# Patient Record
Sex: Female | Born: 1945 | Race: Black or African American | Hispanic: No | Smoking: Former smoker
Health system: Southern US, Community
[De-identification: ages and names within clinical notes are randomized; demographics above are authoritative.]

## PROBLEM LIST (undated history)

## (undated) DIAGNOSIS — M109 Gout, unspecified: Secondary | ICD-10-CM

## (undated) DIAGNOSIS — D564 Hereditary persistence of fetal hemoglobin [HPFH]: Secondary | ICD-10-CM

## (undated) DIAGNOSIS — D571 Sickle-cell disease without crisis: Secondary | ICD-10-CM

## (undated) DIAGNOSIS — E790 Hyperuricemia without signs of inflammatory arthritis and tophaceous disease: Secondary | ICD-10-CM

## (undated) HISTORY — PX: TOTAL ABDOMINAL HYSTERECTOMY W/ BILATERAL SALPINGOOPHORECTOMY: SHX83

## (undated) HISTORY — PX: CATARACT EXTRACTION, BILATERAL: SHX1313

## (undated) HISTORY — PX: HERNIA REPAIR: SHX51

## (undated) HISTORY — PX: CHOLECYSTECTOMY: SHX55

## (undated) HISTORY — PX: ABDOMINAL HYSTERECTOMY: SHX81

---

## 2015-11-17 ENCOUNTER — Telehealth: Payer: Self-pay | Admitting: Hematology and Oncology

## 2015-11-17 NOTE — Telephone Encounter (Signed)
Lt regarding being referred to Sick[facility] cell clinic,  new pt appt date, and the contact number for the clinic

## 2015-11-29 ENCOUNTER — Telehealth: Payer: Self-pay | Admitting: *Deleted

## 2015-11-29 NOTE — Telephone Encounter (Signed)
"  I'm calling for [facility] to schedule appointment with Dr. Alvy Bimler."  Call transferred to ext [phone].

## 2015-12-02 ENCOUNTER — Encounter (HOSPITAL_COMMUNITY): Payer: Self-pay | Admitting: Emergency Medicine

## 2015-12-02 ENCOUNTER — Emergency Department (HOSPITAL_COMMUNITY): Payer: MEDICARE

## 2015-12-02 ENCOUNTER — Inpatient Hospital Stay (HOSPITAL_COMMUNITY)
Admission: EM | Admit: 2015-12-02 | Discharge: 2015-12-06 | DRG: 812 | Disposition: A | Discharge status: Home / Self Care | Payer: MEDICARE | Attending: Internal Medicine | Admitting: Internal Medicine

## 2015-12-02 DIAGNOSIS — D649 Anemia, unspecified: Secondary | ICD-10-CM | POA: Diagnosis not present

## 2015-12-02 DIAGNOSIS — E875 Hyperkalemia: Secondary | ICD-10-CM | POA: Diagnosis present

## 2015-12-02 DIAGNOSIS — J9621 Acute and chronic respiratory failure with hypoxia: Secondary | ICD-10-CM | POA: Diagnosis not present

## 2015-12-02 DIAGNOSIS — R05 Cough: Secondary | ICD-10-CM

## 2015-12-02 DIAGNOSIS — R0609 Other forms of dyspnea: Secondary | ICD-10-CM | POA: Diagnosis not present

## 2015-12-02 DIAGNOSIS — N179 Acute kidney failure, unspecified: Secondary | ICD-10-CM | POA: Diagnosis present

## 2015-12-02 DIAGNOSIS — R0902 Hypoxemia: Secondary | ICD-10-CM | POA: Diagnosis present

## 2015-12-02 DIAGNOSIS — R748 Abnormal levels of other serum enzymes: Secondary | ICD-10-CM

## 2015-12-02 DIAGNOSIS — Z79899 Other long term (current) drug therapy: Secondary | ICD-10-CM

## 2015-12-02 DIAGNOSIS — D57819 Other sickle-cell disorders with crisis, unspecified: Secondary | ICD-10-CM | POA: Diagnosis not present

## 2015-12-02 DIAGNOSIS — D571 Sickle-cell disease without crisis: Secondary | ICD-10-CM | POA: Diagnosis present

## 2015-12-02 DIAGNOSIS — J9601 Acute respiratory failure with hypoxia: Secondary | ICD-10-CM | POA: Diagnosis not present

## 2015-12-02 DIAGNOSIS — D5700 Hb-SS disease with crisis, unspecified: Principal | ICD-10-CM | POA: Diagnosis present

## 2015-12-02 DIAGNOSIS — R0600 Dyspnea, unspecified: Secondary | ICD-10-CM | POA: Diagnosis not present

## 2015-12-02 DIAGNOSIS — D638 Anemia in other chronic diseases classified elsewhere: Secondary | ICD-10-CM | POA: Diagnosis not present

## 2015-12-02 HISTORY — DX: Gout, unspecified: M10.9

## 2015-12-02 HISTORY — DX: Sickle-cell disease without crisis: D57.1

## 2015-12-02 HISTORY — DX: Hyperuricemia without signs of inflammatory arthritis and tophaceous disease: E79.0

## 2015-12-02 HISTORY — DX: Hereditary persistence of fetal hemoglobin (HPFH): D56.4

## 2015-12-02 LAB — COMPREHENSIVE METABOLIC PANEL
ALBUMIN: 4 g/dL (ref 3.5–5.0)
ALK PHOS: 117 U/L (ref 38–126)
ALT: 23 U/L (ref 14–54)
AST: 41 U/L (ref 15–41)
Anion gap: 6 (ref 5–15)
BUN: 36 mg/dL — ABNORMAL HIGH (ref 6–20)
CALCIUM: 9.4 mg/dL (ref 8.9–10.3)
CHLORIDE: 116 mmol/L — AB (ref 101–111)
CO2: 17 mmol/L — AB (ref 22–32)
CREATININE: 1.31 mg/dL — AB (ref 0.44–1.00)
GFR calc Af Amer: 47 mL/min — ABNORMAL LOW (ref >60)
GFR calc non Af Amer: 40 mL/min — ABNORMAL LOW (ref >60)
GLUCOSE: 103 mg/dL — AB (ref 65–99)
Potassium: 4.3 mmol/L (ref 3.5–5.1)
SODIUM: 139 mmol/L (ref 135–145)
Total Bilirubin: 5.2 mg/dL — ABNORMAL HIGH (ref 0.3–1.2)
Total Protein: 6.2 g/dL — ABNORMAL LOW (ref 6.5–8.1)

## 2015-12-02 LAB — CBC WITH DIFFERENTIAL/PLATELET
BASOS PCT: 1 %
Basophils Absolute: 0.1 10*3/uL (ref 0.0–0.1)
EOS PCT: 3 %
Eosinophils Absolute: 0.3 10*3/uL (ref 0.0–0.7)
HEMATOCRIT: 17.2 % — AB (ref 36.0–46.0)
HEMOGLOBIN: 6.3 g/dL — AB (ref 12.0–15.0)
LYMPHS PCT: 28 %
Lymphs Abs: 2.8 10*3/uL (ref 0.7–4.0)
MCH: 33.5 pg (ref 26.0–34.0)
MCHC: 36.2 g/dL — ABNORMAL HIGH (ref 30.0–36.0)
MCV: 93 fL (ref 78.0–100.0)
MONO ABS: 1.3 10*3/uL — AB (ref 0.1–1.0)
Monocytes Relative: 13 %
NRBC: 11 /100{WBCs} — AB
Neutro Abs: 5.6 10*3/uL (ref 1.7–7.7)
Neutrophils Relative %: 55 %
Platelets: 217 10*3/uL (ref 150–400)
RBC: 1.85 MIL/uL — ABNORMAL LOW (ref 3.87–5.11)
RDW: 27.8 % — AB (ref 11.5–15.5)
WBC: 10.1 10*3/uL (ref 4.0–10.5)

## 2015-12-02 LAB — URINALYSIS, ROUTINE W REFLEX MICROSCOPIC
BILIRUBIN URINE: NEGATIVE
Glucose, UA: NEGATIVE mg/dL
Ketones, ur: NEGATIVE mg/dL
LEUKOCYTES UA: NEGATIVE
NITRITE: NEGATIVE
PH: 5.5 (ref 5.0–8.0)
Protein, ur: 100 mg/dL — AB
SPECIFIC GRAVITY, URINE: 1.012 (ref 1.005–1.030)

## 2015-12-02 LAB — I-STAT CG4 LACTIC ACID, ED: Lactic Acid, Venous: 0.52 mmol/L (ref 0.5–1.9)

## 2015-12-02 LAB — RETICULOCYTES: RBC.: 1.86 MIL/uL — ABNORMAL LOW (ref 3.87–5.11)

## 2015-12-02 LAB — URINE MICROSCOPIC-ADD ON: WBC, UA: NONE SEEN WBC/hpf (ref 0–5)

## 2015-12-02 LAB — PREPARE RBC (CROSSMATCH)

## 2015-12-02 MED ORDER — MORPHINE SULFATE (PF) 4 MG/ML IV SOLN
4.0000 mg | Freq: Once | INTRAVENOUS | Status: AC
  Administered 2015-12-02: 4 mg via INTRAVENOUS
  Filled 2015-12-02: qty 1

## 2015-12-02 MED ORDER — ALLOPURINOL 100 MG PO TABS
200.0000 mg | ORAL_TABLET | Freq: Every day | ORAL | Status: DC
Start: 2015-12-03 — End: 2015-12-06
  Administered 2015-12-03 – 2015-12-06 (×4): 200 mg via ORAL
  Filled 2015-12-02 (×5): qty 2

## 2015-12-02 MED ORDER — SODIUM CHLORIDE 0.9 % IV SOLN
Freq: Once | INTRAVENOUS | Status: AC
  Administered 2015-12-02: via INTRAVENOUS

## 2015-12-02 MED ORDER — SODIUM CHLORIDE 0.9 % IV SOLN
INTRAVENOUS | Status: DC
  Administered 2015-12-02: 18:00:00 via INTRAVENOUS

## 2015-12-02 MED ORDER — ASPIRIN EC 81 MG PO TBEC
81.0000 mg | DELAYED_RELEASE_TABLET | Freq: Once | ORAL | Status: AC
Start: 2015-12-02 — End: 2015-12-02
  Administered 2015-12-02: 81 mg via ORAL
  Filled 2015-12-02: qty 1

## 2015-12-02 MED ORDER — FOLIC ACID 1 MG PO TABS
1.0000 mg | ORAL_TABLET | Freq: Every day | ORAL | Status: DC
  Administered 2015-12-03 – 2015-12-06 (×4): 1 mg via ORAL
  Filled 2015-12-02 (×4): qty 1

## 2015-12-02 MED ORDER — HYDROMORPHONE HCL 1 MG/ML IJ SOLN
1.0000 mg | Freq: Once | INTRAMUSCULAR | Status: AC
Start: 2015-12-02 — End: 2015-12-02
  Administered 2015-12-02: 1 mg via INTRAVENOUS
  Filled 2015-12-02: qty 1

## 2015-12-02 MED ORDER — POLYETHYLENE GLYCOL 3350 17 G PO PACK: 17.0000 g | PACK | Freq: Every day | ORAL | Status: DC | PRN

## 2015-12-02 MED ORDER — GUAIFENESIN ER 600 MG PO TB12
600.0000 mg | ORAL_TABLET | Freq: Two times a day (BID) | ORAL | Status: DC
  Administered 2015-12-02 – 2015-12-06 (×8): 600 mg via ORAL
  Filled 2015-12-02 (×8): qty 1

## 2015-12-02 MED ORDER — INDOMETHACIN 25 MG PO CAPS
25.0000 mg | ORAL_CAPSULE | Freq: Three times a day (TID) | ORAL | Status: DC | PRN
  Filled 2015-12-02: qty 1

## 2015-12-02 MED ORDER — DEFERASIROX 360 MG PO TABS: 720.0000 mg | ORAL_TABLET | Freq: Every day | ORAL | Status: DC

## 2015-12-02 MED ORDER — DEFERASIROX 360 MG PO TABS
720.0000 mg | ORAL_TABLET | Freq: Every day | ORAL | Status: DC
  Administered 2015-12-04 – 2015-12-06 (×3): 720 mg via ORAL

## 2015-12-02 MED ORDER — SENNOSIDES-DOCUSATE SODIUM 8.6-50 MG PO TABS: 1.0000 | ORAL_TABLET | Freq: Two times a day (BID) | ORAL | Status: DC

## 2015-12-02 MED ORDER — DEFERASIROX 180 MG PO TABS
180.0000 mg | ORAL_TABLET | Freq: Every day | ORAL | Status: DC
  Administered 2015-12-04 – 2015-12-06 (×3): 180 mg via ORAL

## 2015-12-02 MED ORDER — OXYCODONE HCL ER 20 MG PO T12A
40.0000 mg | EXTENDED_RELEASE_TABLET | Freq: Two times a day (BID) | ORAL | Status: DC
  Administered 2015-12-02 – 2015-12-06 (×8): 40 mg via ORAL
  Filled 2015-12-02 (×8): qty 2

## 2015-12-02 MED ORDER — ADULT MULTIVITAMIN W/MINERALS CH
1.0000 | ORAL_TABLET | Freq: Every day | ORAL | Status: DC
  Administered 2015-12-03 – 2015-12-06 (×4): 1 via ORAL
  Filled 2015-12-02 (×4): qty 1

## 2015-12-02 MED ORDER — HYDROMORPHONE HCL 1 MG/ML IJ SOLN
1.0000 mg | INTRAMUSCULAR | Status: DC | PRN
Start: 2015-12-02 — End: 2015-12-03
  Administered 2015-12-02 – 2015-12-03 (×4): 1 mg via INTRAVENOUS
  Filled 2015-12-02 (×4): qty 1

## 2015-12-02 MED ORDER — ALBUTEROL SULFATE (2.5 MG/3ML) 0.083% IN NEBU
5.0000 mg | INHALATION_SOLUTION | Freq: Once | RESPIRATORY_TRACT | Status: DC
  Filled 2015-12-02 (×2): qty 6

## 2015-12-02 MED ORDER — SENNOSIDES-DOCUSATE SODIUM 8.6-50 MG PO TABS
1.0000 | ORAL_TABLET | Freq: Two times a day (BID) | ORAL | Status: DC
  Administered 2015-12-02 – 2015-12-06 (×8): 1 via ORAL
  Filled 2015-12-02 (×8): qty 1

## 2015-12-02 NOTE — ED Notes (Signed)
SPOKE WITH THE LABORATORY, THE PT'S BLOOD HAS ANTIBODIES, AND THE BLOOD WILL TAKE SOME TIME TO BE READY. ERA MADE AWARE.

## 2015-12-02 NOTE — ED Notes (Signed)
BLOOD CONSENT SIGNED AND WITNESSED.

## 2015-12-02 NOTE — ED Provider Notes (Signed)
CSN: [id number]     Arrival date & time 12/02/15  1300 History   First MD Initiated Contact with Patient 12/02/15 1324     Chief Complaint  Patient presents with  . Sickle Cell Pain Crisis  . Shortness of Breath     (Consider location/radiation/quality/duration/timing/severity/associated sxs/prior Treatment) HPI Comments: 70 year old female with history of fetal sickle cell disease presents complaining of shortness of breath 3 days. Patient has a history of anemia and states that her normal hemoglobin is about 8. She gets transfused about once every 6 weeks. Patient last transfused on May 9 due to hemoglobin of 6.4. She received 2 units at that time. States that she has lots  of antibodies. Denies any cough or congestion. No fever or chills. No abdominal discomfort. Does have some lower back discomfort as well as chest tightness. No medication taken for this prior to arrival  Patient is a 70 y.o. female presenting with sickle cell pain and shortness of breath. The history is provided by the patient and a relative.  Sickle Cell Pain Crisis Associated symptoms: shortness of breath   Shortness of Breath   Past Medical History  Diagnosis Date  . Sickle cell anemia (HCC)   . Gout    History reviewed. No pertinent past surgical history. No family history on file. Social History  Substance Use Topics  . Smoking status: Never Smoker   . Smokeless tobacco: None  . Alcohol Use: No   OB History    No data available     Review of Systems  Respiratory: Positive for shortness of breath.   All other systems reviewed and are negative.     Allergies  Review of patient's allergies indicates no known allergies.  Home Medications   Prior to Admission medications   Not on File   BP 138/57 mmHg  Pulse 100  Temp(Src) 99.2 F (37.3 C) (Oral)  Resp 26  SpO2 92% Physical Exam  Constitutional: She is oriented to person, place, and time. She appears well-developed and well-nourished.   Non-toxic appearance. No distress.  HENT:  Head: Normocephalic and atraumatic.  Eyes: Conjunctivae, EOM and lids are normal. Pupils are equal, round, and reactive to light.  Neck: Normal range of motion. Neck supple. No tracheal deviation present. No thyroid mass present.  Cardiovascular: Normal rate, regular rhythm and normal heart sounds.  Exam reveals no gallop.   No murmur heard. Pulmonary/Chest: Effort normal and breath sounds normal. No stridor. No respiratory distress. She has no decreased breath sounds. She has no wheezes. She has no rhonchi. She has no rales.  Abdominal: Soft. Normal appearance and bowel sounds are normal. She exhibits no distension. There is no tenderness. There is no rebound and no CVA tenderness.  Musculoskeletal: Normal range of motion. She exhibits no edema or tenderness.  Neurological: She is alert and oriented to person, place, and time. She has normal strength. No cranial nerve deficit or sensory deficit. GCS eye subscore is 4. GCS verbal subscore is 5. GCS motor subscore is 6.  Skin: Skin is warm and dry. No abrasion and no rash noted.  Psychiatric: She has a normal mood and affect. Her speech is normal and behavior is normal.  Nursing note and vitals reviewed.   ED Course  Procedures (including critical care time) Labs Review Labs Reviewed  CULTURE, BLOOD (ROUTINE X 2)  CULTURE, BLOOD (ROUTINE X 2)  URINE CULTURE  COMPREHENSIVE METABOLIC PANEL  CBC WITH DIFFERENTIAL/PLATELET  URINALYSIS, ROUTINE W REFLEX MICROSCOPIC (NOT AT  [facility])  I-STAT CG4 LACTIC ACID, ED  TYPE AND SCREEN    Imaging Review No results found. I have personally reviewed and evaluated these images and lab results as part of my medical decision-making.   EKG Interpretation   Date/Time:  Saturday December 02 2015 13:18:41 EDT Ventricular Rate:  94 PR Interval:    QRS Duration: 81 QT Interval:  372 QTC Calculation: 466 R Axis:   44 Text Interpretation:  Sinus rhythm Borderline T  abnormalities, anterior  leads Confirmed by Zenia Resides  MD, Tysean Vandervliet ([id number]) on 12/02/2015 2:02:41 PM      MDM   Final diagnoses:  None    Patient given IV morphine 2. Blood transfusions ordered. Will be admitted to the medicine service    Lacretia Leigh, MD 12/02/15 [phone]

## 2015-12-02 NOTE — ED Notes (Signed)
Patient transported to X-ray 

## 2015-12-02 NOTE — ED Notes (Signed)
Pt cannot use restroom at this time, aware urine specimen is needed.  

## 2015-12-02 NOTE — H&P (Signed)
History and Physical  Chelsea Hensley IHK:[id number] DOB: [birth year] DOA: 12/02/2015  Referring physician: EDP PCP: No primary care provider on file.   Chief Complaint: sickle cell crisis  HPI: Chelsea Hensley is a 70 y.o. female  Who was diagnosed with sickle cell disease when she was 34months old, she report she was last transfused on 5/9 when her hgb was 6.4. Today she presented to [facility] ED due to c/o sob for the last 3 days and low back pain, , oxycontin $RemoveBef'80mg'OzHHPqmEVr$  bid listed on her med lists, she reported she does not take it daily, she only take half a pill when she is in a lot of pain, she has not take any pain meds in the last three days, she report she has sickle cell crisis about 2-3 times a year and rarely needing pca pump. She does report started to have intermittent cough for the last week, denies sick contact, no fever. She is a life long nonsmoker, she is uptodate with her vaccinations.   ED course: cxr no acute findings, her o2 sats is in the low 90's, she does not appear in any distress, labs significant for hgb 6.3. Cr 1.31, tbili 5.2, retic 23, ua no infection, no prior baseline lab. She received iv morphin, two units of prbc ordered, hospitalist called to admit the patient.   Review of Systems:  Detail per HPI, Review of systems are otherwise negative  Past Medical History  Diagnosis Date  . Sickle cell anemia (HCC)   . Gout    History reviewed. No pertinent past surgical history. Social History:  reports that she has never smoked. She does not have any smokeless tobacco history on file. She reports that she does not drink alcohol or use illicit drugs. Patient lives at home & is able to participate in activities of daily living independently   Allergies  Allergen Reactions  . Quinolones Anaphylaxis    All vitals and pressures dropped, blacked out and had seizures    No family history on file.    Prior to Admission medications   Medication Sig Start Date End Date Taking?  Authorizing Provider  allopurinol (ZYLOPRIM) 100 MG tablet Take 200 mg by mouth daily.   Yes Historical Provider, MD  Deferasirox (JADENU) 180 MG TABS Take 180 mg by mouth daily. Take along with two 360 mg tablets to equal a total dose of 900 mg.   Yes Historical Provider, MD  Deferasirox (JADENU) 360 MG TABS Take 720 mg by mouth daily. Take with 180 mg tablet to equal a total dose of 900 mg.   Yes Historical Provider, MD  folic acid (FOLVITE) 1 MG tablet Take 1 mg by mouth daily.   Yes Historical Provider, MD  furosemide (LASIX) 20 MG tablet Take 20 mg by mouth daily as needed for fluid.   Yes Historical Provider, MD  indomethacin (INDOCIN) 25 MG capsule Take 25 mg by mouth 3 (three) times daily as needed (for gout flares).   Yes Historical Provider, MD  Multiple Vitamin (MULTIVITAMIN WITH MINERALS) TABS tablet Take 1 tablet by mouth daily.   Yes Historical Provider, MD  oxyCODONE (OXYCONTIN) 80 mg 12 hr tablet Take 80 mg by mouth every 12 (twelve) hours as needed (for severe pain).   Yes Historical Provider, MD  oxyCODONE-acetaminophen (PERCOCET/ROXICET) 5-325 MG tablet Take 1-2 tablets by mouth every 4 (four) hours as needed for moderate pain or severe pain.   Yes Historical Provider, MD    Physical Exam: BP 110/50 mmHg  Pulse 85  Temp(Src) 99.2 F (37.3 C) (Oral)  Resp 20  Ht $R'5\' 3"'VG$  (1.6 m)  Wt 66.225 kg (146 lb)  BMI 25.87 kg/m2  SpO2 93%  General:  Aaox3, NAD, very pleasant, mild juandice  Eyes: PERRL ENT: unremarkable Neck: supple, no JVD Cardiovascular: RRR Respiratory: CTABL Abdomen: soft/ND/ND, positive bowel sounds Skin: no rash Musculoskeletal:  No edema Psychiatric: calm/cooperative Neurologic: no focal findings            Labs on Admission:  Basic Metabolic Panel:  Recent Labs Lab 12/02/15 1350  NA 139  K 4.3  CL 116*  CO2 17*  GLUCOSE 103*  BUN 36*  CREATININE 1.31*  CALCIUM 9.4   Liver Function Tests:  Recent Labs Lab 12/02/15 1350  AST 41  ALT  23  ALKPHOS 117  BILITOT 5.2*  PROT 6.2*  ALBUMIN 4.0   No results for input(s): LIPASE, AMYLASE in the last 168 hours. No results for input(s): AMMONIA in the last 168 hours. CBC:  Recent Labs Lab 12/02/15 1350  WBC 10.1  NEUTROABS 5.6  HGB 6.3*  HCT 17.2*  MCV 93.0  PLT 217   Cardiac Enzymes: No results for input(s): CKTOTAL, CKMB, CKMBINDEX, TROPONINI in the last 168 hours.  BNP (last 3 results) No results for input(s): BNP in the last 8760 hours.  ProBNP (last 3 results) No results for input(s): PROBNP in the last 8760 hours.  CBG: No results for input(s): GLUCAP in the last 168 hours.  Radiological Exams on Admission: Dg Chest 2 View  12/02/2015  CLINICAL DATA:  Shortness of breath and chest pain. EXAM: CHEST  2 VIEW COMPARISON:  None FINDINGS: There is moderate cardiac enlargement. Asymmetric elevation of left hemidiaphragm is identified. Diffuse coarsened interstitial markings are identified bilaterally. Scarring noted within both lung bases, left greater than right. Diffuse abnormal increase sclerosis identified compatible with the clinical history of sickle cell disease. IMPRESSION: 1. Cardiac enlargement and chronic lung changes. No superimposed airspace consolidation noted. 2. Osseous change of sickle cell anemia. Electronically Signed   By: Kerby Moors M.D.   On: 12/02/2015 14:46    EKG: Independently reviewed. Sinus rhythm, no acute st/t changes, qtc wnl.  Assessment/Plan Present on Admission:  . Anemia  Sickle cell anemia/pain crisis:  pain mainly in her back,   Ivf/prbc transfusion/o2/incentive spiromter/stool regimen Pain management; patient requested to be put on $Remo'40mg'NhuNi$  oxycontin, she agreed to try iv dilaudid  And titrate for adequate pain control.  Cough: currently no chest pain, not in respiratory distress, cxr unremarkable, she dose has cough. Lung clear on exam,  Add mucinex, start abx if fever.  Cr elevation, ua no infection, no baseline labs,  continue hydration, repeat lab in am  DVT prophylaxis: scd's  Consultants: none  Code Status: full   Family Communication:  Patient and her daughter  Disposition Plan: admit to med tele  Time spent: 43mins  Gohan Collister MD, PhD [facility] Pager [phone] If 7PM-7AM, please contact night-coverage at [email or web address], password [facility]

## 2015-12-02 NOTE — ED Notes (Signed)
Allen aware of pt status and complaint.

## 2015-12-02 NOTE — ED Notes (Signed)
Allen at bedside assessing. States pt does not require breathing treatment at present time.

## 2015-12-02 NOTE — ED Notes (Signed)
Pt complaint of SOB, chest pain, and back pain related to [facility] onset 11/27/15.

## 2015-12-03 DIAGNOSIS — D5700 Hb-SS disease with crisis, unspecified: Principal | ICD-10-CM

## 2015-12-03 LAB — CBC
HEMATOCRIT: 21.2 % — AB (ref 36.0–46.0)
Hemoglobin: 7.5 g/dL — ABNORMAL LOW (ref 12.0–15.0)
MCH: 33.3 pg (ref 26.0–34.0)
MCHC: 35.4 g/dL (ref 30.0–36.0)
MCV: 94.2 fL (ref 78.0–100.0)
Platelets: 206 10*3/uL (ref 150–400)
RBC: 2.25 MIL/uL — ABNORMAL LOW (ref 3.87–5.11)
RDW: 23.8 % — AB (ref 11.5–15.5)
WBC: 9.4 10*3/uL (ref 4.0–10.5)

## 2015-12-03 LAB — COMPREHENSIVE METABOLIC PANEL
ALBUMIN: 4.3 g/dL (ref 3.5–5.0)
ALK PHOS: 106 U/L (ref 38–126)
ALT: 24 U/L (ref 14–54)
AST: 57 U/L — ABNORMAL HIGH (ref 15–41)
Anion gap: 5 (ref 5–15)
BUN: 38 mg/dL — AB (ref 6–20)
CALCIUM: 9.1 mg/dL (ref 8.9–10.3)
CHLORIDE: 114 mmol/L — AB (ref 101–111)
CO2: 18 mmol/L — AB (ref 22–32)
CREATININE: 1.26 mg/dL — AB (ref 0.44–1.00)
GFR calc Af Amer: 49 mL/min — ABNORMAL LOW (ref >60)
GFR calc non Af Amer: 42 mL/min — ABNORMAL LOW (ref >60)
Glucose, Bld: 103 mg/dL — ABNORMAL HIGH (ref 65–99)
Potassium: 5.6 mmol/L — ABNORMAL HIGH (ref 3.5–5.1)
Sodium: 137 mmol/L (ref 135–145)
Total Bilirubin: 5.1 mg/dL — ABNORMAL HIGH (ref 0.3–1.2)
Total Protein: 6.7 g/dL (ref 6.5–8.1)

## 2015-12-03 LAB — RETICULOCYTES: RBC.: 2.25 MIL/uL — ABNORMAL LOW (ref 3.87–5.11)

## 2015-12-03 LAB — LACTATE DEHYDROGENASE: LDH: 688 U/L — AB (ref 98–192)

## 2015-12-03 LAB — MAGNESIUM: Magnesium: 2.5 mg/dL — ABNORMAL HIGH (ref 1.7–2.4)

## 2015-12-03 MED ORDER — DIPHENHYDRAMINE HCL 12.5 MG/5ML PO ELIX: 12.5000 mg | ORAL_SOLUTION | Freq: Four times a day (QID) | ORAL | Status: DC | PRN

## 2015-12-03 MED ORDER — DEXTROSE-NACL 5-0.45 % IV SOLN
INTRAVENOUS | Status: DC
  Administered 2015-12-03 – 2015-12-04 (×2): via INTRAVENOUS

## 2015-12-03 MED ORDER — NALOXONE HCL 0.4 MG/ML IJ SOLN: 0.4000 mg | INTRAMUSCULAR | Status: DC | PRN

## 2015-12-03 MED ORDER — HYDROMORPHONE HCL 2 MG/ML IJ SOLN
2.0000 mg | INTRAMUSCULAR | Status: DC | PRN
  Administered 2015-12-03 – 2015-12-05 (×6): 2 mg via INTRAVENOUS
  Filled 2015-12-03 (×6): qty 1

## 2015-12-03 MED ORDER — DIPHENHYDRAMINE HCL 50 MG/ML IJ SOLN
12.5000 mg | Freq: Four times a day (QID) | INTRAMUSCULAR | Status: DC | PRN
Start: 2015-12-03 — End: 2015-12-04

## 2015-12-03 MED ORDER — ONDANSETRON HCL 4 MG/2ML IJ SOLN: 4.0000 mg | Freq: Four times a day (QID) | INTRAMUSCULAR | Status: DC | PRN

## 2015-12-03 MED ORDER — HYDROMORPHONE 1 MG/ML IV SOLN
INTRAVENOUS | Status: DC
  Administered 2015-12-03: 0.6 mg via INTRAVENOUS
  Administered 2015-12-03: 0.8 mg via INTRAVENOUS
  Administered 2015-12-03: 1 mg via INTRAVENOUS
  Administered 2015-12-03: 0.3 mg via INTRAVENOUS
  Administered 2015-12-04 (×2): 1.2 mg via INTRAVENOUS
  Administered 2015-12-04: 0.9 mg via INTRAVENOUS
  Administered 2015-12-04: 0 mg via INTRAVENOUS
  Filled 2015-12-03: qty 25

## 2015-12-03 MED ORDER — SODIUM CHLORIDE 0.9% FLUSH: 9.0000 mL | INTRAVENOUS | Status: DC | PRN

## 2015-12-03 MED ORDER — HYDROMORPHONE HCL 2 MG PO TABS
1.0000 mg | ORAL_TABLET | ORAL | Status: AC | PRN
  Administered 2015-12-03 (×2): 1 mg via ORAL
  Filled 2015-12-03 (×2): qty 1

## 2015-12-03 NOTE — Progress Notes (Signed)
Subjective: Patient is a 70 year old female with history of sickle cell disease who just moved down here from [place]. She is yet to establish primary care in the area. She is opiate nave and takes short acting opiates only. She has long-acting that she takes sporadically when the pain is severe. She was admitted with sickle cell painful crisis but also anemic crisis. Hemoglobin was 6.3 on admission. She has been ordered 2 units of packed red blood cells and has already received one unit. Pain is currently 6 out of 10. She has not been on Dilaudid PCA but was getting IV Dilaudid as needed. Denied any nausea vomiting or diarrhea.  Objective: Vital signs in last 24 hours: Temp:  [97.6 F (36.4 C)-99.2 F (37.3 C)] 97.7 F (36.5 C) (07/16 0115) Pulse Rate:  [76-111] 78 (07/16 0355) Resp:  [16-26] 18 (07/16 0355) BP: (106-138)/(50-74) 123/68 mmHg (07/16 0355) SpO2:  [78 %-97 %] 97 % (07/16 0355) Weight:  [66.225 kg (146 lb)] 66.225 kg (146 lb) (07/15 1412) Weight change:     Intake/Output from previous day: 07/15 0701 - 07/16 0700 In: 600 [I.V.:250; Blood:350] Out: 525 [Urine:525] Intake/Output this shift:    General appearance: alert, cooperative and no distress Neck: no adenopathy, no carotid bruit, no JVD, supple, symmetrical, trachea midline and thyroid not enlarged, symmetric, no tenderness/mass/nodules Back: symmetric, no curvature. ROM normal. No CVA tenderness. Resp: clear to auscultation bilaterally Cardio: regular rate and rhythm, S1, S2 normal, no murmur, click, rub or gallop GI: soft, non-tender; bowel sounds normal; no masses,  no organomegaly Extremities: extremities normal, atraumatic, no cyanosis or edema Pulses: 2+ and symmetric Skin: Skin color, texture, turgor normal. No rashes or lesions Neurologic: Grossly normal  Lab Results:  Recent Labs  12/02/15 1350 12/03/15 0636  WBC 10.1 9.4  HGB 6.3* 7.5*  HCT 17.2* 21.2*  PLT 217 206   BMET  Recent  Labs  12/02/15 1350 12/03/15 0636  NA 139 137  K 4.3 5.6*  CL 116* 114*  CO2 17* 18*  GLUCOSE 103* 103*  BUN 36* 38*  CREATININE 1.31* 1.26*  CALCIUM 9.4 9.1    Studies/Results: Dg Chest 2 View  12/02/2015  CLINICAL DATA:  Shortness of breath and chest pain. EXAM: CHEST  2 VIEW COMPARISON:  None FINDINGS: There is moderate cardiac enlargement. Asymmetric elevation of left hemidiaphragm is identified. Diffuse coarsened interstitial markings are identified bilaterally. Scarring noted within both lung bases, left greater than right. Diffuse abnormal increase sclerosis identified compatible with the clinical history of sickle cell disease. IMPRESSION: 1. Cardiac enlargement and chronic lung changes. No superimposed airspace consolidation noted. 2. Osseous change of sickle cell anemia. Electronically Signed   By: Kerby Moors M.D.   On: 12/02/2015 14:46    Medications: I have reviewed the patient's current medications.  Assessment/Plan: A 70 year old female with sickle cell painful crisis. 1. Sickle cell painful crisis: Patient is relatively new to [place]. I will initiate Dilaudid PCA. She already has physician assisted dosing. We will have to be careful with her age although she's been on pain medicine for long-term she is relatively nave. We'll consider Toradol but her creatinine is 1.26. We'll assess her pain as she uses the PCA overnight. Any further adjustment will be made at that point. 2. Sickle cell anemia: Patient's baseline hemoglobin apparently above 7. She is currently at 7.5 after transfusion of 1 unit of packed red blood cells. I will stuff transfusion of the second unit and monitor patient closely. No  evidence of significant hemolysis at this point. 3. Acute kidney injury: Creatinine is improving to 1.26 today. Continue gentle hydration and follow-up. 4. Hyperkalemia: This may indicate some level of hemolysis. I will try patient on Kayexalate and follow potassium level.  LOS: 1  day   GARBA,LAWAL 12/03/2015, 9:30 AM

## 2015-12-04 ENCOUNTER — Encounter (HOSPITAL_COMMUNITY): Payer: Self-pay | Admitting: Internal Medicine

## 2015-12-04 DIAGNOSIS — J9601 Acute respiratory failure with hypoxia: Secondary | ICD-10-CM

## 2015-12-04 DIAGNOSIS — R0600 Dyspnea, unspecified: Secondary | ICD-10-CM

## 2015-12-04 DIAGNOSIS — H0520 Unspecified exophthalmos: Secondary | ICD-10-CM

## 2015-12-04 DIAGNOSIS — D57819 Other sickle-cell disorders with crisis, unspecified: Secondary | ICD-10-CM

## 2015-12-04 DIAGNOSIS — D638 Anemia in other chronic diseases classified elsewhere: Secondary | ICD-10-CM

## 2015-12-04 DIAGNOSIS — D564 Hereditary persistence of fetal hemoglobin [HPFH]: Secondary | ICD-10-CM

## 2015-12-04 LAB — CBC WITH DIFFERENTIAL/PLATELET
BASOS ABS: 0 10*3/uL (ref 0.0–0.1)
Basophils Relative: 0 %
EOS ABS: 0.6 10*3/uL (ref 0.0–0.7)
Eosinophils Relative: 5 %
HCT: 18 % — ABNORMAL LOW (ref 36.0–46.0)
HEMOGLOBIN: 6.5 g/dL — AB (ref 12.0–15.0)
LYMPHS PCT: 26 %
Lymphs Abs: 3.1 10*3/uL (ref 0.7–4.0)
MCH: 33.5 pg (ref 26.0–34.0)
MCHC: 36.1 g/dL — ABNORMAL HIGH (ref 30.0–36.0)
MCV: 92.8 fL (ref 78.0–100.0)
MONO ABS: 1.2 10*3/uL — AB (ref 0.1–1.0)
MONOS PCT: 10 %
NEUTROS PCT: 59 %
Neutro Abs: 7.1 10*3/uL (ref 1.7–7.7)
PLATELETS: 168 10*3/uL (ref 150–400)
RBC: 1.94 MIL/uL — AB (ref 3.87–5.11)
RDW: 23.8 % — ABNORMAL HIGH (ref 11.5–15.5)
WBC: 12 10*3/uL — ABNORMAL HIGH (ref 4.0–10.5)
nRBC: 20 /100 WBC — ABNORMAL HIGH

## 2015-12-04 LAB — COMPREHENSIVE METABOLIC PANEL
ALBUMIN: 4.1 g/dL (ref 3.5–5.0)
ALK PHOS: 104 U/L (ref 38–126)
ALT: 19 U/L (ref 14–54)
AST: 44 U/L — AB (ref 15–41)
Anion gap: 4 — ABNORMAL LOW (ref 5–15)
BUN: 46 mg/dL — AB (ref 6–20)
CALCIUM: 9.4 mg/dL (ref 8.9–10.3)
CHLORIDE: 112 mmol/L — AB (ref 101–111)
CO2: 19 mmol/L — AB (ref 22–32)
CREATININE: 1.51 mg/dL — AB (ref 0.44–1.00)
GFR calc non Af Amer: 34 mL/min — ABNORMAL LOW (ref >60)
GFR, EST AFRICAN AMERICAN: 39 mL/min — AB (ref >60)
GLUCOSE: 118 mg/dL — AB (ref 65–99)
Potassium: 5.1 mmol/L (ref 3.5–5.1)
SODIUM: 135 mmol/L (ref 135–145)
Total Bilirubin: 4.5 mg/dL — ABNORMAL HIGH (ref 0.3–1.2)
Total Protein: 6.3 g/dL — ABNORMAL LOW (ref 6.5–8.1)

## 2015-12-04 LAB — RETICULOCYTES
RBC.: 1.91 MIL/uL — AB (ref 3.87–5.11)
Retic Ct Pct: >23 % — ABNORMAL HIGH (ref 0.4–3.1)

## 2015-12-04 LAB — URINE CULTURE: Culture: NO GROWTH

## 2015-12-04 MED ORDER — SODIUM CHLORIDE 0.9 % IV SOLN
25.0000 mg | INTRAVENOUS | Status: DC | PRN
  Filled 2015-12-04: qty 0.5

## 2015-12-04 MED ORDER — DIPHENHYDRAMINE HCL 25 MG PO CAPS: 25.0000 mg | ORAL_CAPSULE | ORAL | Status: DC | PRN

## 2015-12-04 MED ORDER — ONDANSETRON HCL 4 MG/2ML IJ SOLN: 4.0000 mg | Freq: Four times a day (QID) | INTRAMUSCULAR | Status: DC | PRN

## 2015-12-04 MED ORDER — HYDROMORPHONE 1 MG/ML IV SOLN
INTRAVENOUS | Status: DC
  Administered 2015-12-04: 1.7 mg via INTRAVENOUS
  Administered 2015-12-05: 0.8 mg via INTRAVENOUS
  Administered 2015-12-05: 1.6 mg via INTRAVENOUS
  Administered 2015-12-05: 0.8 mg via INTRAVENOUS

## 2015-12-04 MED ORDER — SODIUM CHLORIDE 0.9 % IV SOLN
INTRAVENOUS | Status: DC
  Administered 2015-12-04 – 2015-12-05 (×2): via INTRAVENOUS

## 2015-12-04 MED ORDER — NALOXONE HCL 0.4 MG/ML IJ SOLN: 0.4000 mg | INTRAMUSCULAR | Status: DC | PRN

## 2015-12-04 MED ORDER — SODIUM CHLORIDE 0.9% FLUSH: 9.0000 mL | INTRAVENOUS | Status: DC | PRN

## 2015-12-04 MED ORDER — SODIUM CHLORIDE 0.9 % IV BOLUS (SEPSIS)
1000.0000 mL | Freq: Once | INTRAVENOUS | Status: AC
  Administered 2015-12-04: 1000 mL via INTRAVENOUS

## 2015-12-04 NOTE — Progress Notes (Signed)
This RN made aware of 2nd unit of blood being ready in the blood bank. However, per Dr. Leontine Locket verbal orders, do not give unit until after am labs are drawn and he gives the okay. Will continue to monitor pt closely. Chelsea Hensley

## 2015-12-04 NOTE — Progress Notes (Signed)
CRITICAL VALUE ALERT  Critical value received:  Hgb 6.5  Date of notification:  12/04/2015  Time of notification:  0515  Critical value read back:Yes.    Nurse who received alert:  [name]   MD notified (1st page):  L. Harduck  Time of first page:  0516  MD Jonelle Sidle will be made aware today and will give more orders if transfusion is necessary. [name]

## 2015-12-04 NOTE — Progress Notes (Signed)
Chelsea Fantasia, NP notified of pt's temperature of 99.8. Will continue to monitor closely. [name]

## 2015-12-04 NOTE — Progress Notes (Signed)
[place] PROGRESS NOTE  Chelsea Hensley CZY:[id number] DOB: Oct 11, 1945 DOA: 12/02/2015 PCP: No primary care provider on file.  Assessment/Plan: Active Problems:   Anemia   Sickle cell anemia (HCC)  1. Hb SS (HPFH) with crisis: Pt presents with pain in low back which she states is characteristic of sickle cell crisis. She states that her pain is improved but is unable to give a level of intensity. She normally is able to control pain when at at level 5-6/10 with oral mediations and usually takes the Oxycontin only if it escalates above level of 7/10. I will continue the PCA at an adjusted dose to accommodate the PCA bolus dose. Continue IVF. She cannot receive any NSAID's due to her worsening renal failure. 2. DOE: Etiology unclear. Will obtain 2-D ECHO 3. Acute Kidney Injury: A review of her records from 09/27/2015 shows that her BUN was 21 and 1.10 at that time and is currently BUN 46 and Cr 1.51.  4. Hyperuricemia: Continue Allopurinol 5. Anemia of Chronic Disease: Pt is s/p transfusion of 1 unit of RBC's. From her records, it appears that her Hb is at baseline,. However unsure why she is not taking Hydrea the mainstay of therapy for pt's with sickle cell disease. Will continue to monitor her Hb and make further recommendations about transfusion 6. Hypoxemia: Pt currently requiring 2 L /min of Oxygen. Etiology unclear. Will check BNP and 2-D ECHO. 7. Proptosis: Will check TSH   Code Status: Full Code Family Communication: N/A Disposition Plan: Not yet ready for discharge  [place].  Pager [phone]. If 7PM-7AM, please contact night-coverage.  12/04/2015, 2:18 PM  LOS: 2 days   Interim History: Pt reports pain still not well controlled. She has used minimal doses of medication on the PCA.  Pt reports DOE as relates it to usually occurring when her Hb is low. However a review of her records shows that her Hb is almost always around 6.6. She denies any h/o renal disease. She  does report that she has multiple antibodies associated with her blood. However T&S Here shows antibodies only to C and e. Pt Kell negative  Consultants:  None  Procedures:  None  Antibiotics:  None   Objective: Filed Vitals:   12/04/15 0334 12/04/15 0337 12/04/15 0800 12/04/15 1200  BP: 120/66     Pulse: 85     Temp: 99.1 F (37.3 C)     TempSrc: Oral     Resp: $Remo'13 13 16 15  'NpLlY$ Height:      Weight:      SpO2: 93% 95% 97% 95%   Weight change:   Intake/Output Summary (Last 24 hours) at 12/04/15 1418 Last data filed at 12/04/15 0700  Gross per 24 hour  Intake 1415.83 ml  Output      0 ml  Net 1415.83 ml    General: Alert, awake, oriented x3, in moderately acute distress.  HEENT: [place]/AT PEERL, EOMI, anicteric, proptosis. Neck: Trachea midline,  no masses, no thyromegal,y no JVD, no carotid bruit OROPHARYNX:  Moist, No exudate/ erythema/lesions.  Heart: Regular rate and rhythm, without murmurs, rubs, gallops, PMI non-displaced, no heaves or thrills on palpation.  Lungs: Clear to auscultation, no wheezing or rhonchi noted. No increased vocal fremitus resonant to percussion  Abdomen: Soft, nontender, nondistended, positive bowel sounds, no masses no hepatosplenomegaly noted.  Neuro: No focal neurological deficits noted cranial nerves II through XII grossly intact.  Strength at baseline in bilateral upper and lower extremities. Musculoskeletal: No warm  swelling or erythema around joints, no spinal tenderness noted. Psychiatric: Patient alert and oriented x3, good insight and cognition, good recent to remote recall.    Data Reviewed: Basic Metabolic Panel:  Recent Labs Lab 12/02/15 1350 12/03/15 0636 12/04/15 0428  NA 139 137 135  K 4.3 5.6* 5.1  CL 116* 114* 112*  CO2 17* 18* 19*  GLUCOSE 103* 103* 118*  BUN 36* 38* 46*  CREATININE 1.31* 1.26* 1.51*  CALCIUM 9.4 9.1 9.4  MG  --  2.5*  --    Liver Function Tests:  Recent Labs Lab 12/02/15 1350  12/03/15 0636 12/04/15 0428  AST 41 57* 44*  ALT $Re'23 24 19  'XSX$ ALKPHOS 117 106 104  BILITOT 5.2* 5.1* 4.5*  PROT 6.2* 6.7 6.3*  ALBUMIN 4.0 4.3 4.1   No results for input(s): LIPASE, AMYLASE in the last 168 hours. No results for input(s): AMMONIA in the last 168 hours. CBC:  Recent Labs Lab 12/02/15 1350 12/03/15 0636 12/04/15 0428  WBC 10.1 9.4 12.0*  NEUTROABS 5.6  --  7.1  HGB 6.3* 7.5* 6.5*  HCT 17.2* 21.2* 18.0*  MCV 93.0 94.2 92.8  PLT 217 206 168   Cardiac Enzymes: No results for input(s): CKTOTAL, CKMB, CKMBINDEX, TROPONINI in the last 168 hours. BNP (last 3 results) No results for input(s): BNP in the last 8760 hours.  ProBNP (last 3 results) No results for input(s): PROBNP in the last 8760 hours.  CBG: No results for input(s): GLUCAP in the last 168 hours.  Recent Results (from the past 240 hour(s))  Culture, blood (Routine x 2)     Status: None (Preliminary result)   Collection Time: 12/02/15  1:50 PM  Result Value Ref Range Status   Specimen Description BLOOD BLOOD RIGHT FOREARM  Final   Special Requests BOTTLES DRAWN AEROBIC AND ANAEROBIC 5CC  Final   Culture   Final    NO GROWTH 2 DAYS Performed at [facility]    Report Status PENDING  Incomplete  Culture, blood (Routine x 2)     Status: None (Preliminary result)   Collection Time: 12/02/15  1:50 PM  Result Value Ref Range Status   Specimen Description BLOOD LEFT FOREARM  Final   Special Requests IN PEDIATRIC BOTTLE [place]  Final   Culture   Final    NO GROWTH 2 DAYS Performed at [facility]    Report Status PENDING  Incomplete  Urine culture     Status: None   Collection Time: 12/02/15  4:58 PM  Result Value Ref Range Status   Specimen Description URINE, RANDOM  Final   Special Requests NONE  Final   Culture NO GROWTH Performed at [facility]   Final   Report Status 12/04/2015 FINAL  Final     Studies: Dg Chest 2 View  12/02/2015  CLINICAL DATA:  Shortness of  breath and chest pain. EXAM: CHEST  2 VIEW COMPARISON:  None FINDINGS: There is moderate cardiac enlargement. Asymmetric elevation of left hemidiaphragm is identified. Diffuse coarsened interstitial markings are identified bilaterally. Scarring noted within both lung bases, left greater than right. Diffuse abnormal increase sclerosis identified compatible with the clinical history of sickle cell disease. IMPRESSION: 1. Cardiac enlargement and chronic lung changes. No superimposed airspace consolidation noted. 2. Osseous change of sickle cell anemia. Electronically Signed   By: Kerby Moors M.D.   On: 12/02/2015 14:46    Scheduled Meds: . allopurinol  200 mg Oral Daily  . Deferasirox  180 mg Oral Daily  . Deferasirox  720 mg Oral Daily  . folic acid  1 mg Oral Daily  . guaiFENesin  600 mg Oral BID  . HYDROmorphone   Intravenous Q4H  . multivitamin with minerals  1 tablet Oral Daily  . oxyCODONE  40 mg Oral Q12H  . senna-docusate  1 tablet Oral BID   Continuous Infusions: . dextrose 5 % and 0.45% NaCl 50 mL/hr at 12/04/15 0507    Active Problems:   Anemia   Sickle cell anemia (HCC)  Time spent 45 minutes. Greater than 50% involved face to face contact with the patient for assessment, counseling and coordination of care.

## 2015-12-05 ENCOUNTER — Inpatient Hospital Stay (HOSPITAL_COMMUNITY): Payer: MEDICARE

## 2015-12-05 DIAGNOSIS — J9621 Acute and chronic respiratory failure with hypoxia: Secondary | ICD-10-CM

## 2015-12-05 DIAGNOSIS — N179 Acute kidney failure, unspecified: Secondary | ICD-10-CM

## 2015-12-05 DIAGNOSIS — D649 Anemia, unspecified: Secondary | ICD-10-CM

## 2015-12-05 DIAGNOSIS — R0609 Other forms of dyspnea: Secondary | ICD-10-CM

## 2015-12-05 DIAGNOSIS — R0600 Dyspnea, unspecified: Secondary | ICD-10-CM

## 2015-12-05 LAB — CBC WITH DIFFERENTIAL/PLATELET
BASOS ABS: 0.1 10*3/uL (ref 0.0–0.1)
Basophils Relative: 1 %
EOS ABS: 0.2 10*3/uL (ref 0.0–0.7)
EOS PCT: 2 %
HEMATOCRIT: 18.1 % — AB (ref 36.0–46.0)
Hemoglobin: 6.5 g/dL — CL (ref 12.0–15.0)
LYMPHS ABS: 2.6 10*3/uL (ref 0.7–4.0)
Lymphocytes Relative: 22 %
MCH: 32.8 pg (ref 26.0–34.0)
MCHC: 35.9 g/dL (ref 30.0–36.0)
MCV: 91.4 fL (ref 78.0–100.0)
MONO ABS: 1.3 10*3/uL — AB (ref 0.1–1.0)
Monocytes Relative: 11 %
NEUTROS PCT: 64 %
Neutro Abs: 7.8 10*3/uL — ABNORMAL HIGH (ref 1.7–7.7)
PLATELETS: 200 10*3/uL (ref 150–400)
RBC: 1.98 MIL/uL — AB (ref 3.87–5.11)
RDW: 24.2 % — AB (ref 11.5–15.5)
WBC: 12 10*3/uL — AB (ref 4.0–10.5)
nRBC: 21 /100 WBC — ABNORMAL HIGH

## 2015-12-05 LAB — BASIC METABOLIC PANEL
Anion gap: 3 — ABNORMAL LOW (ref 5–15)
BUN: 42 mg/dL — ABNORMAL HIGH (ref 6–20)
CHLORIDE: 117 mmol/L — AB (ref 101–111)
CO2: 19 mmol/L — ABNORMAL LOW (ref 22–32)
CREATININE: 1.1 mg/dL — AB (ref 0.44–1.00)
Calcium: 9.8 mg/dL (ref 8.9–10.3)
GFR calc non Af Amer: 50 mL/min — ABNORMAL LOW (ref >60)
GFR, EST AFRICAN AMERICAN: 58 mL/min — AB (ref >60)
Glucose, Bld: 124 mg/dL — ABNORMAL HIGH (ref 65–99)
POTASSIUM: 4.6 mmol/L (ref 3.5–5.1)
SODIUM: 139 mmol/L (ref 135–145)

## 2015-12-05 LAB — ECHOCARDIOGRAM COMPLETE
Height: 63 in
WEIGHTICAEL: 2336 [oz_av]

## 2015-12-05 LAB — PREPARE RBC (CROSSMATCH)

## 2015-12-05 LAB — TSH: TSH: 0.642 u[IU]/mL (ref 0.350–4.500)

## 2015-12-05 MED ORDER — HYDROMORPHONE HCL 1 MG/ML IJ SOLN
1.0000 mg | INTRAMUSCULAR | Status: DC | PRN
  Administered 2015-12-05 – 2015-12-06 (×5): 1 mg via INTRAVENOUS
  Filled 2015-12-05 (×5): qty 1

## 2015-12-05 MED ORDER — FUROSEMIDE 10 MG/ML IJ SOLN
20.0000 mg | Freq: Once | INTRAMUSCULAR | Status: AC
Start: 2015-12-05 — End: 2015-12-05
  Administered 2015-12-05: 20 mg via INTRAVENOUS
  Filled 2015-12-05: qty 2

## 2015-12-05 MED ORDER — SODIUM CHLORIDE 0.9 % IV SOLN
Freq: Once | INTRAVENOUS | Status: AC
  Administered 2015-12-05: 12:00:00 via INTRAVENOUS

## 2015-12-05 NOTE — Progress Notes (Signed)
[facility] PROGRESS NOTE  Chelsea Hensley PPI:[id number] DOB: 12/17/1945 DOA: 12/02/2015 PCP: No primary care provider on file.  Assessment/Plan: Active Problems:   Anemia   Sickle cell anemia (HCC)  1. Symptomatic Anemia: Pt having increased work of breathing and still hypoxic. I suspect that she is intolerant to lower Hb from a Cardiac standpoint. This is an indication for transfusion so will transfuse 1 unit RBC's and re-evaluate. Hb stable from yesterday to today. 2. DOE: Etiology unclear. Awaiting 2-D ECHO. Will re-evaluate after transfusion. 3. Acute Kidney Injury: A review of her records from 09/27/2015 shows that her BUN was 21 and 1.10 at that time. She has had improvement after discontinuing Toradol and increasing IVF. Currently BUN 42 and Cr 1.10. Will also check LDH. 4. Hb SS with HPFH with crisis: Will continue oral analgesics and add intermittent clinician assisted doses. Pt unable to receive Toradol secondary to AKI. 5. Hyperuricemia: Continue Allopurinol 6. Anemia of Chronic Disease: Pt is s/p transfusion of 1 unit of RBC's. From her records, it appears that her Hb is at baseline,. However unsure why she is not taking Hydrea the mainstay of therapy for pt's with sickle cell disease. Will continue to monitor her Hb and make further recommendations about transfusion 7. Hypoxemia: Pt currently requiring 2 L /min of Oxygen. Etiology unclear. Will check BNP and 2-D ECHO. 8. Proptosis:  TSH normal.   Code Status: Full Code Family Communication: N/A Disposition Plan: Not yet ready for discharge  [place].  Pager [phone]. If 7PM-7AM, please contact night-coverage.  12/05/2015, 10:31 AM  LOS: 3 days   Interim History: Pt reports pain still not well controlled and rates it 7/10. She has used minimal doses of medication on the PCA 4.1 mg with 39/11:demands/deliveries.  Pt reports DOE as relates it to usually occurring when her Hb is low. However a review of her records  shows that her Hb is almost always around 6.6. She denies any h/o renal disease. She does report that she has multiple antibodies associated with her blood. However T&S Here shows antibodies only to C and e. Pt Kell negative  Consultants:  None  Procedures:  None  Antibiotics:  None   Objective: Filed Vitals:   12/05/15 0130 12/05/15 0153 12/05/15 0414 12/05/15 0830  BP:  137/57 120/90   Pulse:  88 92   Temp:  98.5 F (36.9 C) 99.2 F (37.3 C)   TempSrc:  Oral Oral   Resp: $Remo'15 18 13 15  'GuQAE$ Height:      Weight:      SpO2: 92% 93% 96% 97%   Weight change:   Intake/Output Summary (Last 24 hours) at 12/05/15 1031 Last data filed at 12/05/15 0700  Gross per 24 hour  Intake 2380.83 ml  Output      0 ml  Net 2380.83 ml    General: Alert, awake, oriented x3, in mild distress secondary to pain and DOE.  HEENT: [place]/AT PEERL, EOMI, anicteric, proptosis. Neck: Trachea midline,  no masses, no thyromegal,y no JVD, no carotid bruit OROPHARYNX:  Moist, No exudate/ erythema/lesions.  Heart: Regular rate and rhythm, without murmurs, rubs, gallops, PMI non-displaced, no heaves or thrills on palpation.  Lungs: Mild crackles and intermittent wheezing at bases No increased vocal fremitus resonant to percussion  Abdomen: Soft, nontender, nondistended, positive bowel sounds, no masses no hepatosplenomegaly noted.  Neuro: No focal neurological deficits noted cranial nerves II through XII grossly intact.  Strength at baseline in bilateral upper and lower  extremities. Musculoskeletal: No warm swelling or erythema around joints, no spinal tenderness noted. Psychiatric: Patient alert and oriented x3, good insight and cognition, good recent to remote recall.    Data Reviewed: Basic Metabolic Panel:  Recent Labs Lab 12/02/15 1350 12/03/15 0636 12/04/15 0428 12/05/15 0434  NA 139 137 135 139  K 4.3 5.6* 5.1 4.6  CL 116* 114* 112* 117*  CO2 17* 18* 19* 19*  GLUCOSE 103* 103* 118* 124*   BUN 36* 38* 46* 42*  CREATININE 1.31* 1.26* 1.51* 1.10*  CALCIUM 9.4 9.1 9.4 9.8  MG  --  2.5*  --   --    Liver Function Tests:  Recent Labs Lab 12/02/15 1350 12/03/15 0636 12/04/15 0428  AST 41 57* 44*  ALT $Re'23 24 19  'Riv$ ALKPHOS 117 106 104  BILITOT 5.2* 5.1* 4.5*  PROT 6.2* 6.7 6.3*  ALBUMIN 4.0 4.3 4.1   No results for input(s): LIPASE, AMYLASE in the last 168 hours. No results for input(s): AMMONIA in the last 168 hours. CBC:  Recent Labs Lab 12/02/15 1350 12/03/15 0636 12/04/15 0428 12/05/15 0434  WBC 10.1 9.4 12.0* 12.0*  NEUTROABS 5.6  --  7.1 7.8*  HGB 6.3* 7.5* 6.5* 6.5*  HCT 17.2* 21.2* 18.0* 18.1*  MCV 93.0 94.2 92.8 91.4  PLT 217 206 168 200   Cardiac Enzymes: No results for input(s): CKTOTAL, CKMB, CKMBINDEX, TROPONINI in the last 168 hours. BNP (last 3 results) No results for input(s): BNP in the last 8760 hours.  ProBNP (last 3 results) No results for input(s): PROBNP in the last 8760 hours.  CBG: No results for input(s): GLUCAP in the last 168 hours.  Recent Results (from the past 240 hour(s))  Culture, blood (Routine x 2)     Status: None (Preliminary result)   Collection Time: 12/02/15  1:50 PM  Result Value Ref Range Status   Specimen Description BLOOD BLOOD RIGHT FOREARM  Final   Special Requests BOTTLES DRAWN AEROBIC AND ANAEROBIC 5CC  Final   Culture   Final    NO GROWTH 2 DAYS Performed at [facility]    Report Status PENDING  Incomplete  Culture, blood (Routine x 2)     Status: None (Preliminary result)   Collection Time: 12/02/15  1:50 PM  Result Value Ref Range Status   Specimen Description BLOOD LEFT FOREARM  Final   Special Requests IN PEDIATRIC BOTTLE [place]  Final   Culture   Final    NO GROWTH 2 DAYS Performed at [facility]    Report Status PENDING  Incomplete  Urine culture     Status: None   Collection Time: 12/02/15  4:58 PM  Result Value Ref Range Status   Specimen Description URINE, RANDOM  Final    Special Requests NONE  Final   Culture NO GROWTH Performed at [facility]   Final   Report Status 12/04/2015 FINAL  Final     Studies: Dg Chest 2 View  12/02/2015  CLINICAL DATA:  Shortness of breath and chest pain. EXAM: CHEST  2 VIEW COMPARISON:  None FINDINGS: There is moderate cardiac enlargement. Asymmetric elevation of left hemidiaphragm is identified. Diffuse coarsened interstitial markings are identified bilaterally. Scarring noted within both lung bases, left greater than right. Diffuse abnormal increase sclerosis identified compatible with the clinical history of sickle cell disease. IMPRESSION: 1. Cardiac enlargement and chronic lung changes. No superimposed airspace consolidation noted. 2. Osseous change of sickle cell anemia. Electronically Signed  By: Kerby Moors M.D.   On: 12/02/2015 14:46    Scheduled Meds: . sodium chloride   Intravenous Once  . allopurinol  200 mg Oral Daily  . Deferasirox  180 mg Oral Daily  . Deferasirox  720 mg Oral Daily  . folic acid  1 mg Oral Daily  . furosemide  20 mg Intravenous Once  . guaiFENesin  600 mg Oral BID  . HYDROmorphone   Intravenous Q4H  . multivitamin with minerals  1 tablet Oral Daily  . oxyCODONE  40 mg Oral Q12H  . senna-docusate  1 tablet Oral BID   Continuous Infusions: . sodium chloride 125 mL/hr at 12/05/15 0416    Time spent 25 minutes. Greater than 50% involved face to face contact with the patient for assessment, counseling and coordination of care.

## 2015-12-05 NOTE — Progress Notes (Signed)
  Echocardiogram 2D Echocardiogram has been performed.  Bobbye Charleston 12/05/2015, 1:31 PM

## 2015-12-05 NOTE — Care Management Important Message (Signed)
Important Message  Patient Details  Name: Aava Deland MRN: [id number] Date of Birth: 09-24-1945   Medicare Important Message Given:  Yes    Camillo Flaming 12/05/2015, 10:06 AMImportant Message  Patient Details  Name: [name] MRN: [id number] Date of Birth: [birth year]   Medicare Important Message Given:  Yes    Camillo Flaming 12/05/2015, 10:06 AM

## 2015-12-05 NOTE — Progress Notes (Signed)
CSW received inappropriate consult for "may need financial assistance with prescription medications Also patient is new to the area and because of her Sickle Cell Diagnosis she needs to find a Hematologist- has not had any success yet". RNCM, Cookie made aware.   No further CSW needs identified - CSW signing off.   Raynaldo Opitz, [facility] Clinical Social Worker cell #: [phone]

## 2015-12-06 LAB — TYPE AND SCREEN
ABO/RH(D): B POS
ANTIBODY SCREEN: NEGATIVE
Unit division: 0
Unit division: 0

## 2015-12-06 LAB — CBC WITH DIFFERENTIAL/PLATELET
BASOS PCT: 1 %
Basophils Absolute: 0.1 10*3/uL (ref 0.0–0.1)
EOS ABS: 0.6 10*3/uL (ref 0.0–0.7)
Eosinophils Relative: 7 %
HCT: 22.6 % — ABNORMAL LOW (ref 36.0–46.0)
HEMOGLOBIN: 8 g/dL — AB (ref 12.0–15.0)
Lymphocytes Relative: 25 %
Lymphs Abs: 2.3 10*3/uL (ref 0.7–4.0)
MCH: 32.5 pg (ref 26.0–34.0)
MCHC: 35.4 g/dL (ref 30.0–36.0)
MCV: 91.9 fL (ref 78.0–100.0)
MONO ABS: 1.4 10*3/uL — AB (ref 0.1–1.0)
MONOS PCT: 15 %
NEUTROS ABS: 4.6 10*3/uL (ref 1.7–7.7)
NRBC: 25 /100{WBCs} — AB
Neutrophils Relative %: 52 %
Platelets: 186 10*3/uL (ref 150–400)
RBC: 2.46 MIL/uL — ABNORMAL LOW (ref 3.87–5.11)
RDW: 21.9 % — ABNORMAL HIGH (ref 11.5–15.5)
WBC: 9 10*3/uL (ref 4.0–10.5)

## 2015-12-06 LAB — BASIC METABOLIC PANEL
Anion gap: 3 — ABNORMAL LOW (ref 5–15)
BUN: 33 mg/dL — ABNORMAL HIGH (ref 6–20)
CHLORIDE: 117 mmol/L — AB (ref 101–111)
CO2: 21 mmol/L — AB (ref 22–32)
CREATININE: 1.07 mg/dL — AB (ref 0.44–1.00)
Calcium: 10.3 mg/dL (ref 8.9–10.3)
GFR calc non Af Amer: 51 mL/min — ABNORMAL LOW (ref >60)
GFR, EST AFRICAN AMERICAN: 60 mL/min — AB (ref >60)
Glucose, Bld: 96 mg/dL (ref 65–99)
POTASSIUM: 4.6 mmol/L (ref 3.5–5.1)
SODIUM: 141 mmol/L (ref 135–145)

## 2015-12-06 LAB — RETICULOCYTES
RBC.: 2.46 MIL/uL — ABNORMAL LOW (ref 3.87–5.11)
RETIC CT PCT: 22.5 % — AB (ref 0.4–3.1)
Retic Count, Absolute: 553.5 10*3/uL — ABNORMAL HIGH (ref 19.0–186.0)

## 2015-12-06 NOTE — Discharge Summary (Signed)
Chelsea Hensley MRN: [id number] DOB/AGE: May 27, 1945 69 y.o.  Admit date: 12/02/2015 Discharge date: 12/06/2015  Primary Care Physician:  No primary care provider on file.   Discharge Diagnoses:   Patient Active Problem List   Diagnosis Date Noted  . Anemia 12/02/2015  . Sickle cell anemia (HCC) 12/02/2015    DISCHARGE MEDICATION:   Medication List    TAKE these medications        allopurinol 100 MG tablet  Commonly known as:  ZYLOPRIM  Take 200 mg by mouth daily.     folic acid 1 MG tablet  Commonly known as:  FOLVITE  Take 1 mg by mouth daily.     furosemide 20 MG tablet  Commonly known as:  LASIX  Take 20 mg by mouth daily as needed for fluid.     indomethacin 25 MG capsule  Commonly known as:  INDOCIN  Take 25 mg by mouth 3 (three) times daily as needed (for gout flares).     JADENU 180 MG Tabs  Generic drug:  Deferasirox  Take 180 mg by mouth daily. Take along with two 360 mg tablets to equal a total dose of 900 mg.     JADENU 360 MG Tabs  Generic drug:  Deferasirox  Take 720 mg by mouth daily. Take with 180 mg tablet to equal a total dose of 900 mg.     multivitamin with minerals Tabs tablet  Take 1 tablet by mouth daily.     oxyCODONE-acetaminophen 5-325 MG tablet  Commonly known as:  PERCOCET/ROXICET  Take 1-2 tablets by mouth every 4 (four) hours as needed for moderate pain or severe pain.     OXYCONTIN 80 mg 12 hr tablet  Generic drug:  oxyCODONE  Take 80 mg by mouth every 12 (twelve) hours as needed (for severe pain).          Consults:     SIGNIFICANT DIAGNOSTIC STUDIES:  Dg Chest 2 View  12/02/2015  CLINICAL DATA:  Shortness of breath and chest pain. EXAM: CHEST  2 VIEW COMPARISON:  None FINDINGS: There is moderate cardiac enlargement. Asymmetric elevation of left hemidiaphragm is identified. Diffuse coarsened interstitial markings are identified bilaterally. Scarring noted within both lung bases, left greater than right. Diffuse abnormal  increase sclerosis identified compatible with the clinical history of sickle cell disease. IMPRESSION: 1. Cardiac enlargement and chronic lung changes. No superimposed airspace consolidation noted. 2. Osseous change of sickle cell anemia. Electronically Signed   By: Kerby Moors M.D.   On: 12/02/2015 14:46     ECHO:   Left ventricle: The cavity size was normal. Systolic function was  normal. The estimated ejection fraction was in the range of 60%  to 65%. Wall motion was normal; there were no regional wall  motion abnormalities. The transmitral flow pattern was normal.  Left ventricular diastolic function parameters were normal. - Aortic valve: Trileaflet; normal thickness, mildly calcified  leaflets. There was very mild stenosis. - Mitral valve: There was mild regurgitation. - Pulmonic valve: There was mild regurgitation. - Pulmonary arteries: PA peak pressure: 38 mm Hg (S).  Impressions:  - The right ventricular systolic pressure was increased consistent  with mild pulmonary hypertension.   Recent Results (from the past 240 hour(s))  Culture, blood (Routine x 2)     Status: None (Preliminary result)   Collection Time: 12/02/15  1:50 PM  Result Value Ref Range Status   Specimen Description BLOOD BLOOD RIGHT FOREARM  Final   Special Requests BOTTLES  DRAWN AEROBIC AND ANAEROBIC 5CC  Final   Culture   Final    NO GROWTH 3 DAYS Performed at [facility]    Report Status PENDING  Incomplete  Culture, blood (Routine x 2)     Status: None (Preliminary result)   Collection Time: 12/02/15  1:50 PM  Result Value Ref Range Status   Specimen Description BLOOD LEFT FOREARM  Final   Special Requests IN PEDIATRIC BOTTLE [place]  Final   Culture   Final    NO GROWTH 3 DAYS Performed at [facility]    Report Status PENDING  Incomplete  Urine culture     Status: None   Collection Time: 12/02/15  4:58 PM  Result Value Ref Range Status   Specimen Description URINE,  RANDOM  Final   Special Requests NONE  Final   Culture NO GROWTH Performed at [facility]   Final   Report Status 12/04/2015 FINAL  Final    BRIEF ADMITTING H & P: Chelsea Hensley is a 70 y.o. female  Who was diagnosed with sickle cell disease when she was 16months old, she report she was last transfused on 5/9 when her hgb was 6.4. Today she presented to [facility] ED due to c/o sob for the last 3 days and low back pain, , oxycontin $RemoveBef'80mg'OzeKUObGiZ$  bid listed on her med lists, she reported she does not take it daily, she only take half a pill when she is in a lot of pain, she has not take any pain meds in the last three days, she report she has sickle cell crisis about 2-3 times a year and rarely needing pca pump. She does report started to have intermittent cough for the last week, denies sick contact, no fever. She is a life long nonsmoker, she is uptodate with her vaccinations.   ED course: cxr no acute findings, her o2 sats is in the low 90's, she does not appear in any distress, labs significant for hgb 6.3. Cr 1.31, tbili 5.2, retic 23, ua no infection, no prior baseline lab. She received iv morphin, two units of prbc ordered, hospitalist called to admit the patient.   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 The patient is new to our system in this area and we have no medical records that either confirm her genotype or sheds any light on her normal indices. Nevertheless the history given indicates that she is opiate nave. The patient was started on a full dose PCA in addition to her long-acting medications. As her pain improved she was transitioned to oral medications. As a time of discharge the patient reports her pain is about 5-6 out of 10 and states that this is the level but she can manage her pain at home with oral medications. I did speak with a nurse in Dr. Melburn Hake office and made a request for medical records. Once medical records arrived there was a line in the records that states the patient had homozygous sickle cell with fetal hemoglobin. The patient does have an appointment to initiate care for the sickle cell [facility] on 01/08/2016 which is the earliest available appointment. I have ordered a hemoglobin electrophoresis at this time they can be followed up at her outpatient appointment. The patient does share that she has never been on hydroxyurea and does not know what that medication is. A list of  her medications received from her physician's office does confirm that she is not on hydroxyurea. However she does have a history of multiple transfusions and she does articulate that her threshold for transfusion has been a hematocrit of 18. So with regard to her anemia the patient came in with a hemoglobin/hematocrit level of 6.3/17.2. She was symptomatic with difficulty breathing, hypoxemia and increased work of breathing. The patient received a transfusion of a total of 2 units of red blood cells at the time of discharge her hemoglobin was at 8 with a hematocrit of 22.6. Her work of breathing has decreased significantly after the transfusion. However she still have some mild degree of hypoxemia. A 2-D echocardiogram was performed to evaluate for pulmonary hypertension or congestive heart failure in this patient. It showed that she had a normal ejection fraction, no evidence of diastolic dysfunction and only a mild increase in pulmonary artery pressure. Upon ambulating the patient the nurse was concerned about her safety mobility. Physical therapy evaluation was initiated and the recommendations is that the patient should ambulate with a cane which she has at home. There is no further recommendations for continued therapy or other durable medical equipment. The patient is discharged in good condition.    Disposition and Follow-up:  Patient is discharged  home in good condition. She has an appointment to establish care for [facility] care [facility] on [facility]2016. The patient is requesting a referral to hematology and I will defer that to Dr. Hyman Hopes and staff when she seen in the [facility].   DISCHARGE EXAM:  General: Alert, awake, oriented x3, in no apparent distress.  HEENT: [place]/AT PEERL, EOMI, anicteric Neck: Trachea midline, no masses, no thyromegal,y no JVD, no carotid bruit OROPHARYNX: Moist, No exudate/ erythema/lesions.  Heart: Regular rate and rhythm, without murmurs, rubs, gallops or S3. PMI non-displaced. Exam reveals no decreased pulses. Pulmonary/Chest: Normal effort. Breath sounds normal. No. Apnea. Clear to auscultation,no stridor,  no wheezing and no rhonchi noted. No respiratory distress and no tenderness noted. Abdomen: Soft, nontender, nondistended, normal bowel sounds, no masses no hepatosplenomegaly noted. No fluid wave and no ascites. There is no guarding or rebound. Neuro: Alert and oriented to person, place and time. Normal motor skills, Displays no atrophy or tremors and exhibits normal muscle tone.  No focal neurological deficits noted cranial nerves II through XII grossly intact. No sensory deficit noted. Strength at baseline in bilateral upper and lower extremities. Gait normal. Musculoskeletal: No warm swelling or erythema around joints, no spinal tenderness noted. Psychiatric: Patient alert and oriented x3, good insight and cognition, good recent to remote recall. Mood, memory, affect and judgement normal Lymph node survey: No cervical axillary or inguinal lymphadenopathy noted. Skin: Skin is warm and dry. No bruising, no ecchymosis and no rash noted. Pt is not diaphoretic. No erythema. No pallor    Blood pressure 127/53, pulse 84, temperature 99.3 F (37.4 C), temperature source Oral, resp. rate 18, height 5\' 3"  (1.6 m), weight 146 lb (66.225 kg), SpO2 92 %.   Recent Labs   12/05/15 0434 12/06/15 0438  NA 139 141  K 4.6 4.6  CL 117* 117*  CO2 19* 21*  GLUCOSE 124* 96  BUN 42* 33*  CREATININE 1.10* 1.07*  CALCIUM 9.8 10.3    Recent Labs  12/04/15 0428  AST 44*  ALT 19  ALKPHOS 104  BILITOT 4.5*  PROT 6.3*  ALBUMIN 4.1   No results for input(s): LIPASE, AMYLASE in the  last 72 hours.  Recent Labs  12/05/15 0434 12/06/15 0438  WBC 12.0* 9.0  NEUTROABS 7.8* 4.6  HGB 6.5* 8.0*  HCT 18.1* 22.6*  MCV 91.4 91.9  PLT 200 186     Total time spent including face to face and decision making was greater than 30 minutes  Signed: Tierre Gerard A. 12/06/2015, 11:03 AM

## 2015-12-06 NOTE — Progress Notes (Signed)
SATURATION QUALIFICATIONS: (This note is used to comply with regulatory documentation for home oxygen)  Patient Saturations on Room Air at Rest = 100%  Patient Saturations on Room Air while Ambulating = 96%  Patient Saturations on  N/A Liters of oxygen while Ambulating = N/A%  Please briefly explain why patient needs home oxygen: based off of this walking trial patient does not require Home O2.   Barbee [place]. Brigitte Pulse, RN

## 2015-12-06 NOTE — Progress Notes (Signed)
Oxygen saturationed 91-92% on RA while sleeping. Will check ambulating sats when patient wakes up.  Chelsea Hensley. Brigitte Pulse, RN

## 2015-12-06 NOTE — Evaluation (Signed)
Physical Therapy Evaluation Patient Details Name: Chelsea Hensley MRN: [id number] DOB: 04-26-1946 Today's Date: 12/06/2015   History of Present Illness  70 y.o. female admitted with back pain, SOB, Dx of sickle cell crisis, s/p transfusion of 1 unit blood  Clinical Impression  Pt is independent with bed mobility, transfers, and ambulation. Her gait is slow but steady. She is mildly deconditioned from decreased activity during hospitalization and was encourage to ambulate several times a day at home. She declined use of RW and HHPT.     Follow Up Recommendations No PT follow up    Equipment Recommendations  None recommended by PT    Recommendations for Other Services       Precautions / Restrictions Precautions Precautions: Fall Precaution Comments: 1 fall in past year -fell on steps in Nov with L foot fx Restrictions Weight Bearing Restrictions: No      Mobility  Bed Mobility Overal bed mobility: Modified Independent                Transfers Overall transfer level: Independent                  Ambulation/Gait Ambulation/Gait assistance: Independent Ambulation Distance (Feet): 100 Feet Assistive device: None;Rolling walker (2 wheeled) Gait Pattern/deviations: Wide base of support   Gait velocity interpretation: Below normal speed for age/gender General Gait Details: 34' with RW, 39' without assistive device, pt prefers walking without device, she had no loss of balance, gait is slow but steady; encouraged pt to consider using cane at home for extra support, pt agreeable  Stairs            Wheelchair Mobility    Modified Rankin (Stroke Patients Only)       Balance                                             Pertinent Vitals/Pain Pain Assessment: 0-10 Pain Score: 5  Pain Location: back Pain Intervention(s): Heat applied    Home Living Family/patient expects to be discharged to:: Private residence Living Arrangements:  Children Available Help at Discharge: Family;Available 24 hours/day Type of Home: Apartment Home Access: Level entry     Home Layout: One level Home Equipment: Cane - single point;Grab bars - tub/shower      Prior Function Level of Independence: Independent               Hand Dominance        Extremity/Trunk Assessment   Upper Extremity Assessment: Overall WFL for tasks assessed           Lower Extremity Assessment: Overall WFL for tasks assessed (+4/5 grossly, pt states she feels weaker than baseline from being in bed for several days)      Cervical / Trunk Assessment: Normal  Communication   Communication: No difficulties  Cognition Arousal/Alertness: Awake/alert Behavior During Therapy: WFL for tasks assessed/performed Overall Cognitive Status: Within Functional Limits for tasks assessed                      General Comments      Exercises        Assessment/Plan    PT Assessment Patent does not need any further PT services  PT Diagnosis Generalized weakness   PT Problem List    PT Treatment Interventions     PT Goals (Current goals can  be found in the Care Plan section) Acute Rehab PT Goals PT Goal Formulation: All assessment and education complete, DC therapy    Frequency     Barriers to discharge        Co-evaluation               End of Session Equipment Utilized During Treatment: Gait belt Activity Tolerance: Patient tolerated treatment well Patient left: in chair;with call bell/phone within reach Nurse Communication: Mobility status         Time: 0962-8366 PT Time Calculation (min) (ACUTE ONLY): 18 min   Charges:   PT Evaluation $PT Eval Low Complexity: 1 Procedure     PT G CodesBlondell Reveal Hensley 12/06/2015, 2:02 PM [phone]

## 2015-12-07 LAB — CULTURE, BLOOD (ROUTINE X 2)
Culture: NO GROWTH
Culture: NO GROWTH

## 2015-12-07 LAB — HEMOGLOBINOPATHY EVALUATION
HGB A2 QUANT: 3.6 % — AB (ref 0.7–3.1)
HGB C: 0 %
HGB S QUANTITAION: 36.7 % — AB
Hgb A: 54.8 % — ABNORMAL LOW (ref 94.0–98.0)
Hgb F Quant: 4.9 % — ABNORMAL HIGH (ref 0.0–2.0)

## 2015-12-28 ENCOUNTER — Ambulatory Visit: Payer: MEDICARE | Admitting: Family Medicine

## 2016-01-08 ENCOUNTER — Ambulatory Visit: Payer: MEDICARE | Admitting: Family Medicine

## 2016-01-12 ENCOUNTER — Ambulatory Visit (INDEPENDENT_AMBULATORY_CARE_PROVIDER_SITE_OTHER): Payer: MEDICARE | Admitting: Family Medicine

## 2016-01-12 ENCOUNTER — Encounter (HOSPITAL_COMMUNITY): Payer: Self-pay | Admitting: Emergency Medicine

## 2016-01-12 ENCOUNTER — Emergency Department (HOSPITAL_COMMUNITY)
Admission: EM | Admit: 2016-01-12 | Discharge: 2016-01-12 | Disposition: A | Discharge status: Home / Self Care | Payer: MEDICARE | Attending: Emergency Medicine | Admitting: Emergency Medicine

## 2016-01-12 ENCOUNTER — Emergency Department (HOSPITAL_COMMUNITY): Payer: MEDICARE

## 2016-01-12 ENCOUNTER — Encounter: Payer: Self-pay | Admitting: Family Medicine

## 2016-01-12 VITALS — BP 159/58 | HR 104 | Temp 98.6°F | Resp 20 | Ht 63.0 in | Wt 143.0 lb

## 2016-01-12 DIAGNOSIS — Z13220 Encounter for screening for lipoid disorders: Secondary | ICD-10-CM

## 2016-01-12 DIAGNOSIS — Z13820 Encounter for screening for osteoporosis: Secondary | ICD-10-CM

## 2016-01-12 DIAGNOSIS — R0602 Shortness of breath: Secondary | ICD-10-CM | POA: Diagnosis not present

## 2016-01-12 DIAGNOSIS — Z1239 Encounter for other screening for malignant neoplasm of breast: Secondary | ICD-10-CM

## 2016-01-12 DIAGNOSIS — Z23 Encounter for immunization: Secondary | ICD-10-CM | POA: Diagnosis not present

## 2016-01-12 DIAGNOSIS — Z79899 Other long term (current) drug therapy: Secondary | ICD-10-CM | POA: Diagnosis not present

## 2016-01-12 DIAGNOSIS — D5700 Hb-SS disease with crisis, unspecified: Secondary | ICD-10-CM | POA: Insufficient documentation

## 2016-01-12 DIAGNOSIS — Z1159 Encounter for screening for other viral diseases: Secondary | ICD-10-CM | POA: Diagnosis not present

## 2016-01-12 DIAGNOSIS — D571 Sickle-cell disease without crisis: Secondary | ICD-10-CM | POA: Diagnosis not present

## 2016-01-12 DIAGNOSIS — Z87891 Personal history of nicotine dependence: Secondary | ICD-10-CM | POA: Diagnosis not present

## 2016-01-12 DIAGNOSIS — M549 Dorsalgia, unspecified: Secondary | ICD-10-CM | POA: Diagnosis present

## 2016-01-12 LAB — COMPLETE METABOLIC PANEL WITH GFR
ALK PHOS: 122 U/L (ref 33–130)
ALT: 24 U/L (ref 6–29)
AST: 47 U/L — AB (ref 10–35)
Albumin: 4 g/dL (ref 3.6–5.1)
BUN: 18 mg/dL (ref 7–25)
CALCIUM: 9.9 mg/dL (ref 8.6–10.4)
CHLORIDE: 111 mmol/L — AB (ref 98–110)
CO2: 17 mmol/L — ABNORMAL LOW (ref 20–31)
CREATININE: 1.16 mg/dL — AB (ref 0.60–0.93)
GFR, EST AFRICAN AMERICAN: 55 mL/min — AB (ref >=60)
GFR, Est Non African American: 48 mL/min — ABNORMAL LOW (ref >=60)
Glucose, Bld: 102 mg/dL — ABNORMAL HIGH (ref 65–99)
POTASSIUM: 4.5 mmol/L (ref 3.5–5.3)
Sodium: 141 mmol/L (ref 135–146)
Total Bilirubin: 4.8 mg/dL — ABNORMAL HIGH (ref 0.2–1.2)
Total Protein: 6 g/dL — ABNORMAL LOW (ref 6.1–8.1)

## 2016-01-12 LAB — CBC WITH DIFFERENTIAL/PLATELET
BASOS ABS: 0.1 10*3/uL (ref 0.0–0.1)
Basophils Relative: 1 %
EOS ABS: 0.3 10*3/uL (ref 0.0–0.7)
Eosinophils Relative: 4 %
HCT: 20.7 % — ABNORMAL LOW (ref 36.0–46.0)
HEMOGLOBIN: 7.3 g/dL — AB (ref 12.0–15.0)
LYMPHS PCT: 34 %
Lymphs Abs: 2.7 10*3/uL (ref 0.7–4.0)
MCH: 33.2 pg (ref 26.0–34.0)
MCHC: 35.3 g/dL (ref 30.0–36.0)
MCV: 94.1 fL (ref 78.0–100.0)
MONO ABS: 1 10*3/uL (ref 0.1–1.0)
MONOS PCT: 12 %
NEUTROS PCT: 49 %
Neutro Abs: 3.9 10*3/uL (ref 1.7–7.7)
PLATELETS: 252 10*3/uL (ref 150–400)
RBC: 2.2 MIL/uL — ABNORMAL LOW (ref 3.87–5.11)
RDW: 26.1 % — ABNORMAL HIGH (ref 11.5–15.5)
WBC: 8 10*3/uL (ref 4.0–10.5)
nRBC: 7 /100 WBC — ABNORMAL HIGH

## 2016-01-12 LAB — COMPREHENSIVE METABOLIC PANEL
ALBUMIN: 4.3 g/dL (ref 3.5–5.0)
ALK PHOS: 124 U/L (ref 38–126)
ALT: 28 U/L (ref 14–54)
AST: 49 U/L — AB (ref 15–41)
Anion gap: 6 (ref 5–15)
BUN: 19 mg/dL (ref 6–20)
CALCIUM: 9.9 mg/dL (ref 8.9–10.3)
CHLORIDE: 115 mmol/L — AB (ref 101–111)
CO2: 19 mmol/L — AB (ref 22–32)
CREATININE: 1.01 mg/dL — AB (ref 0.44–1.00)
GFR calc Af Amer: >60 mL/min (ref >60)
GFR calc non Af Amer: 55 mL/min — ABNORMAL LOW (ref >60)
GLUCOSE: 105 mg/dL — AB (ref 65–99)
Potassium: 4.2 mmol/L (ref 3.5–5.1)
SODIUM: 140 mmol/L (ref 135–145)
Total Bilirubin: 4.6 mg/dL — ABNORMAL HIGH (ref 0.3–1.2)
Total Protein: 6.8 g/dL (ref 6.5–8.1)

## 2016-01-12 LAB — RETICULOCYTES
RBC.: 2.2 MIL/uL — AB (ref 3.87–5.11)
Retic Ct Pct: >23 % — ABNORMAL HIGH (ref 0.4–3.1)

## 2016-01-12 LAB — I-STAT TROPONIN, ED: TROPONIN I, POC: 0 ng/mL (ref 0.00–0.08)

## 2016-01-12 LAB — LIPID PANEL
CHOLESTEROL: 150 mg/dL (ref 125–200)
HDL: 43 mg/dL — ABNORMAL LOW (ref >=46)
LDL Cholesterol: 82 mg/dL (ref <130)
TRIGLYCERIDES: 123 mg/dL (ref <150)
Total CHOL/HDL Ratio: 3.5 Ratio (ref <=5.0)
VLDL: 25 mg/dL (ref <30)

## 2016-01-12 LAB — BRAIN NATRIURETIC PEPTIDE: B Natriuretic Peptide: 253 pg/mL — ABNORMAL HIGH (ref 0.0–100.0)

## 2016-01-12 MED ORDER — HYDROMORPHONE HCL 1 MG/ML IJ SOLN
1.0000 mg | Freq: Once | INTRAMUSCULAR | Status: AC
  Administered 2016-01-12: 1 mg via INTRAVENOUS
  Filled 2016-01-12: qty 1

## 2016-01-12 NOTE — ED Triage Notes (Signed)
Hx of sickle cell. Went to sickle cell center for ongoing back pain from sickle cell. Staff saw pt had SpO2 of 91% so sent to ED for eval. Pt denies SOB. SpO2 between 93 and 95% in triage. Pain in back is typical for her crisis pain.

## 2016-01-12 NOTE — Progress Notes (Signed)
Chelsea Hensley, is a 70 y.o. female  MGN:[id number]  BVQ:[id number]  DOB - 1945-06-23  CC:  Chief Complaint  Patient presents with  . Establish Care    scd patinet.        HPI: Chelsea Hensley is a 70 y.o. female here to establish care. She has a history of sickle cell disease. With fairly frequent transfusions. She has recently moved here from [place] and we do not have her medical records from her doctor there.  She currently has a small supply of pain medications on hand. Today we are focusing on getting her into our system and have her follow-up with Dr. Doreene Burke once she have her records. She reports having some dyspnea. She reports this usually happens when she gets anemia. Her HBG was 8 after recent  Transfusion. Her pulse ox was 91 initially and a repeat was 88. Will send to ED.She request a handicap permit today due to dyspnea with walking and pain when sickle cell flares.  Health Maintenance:  See Plan. We will need her records from previous doctor before further health maintenance.   Allergies  Allergen Reactions  . Quinolones Anaphylaxis    All vitals and pressures dropped, blacked out and had seizures   Past Medical History:  Diagnosis Date  . Gout   . Hyperuricemia   . Sickle cell anemia (HCC)   . 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    Current Outpatient Prescriptions on File Prior to Visit  Medication Sig Dispense Refill  . allopurinol (ZYLOPRIM) 100 MG tablet Take 200 mg by mouth daily.    . Deferasirox (JADENU) 180 MG TABS Take 180 mg by mouth daily. Take along with two 360 mg tablets to equal a total dose of 900 mg.    . Deferasirox (JADENU) 360 MG TABS Take 720 mg by mouth daily. Take with 180 mg tablet to equal a total dose of 800 mg.    . folic acid (FOLVITE) 1 MG tablet Take 1 mg by mouth daily.    . furosemide (LASIX) 20 MG tablet Take 20 mg by  mouth daily as needed for fluid.    . indomethacin (INDOCIN) 25 MG capsule Take 25 mg by mouth 3 (three) times daily as needed (for gout flares).    . Multiple Vitamin (MULTIVITAMIN WITH MINERALS) TABS tablet Take 1 tablet by mouth daily.    Marland Kitchen oxyCODONE (OXYCONTIN) 80 mg 12 hr tablet Take 80 mg by mouth every 12 (twelve) hours as needed (for severe pain).    Marland Kitchen oxyCODONE-acetaminophen (PERCOCET/ROXICET) 5-325 MG tablet Take 1-2 tablets by mouth every 4 (four) hours as needed for moderate pain or severe pain.     History reviewed. No pertinent family history. Social History   Social History  . Marital status: Divorced    Spouse name: N/A  . Number of children: N/A  . Years of education: N/A   Occupational History  . Not on file.   Social History Main Topics  . Smoking status: Former Research scientist (life sciences)  . Smokeless tobacco: Never Used  . Alcohol use Yes     Comment: rare  . Drug use: No  . Sexual activity: Not on file   Other Topics Concern  . Not on file   Social History Narrative  . No narrative on file    Review of Systems: Constitutional: Negative for fever, chills, appetite change, weight loss. Positive  for fatigue Skin: Negative for rashes or lesions of concern. HENT: Negative for ear pain, ear discharge.nose bleeds Eyes: Negative for pain, discharge, redness, itching and visual disturbance. Neck: Negative for pain, stiffness Respiratory: Negative for cough, wheezing. Positive for shortness of breath Cardiovascular: She reports chest pain related to sickle cell disease and reports leg swelling at times. Gastrointestinal: Negative for abdominal pain, nausea, vomiting, diarrhea, constipations Genitourinary: Negative for dysuria, urgency, frequency, hematuria,  Musculoskeletal: Positive for intermittent back, leg and arm pain related to sickle cell disease. Neurological: Negative for dizziness, tremors, seizures, syncope,   light-headedness, numbness and headaches.  Hematological:  Negative for easy bruising or bleeding Psychiatric/Behavioral: Negative for depression, anxiety, decreased concentration, confusion   Objective:   Vitals:   01/12/16 0902  BP: (!) 159/58  Pulse: (!) 104  Resp: 20  Temp: 98.6 F (37 C)    Physical Exam: Constitutional: Patient appears well-developed and well-nourished. No distress. HENT: Normocephalic, atraumatic, External right and left ear normal. Oropharynx is clear and moist.  Eyes: Conjunctivae and EOM are normal. PERRLA, no scleral icterus. Neck: Normal ROM. Neck supple. No lymphadenopathy, No thyromegaly. CVS: RRR, S1/S2 +, no murmurs, no gallops, no rubs Pulmonary: breath sounds normal, no stridor, rhonchi, wheezes, rales. Effort slightly increased. Abdominal: Soft. Normoactive BS,, no distension, tenderness, rebound or guarding.  Musculoskeletal: Normal range of motion. No edema and no tenderness.  Neuro: Alert.Normal muscle tone coordination. Non-focal Skin: Skin is warm and dry. No rash noted. Not diaphoretic. No erythema. No pallor. Psychiatric: Normal mood and affect. Behavior, judgment, thought content normal.  Lab Results  Component Value Date   WBC 9.0 12/06/2015   HGB 8.0 (L) 12/06/2015   HCT 22.6 (L) 12/06/2015   MCV 91.9 12/06/2015   PLT 186 12/06/2015   Lab Results  Component Value Date   CREATININE 1.07 (H) 12/06/2015   BUN 33 (H) 12/06/2015   NA 141 12/06/2015   K 4.6 12/06/2015   CL 117 (H) 12/06/2015   CO2 21 (L) 12/06/2015    No results found for: HGBA1C Lipid Panel  No results found for: CHOL, TRIG, HDL, CHOLHDL, VLDL, LDLCALC     Assessment and plan:   1. Hb-SS disease without crisis ([place])  - Microalbumin, urine - COMPLETE METABOLIC PANEL WITH GFR - CBC with Differential  2. Need for prophylactic vaccination and inoculation against influenza  - Flu Vaccine QUAD 36+ mos PF IM (Fluarix & Fluzone Quad PF)  3. Screening cholesterol level  - Lipid panel  4. Need for hepatitis C  screening test  - Hepatitis C Antibody  5. Screening for breast cancer  - MM DIGITAL SCREENING BILATERAL; Future  6. Screening for osteoporosis  - DG Bone Density; Future   No Follow-up on file.  The patient was given clear instructions to go to ER or return to medical center if symptoms don't improve, worsen or new problems develop. The patient verbalized understanding.    Micheline Chapman FNP  01/12/2016, 12:24 PM

## 2016-01-12 NOTE — ED Provider Notes (Signed)
[facility] DEPT Provider Note   CSN: [id number] Arrival date & time: 01/12/16  1026     History   Chief Complaint Chief Complaint  Patient presents with  . Back Pain  . Sickle Cell Pain Crisis  . Shortness of Breath    HPI Chelsea Hensley is a 70 y.o. female.  HPI   70 year old female with history of hemoglobin SS with hereditary persistent fetal hemoglobin diagnosis 10-month-old who presented to the ED at the request of the sickle cell nurse for evaluation of her low O2 level on today's visit.  Patient was hospitalized for sickle cell related pain last month. At that time she was complaining of shortness of breath and her labs were significant for hemoglobin of 6.3 as well as O2 in the low 90s. She received blood transfusion during her visit and her symptoms improved. She was set up to follow-up at the sickle cell clinic for further management of her condition.  Today she went to the clinic for her first outpt visit.  She report that she felt fine.  She has mild back pain and chest pain which is usual for her. She does endorse mild shortness of breath but much improved from prior. Her pain is otherwise control using her medication. She denies having fever, productive cough, hemoptysis. She recently moved from [place] to her. States that her PCP was able to control symptoms adequately back in [place]. She normally received blood transfusion every 2-3 months. She does not think she need additional blood transfusion at this time. No other complaint. She is her only other urging of the sickle cell clinic.   Past Medical History:  Diagnosis Date  . Gout   . Hyperuricemia   . Sickle cell anemia (HCC)   . 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 Anemia 12/02/2015  . Sickle cell anemia ([place]) 12/02/2015    Past Surgical History:  Procedure Laterality Date  . ABDOMINAL HYSTERECTOMY    .  CATARACT EXTRACTION, BILATERAL    . CHOLECYSTECTOMY    . HERNIA REPAIR    . TOTAL ABDOMINAL HYSTERECTOMY W/ BILATERAL SALPINGOOPHORECTOMY Bilateral     OB History    No data available       Home Medications    Prior to Admission medications   Medication Sig Start Date End Date Taking? Authorizing Provider  allopurinol (ZYLOPRIM) 100 MG tablet Take 200 mg by mouth daily.    Historical Provider, MD  Deferasirox (JADENU) 180 MG TABS Take 180 mg by mouth daily. Take along with two 360 mg tablets to equal a total dose of 900 mg.    Historical Provider, MD  Deferasirox (JADENU) 360 MG TABS Take 720 mg by mouth daily. Take with 180 mg tablet to equal a total dose of 900 mg.    Historical Provider, MD  folic acid (FOLVITE) 1 MG tablet Take 1 mg by mouth daily.    Historical Provider, MD  furosemide (LASIX) 20 MG tablet Take 20 mg by mouth daily as needed for fluid.    Historical Provider, MD  indomethacin (INDOCIN) 25 MG capsule Take 25 mg by mouth 3 (three) times daily as needed (for gout flares).    Historical Provider, MD  Multiple Vitamin (MULTIVITAMIN WITH MINERALS) TABS tablet Take 1 tablet by mouth daily.    Historical Provider, MD  oxyCODONE (OXYCONTIN) 80 mg 12 hr tablet Take 80 mg by  mouth every 12 (twelve) hours as needed (for severe pain).    Historical Provider, MD  oxyCODONE-acetaminophen (PERCOCET/ROXICET) 5-325 MG tablet Take 1-2 tablets by mouth every 4 (four) hours as needed for moderate pain or severe pain.    Historical Provider, MD    Family History History reviewed. No pertinent family history.  Social History Social History  Substance Use Topics  . Smoking status: Former Research scientist (life sciences)  . Smokeless tobacco: Never Used  . Alcohol use Yes     Comment: rare     Allergies   Quinolones   Review of Systems Review of Systems  All other systems reviewed and are negative.    Physical Exam Updated Vital Signs BP 148/65   Pulse 88   Temp 98.7 F (37.1 C) (Oral)    Resp 17   SpO2 95%   Physical Exam  Constitutional: She appears well-developed and well-nourished. No distress.  HENT:  Head: Atraumatic.  Eyes: Conjunctivae are normal.  Neck: Neck supple.  Cardiovascular: Normal rate and regular rhythm.   Pulmonary/Chest: Effort normal and breath sounds normal.  Abdominal: Soft. There is no tenderness.  Musculoskeletal: She exhibits no tenderness.  Neurological: She is alert.  Skin: No rash noted.  Psychiatric: She has a normal mood and affect.  Nursing note and vitals reviewed.    ED Treatments / Results  Labs (all labs ordered are listed, but only abnormal results are displayed) Labs Reviewed  CBC WITH DIFFERENTIAL/PLATELET - Abnormal; Notable for the following:       Result Value   RBC 2.20 (*)    Hemoglobin 7.3 (*)    HCT 20.7 (*)    RDW 26.1 (*)    nRBC 7 (*)    All other components within normal limits  COMPREHENSIVE METABOLIC PANEL - Abnormal; Notable for the following:    Chloride 115 (*)    CO2 19 (*)    Glucose, Bld 105 (*)    Creatinine, Ser 1.01 (*)    AST 49 (*)    Total Bilirubin 4.6 (*)    GFR calc non Af Amer 55 (*)    All other components within normal limits  RETICULOCYTES - Abnormal; Notable for the following:    Retic Ct Pct >23.0 (*)    RBC. 2.20 (*)    All other components within normal limits  BRAIN NATRIURETIC PEPTIDE - Abnormal; Notable for the following:    B Natriuretic Peptide 253.0 (*)    All other components within normal limits  I-STAT TROPOININ, ED    EKG  EKG Interpretation  Date/Time:  Friday January 12 2016 10:40:33 EDT Ventricular Rate:  82 PR Interval:    QRS Duration: 88 QT Interval:  383 QTC Calculation: 448 R Axis:   61 Text Interpretation:  Borderline T abnormalities, anterior leads Baseline wander in lead(s) V6 Normal sinus rhythm wandering baseline similar to prior EKG  Confirmed by LIU MD, DANA [phone]) on 01/12/2016 10:52:34 AM       Radiology Dg Chest 2 View  Result  Date: 01/12/2016 CLINICAL DATA:  Hypoxia, history of sickle cell disease and tobacco use EXAM: CHEST  2 VIEW COMPARISON:  12/02/2015 FINDINGS: Cardiac shadow is mildly enlarged but stable. Diffuse chronic interstitial Changes are again seen bilaterally and stable from the prior exam. No focal infiltrate or sizable effusion is seen. IMPRESSION: Chronic interstitial changes without acute abnormality. Electronically Signed   By: Inez Catalina M.D.   On: 01/12/2016 12:32    Procedures Procedures (including critical care  time)  Medications Ordered in ED Medications  HYDROmorphone (DILAUDID) injection 1 mg (1 mg Intravenous Given 01/12/16 1323)     Initial Impression / Assessment and Plan / ED Course  I have reviewed the triage vital signs and the nursing notes.  Pertinent labs & imaging results that were available during my care of the patient were reviewed by me and considered in my medical decision making (see chart for details).  Clinical Course    BP 137/71   Pulse 83   Temp 98.7 F (37.1 C) (Oral)   Resp 16   SpO2 96% Comment: During ambulation   Final Clinical Impressions(s) / ED Diagnoses   Final diagnoses:  Sickle cell anemia with pain (HCC)    New Prescriptions New Prescriptions   No medications on file   11:13 AM Patient was recommended to come to ER from sickle cell clinic earlier today when she was therefore a regular follow-up visit. She reportedly had a documented O2 of 88% some room air. She is currently in no acute respiratory discomfort and her oxygen is between 93-96% on room air. No symptoms to suggest pneumonia or infectious etiology. I have low suspicion for PE. I do not think patient need additional blood work or blood transfusion at this time as patient is well aware of her symptoms. Currently patient is stable for discharge. Care discussed with Dr. Oleta Mouse  1:41 PM Elevated BNP of 253 however chest x-ray shows no evidence of pulmonary edema or pleural effusion  to suggest CHF. Her other labs are at baseline. Her hemoglobin is 7.3. Patient resting comfortably and while ambulating her sats remains at 95-96% on room air. At this time patient is stable for discharge, she will follow-up with sickle cell clinic for further management of her condition.   Domenic Moras, PA-C 01/12/16 Rio Liu, MD 01/12/16 2154

## 2016-01-12 NOTE — Discharge Instructions (Signed)
Please follow up at the sickle cell clinic for further management of your sickle cell disease.

## 2016-01-12 NOTE — Progress Notes (Signed)
Medicaid pt recently established care with [facility] S[facility] on 01/12/16 pt with [facility] admission x 1 and ED visit x 1 in last 6 months

## 2016-01-12 NOTE — ED Notes (Signed)
Unable to collect labs at this time per nurse patient needs a ultrasound IV

## 2016-01-12 NOTE — ED Notes (Signed)
Patient transported to X-ray 

## 2016-01-13 LAB — CBC WITH DIFFERENTIAL/PLATELET
Basophils Absolute: 69 cells/uL (ref 0–200)
Basophils Relative: 1 %
EOS PCT: 3 %
Eosinophils Absolute: 207 cells/uL (ref 15–500)
HCT: 21.3 % — ABNORMAL LOW (ref 35.0–45.0)
Hemoglobin: 7.2 g/dL — ABNORMAL LOW (ref 11.7–15.5)
LYMPHS PCT: 36 %
Lymphs Abs: 2484 cells/uL (ref 850–3900)
MCH: 32 pg (ref 27.0–33.0)
MCHC: 33.8 g/dL (ref 32.0–36.0)
MCV: 94.7 fL (ref 80.0–100.0)
MONOS PCT: 11 %
MPV: 9.4 fL (ref 7.5–12.5)
Monocytes Absolute: 759 cells/uL (ref 200–950)
NEUTROS PCT: 49 %
Neutro Abs: 3381 cells/uL (ref 1500–7800)
Platelets: 283 10*3/uL (ref 140–400)
RBC: 2.25 MIL/uL — AB (ref 3.80–5.10)
RDW: 24.2 % — AB (ref 11.0–15.0)
WBC: 6.9 10*3/uL (ref 3.8–10.8)

## 2016-01-13 LAB — MICROALBUMIN, URINE: Microalb, Ur: 176.1 mg/dL

## 2016-01-13 LAB — HEPATITIS C ANTIBODY: HCV Ab: REACTIVE — AB

## 2016-01-16 LAB — HEPATITIS C RNA QUANTITATIVE: HCV Quantitative: NOT DETECTED IU/mL (ref <15)

## 2016-01-24 ENCOUNTER — Other Ambulatory Visit: Payer: Self-pay | Admitting: Family Medicine

## 2016-01-24 MED ORDER — OXYCODONE HCL ER 80 MG PO T12A: 80.0000 mg | EXTENDED_RELEASE_TABLET | Freq: Two times a day (BID) | ORAL | 0 refills | Status: AC | PRN

## 2016-01-24 MED ORDER — FOLIC ACID 1 MG PO TABS: 1.0000 mg | ORAL_TABLET | Freq: Every day | ORAL | 1 refills | Status: AC

## 2016-01-24 MED ORDER — OXYCODONE-ACETAMINOPHEN 5-325 MG PO TABS: 1.0000 | ORAL_TABLET | ORAL | 0 refills | Status: DC | PRN

## 2016-01-24 MED ORDER — ALLOPURINOL 100 MG PO TABS: 200.0000 mg | ORAL_TABLET | Freq: Every day | ORAL | 3 refills | Status: DC

## 2016-02-07 ENCOUNTER — Telehealth (HOSPITAL_COMMUNITY): Payer: Self-pay | Admitting: Hematology

## 2016-02-07 ENCOUNTER — Ambulatory Visit (HOSPITAL_COMMUNITY)
Admission: RE | Admit: 2016-02-07 | Discharge: 2016-02-07 | Disposition: A | Discharge status: Home / Self Care | Payer: MEDICARE | Source: Ambulatory Visit | Attending: Internal Medicine | Admitting: Internal Medicine

## 2016-02-07 DIAGNOSIS — D571 Sickle-cell disease without crisis: Secondary | ICD-10-CM | POA: Insufficient documentation

## 2016-02-07 NOTE — Telephone Encounter (Signed)
Left message on voicemail asking patient to call back in regards to blood transfusion.  I spoke with Dr. Maia Petties and he advised patient has antibodies and asked if she could be type/screened today.  I asked patient to call back so that we could set a time for her to come into the office today to give a blood sample.  Will wait on patient to call back.

## 2016-02-07 NOTE — Procedures (Signed)
[facility]  Procedure Note  Fani Rotondo IVH:[id number] DOB: 03-Apr-1946 DOA: 02/07/2016   Dr. Maia Petties   Associated Diagnosis: Sickle cell anemia, hemoglobin 6.6  Procedure Note: patient came today to be type and crossed for blood transfusion tomorrow   Condition During Procedure: stable   Condition at Rich, Crisol Muecke, [facility]

## 2016-02-07 NOTE — Telephone Encounter (Signed)
Patient returned my call and stated she could come today for type and screen.  Patient states she is very familiar with the blood process and is aware that she has antibodies and typically has to wait a day or so for blood.  Patient will come in this morning for type and screen

## 2016-02-08 ENCOUNTER — Ambulatory Visit (HOSPITAL_COMMUNITY)
Admission: RE | Admit: 2016-02-08 | Discharge: 2016-02-08 | Disposition: A | Discharge status: Home / Self Care | Payer: MEDICARE | Source: Ambulatory Visit | Attending: Internal Medicine | Admitting: Internal Medicine

## 2016-02-08 DIAGNOSIS — D571 Sickle-cell disease without crisis: Secondary | ICD-10-CM | POA: Diagnosis not present

## 2016-02-08 LAB — PREPARE RBC (CROSSMATCH)

## 2016-02-08 MED ORDER — SODIUM CHLORIDE 0.9 % IV SOLN
Freq: Once | INTRAVENOUS | Status: AC
Start: 2016-02-08 — End: 2016-02-08
  Administered 2016-02-08: 09:00:00 via INTRAVENOUS

## 2016-02-08 MED ORDER — FUROSEMIDE 10 MG/ML IJ SOLN
40.0000 mg | Freq: Once | INTRAMUSCULAR | Status: AC
  Administered 2016-02-08: 40 mg via INTRAVENOUS
  Filled 2016-02-08: qty 4

## 2016-02-08 MED ORDER — ACETAMINOPHEN 325 MG PO TABS
650.0000 mg | ORAL_TABLET | Freq: Once | ORAL | Status: AC
  Administered 2016-02-08: 650 mg via ORAL
  Filled 2016-02-08: qty 2

## 2016-02-08 MED ORDER — DIPHENHYDRAMINE HCL 25 MG PO CAPS
50.0000 mg | ORAL_CAPSULE | Freq: Once | ORAL | Status: AC
  Administered 2016-02-08: 25 mg via ORAL
  Filled 2016-02-08: qty 2

## 2016-02-08 NOTE — Procedures (Signed)
[facility]  Procedure Note  Chelsea Hensley [id number]:[id number] DOB: [birth year] DOA: 02/08/2016   PCP: Latanya Presser, MD   Associated Diagnosis: Sickle Cell Anemia  Procedure Note: Transfusion of 2 units PRBCs   Condition During Procedure:Pt tolerated well    Condition at Discharge: Pt alert, oriented; no complications noted   Nigel Sloop, [facility]

## 2016-02-08 NOTE — Progress Notes (Signed)
Patient only wanted to take 25 mg of Benadryl.

## 2016-02-08 NOTE — Discharge Instructions (Signed)
Blood Transfusion, Care After °Refer to this sheet in the next few weeks. These instructions provide you with information about caring for yourself after your procedure. Your health care provider may also give you more specific instructions. Your treatment has been planned according to current medical practices, but problems sometimes occur. Call your health care provider if you have any problems or questions after your procedure. °WHAT TO EXPECT AFTER THE PROCEDURE °After your procedure, it is common to have: °· Bruising and soreness at the IV site. °· Chills or fever. °· Headache. °HOME CARE INSTRUCTIONS °· Take medicines only as directed by your health care provider. Ask your health care provider if you can take an over-the-counter pain reliever in case you have a fever or headache a day or two after your transfusion. °· Return to your normal activities as directed by your health care provider. °SEEK MEDICAL CARE IF:  °· You develop redness or irritation at your IV site. °· You have persistent fever, chills, or headache. °· Your urine is darker than normal. °· Your urine turns pink, red, or brown.   °· The white part of your eye turns yellow (jaundice).   °· You feel weak after doing your normal activities.   °SEEK IMMEDIATE MEDICAL CARE IF:  °· You have trouble breathing. °· You have fever and chills along with: °¨ Anxiety. °¨ Chest or back pain. °¨ Flushed skin. °¨ Clammy skin. °¨ A rapid heartbeat. °¨ Nausea. °  °This information is not intended to replace advice given to you by your health care provider. Make sure you discuss any questions you have with your health care provider. °  °Document Released: 05/27/2014 Document Reviewed: 05/27/2014 °Elsevier Interactive Patient Education ©2016 Elsevier Inc. ° °

## 2016-02-09 LAB — TYPE AND SCREEN
ABO/RH(D): B POS
ANTIBODY SCREEN: NEGATIVE
Unit division: 0
Unit division: 0

## 2016-02-15 ENCOUNTER — Ambulatory Visit
Admission: RE | Admit: 2016-02-15 | Discharge: 2016-02-15 | Disposition: A | Discharge status: Home / Self Care | Payer: MEDICARE | Source: Ambulatory Visit | Attending: Internal Medicine | Admitting: Internal Medicine

## 2016-02-15 ENCOUNTER — Other Ambulatory Visit: Payer: Self-pay | Admitting: Internal Medicine

## 2016-02-15 DIAGNOSIS — R0602 Shortness of breath: Secondary | ICD-10-CM

## 2016-02-23 ENCOUNTER — Ambulatory Visit (HOSPITAL_COMMUNITY)
Admission: RE | Admit: 2016-02-23 | Discharge: 2016-02-23 | Disposition: A | Discharge status: Home / Self Care | Payer: MEDICARE | Source: Ambulatory Visit | Attending: Internal Medicine | Admitting: Internal Medicine

## 2016-02-23 DIAGNOSIS — D5780 Other sickle-cell disorders without crisis: Secondary | ICD-10-CM | POA: Diagnosis not present

## 2016-02-23 NOTE — Procedures (Signed)
[facility]  Procedure Note  Chelsea Hensley WYB:[id number] DOB: March 18, 1946 DOA: 02/23/2016   PCP: Latanya Presser, MD   Associated Diagnosis: Sickle Cell Anemia  Procedure Note:  Patient came in to have type/screen drawn for blood transfusion on Monday.  Labs drawn with out difficulty   Condition During Procedure: patient stable   Condition at Discharge: patient stable.  Patient instructed to keep blue blood band in place until Monday when she receives blood.  Patient verbalizes understanding.   Roberto Scales, RN  [facility]

## 2016-02-26 ENCOUNTER — Ambulatory Visit (HOSPITAL_COMMUNITY)
Admission: RE | Admit: 2016-02-26 | Discharge: 2016-02-26 | Disposition: A | Discharge status: Home / Self Care | Payer: MEDICARE | Source: Ambulatory Visit | Attending: Internal Medicine | Admitting: Internal Medicine

## 2016-02-26 DIAGNOSIS — D5780 Other sickle-cell disorders without crisis: Secondary | ICD-10-CM | POA: Diagnosis not present

## 2016-02-26 LAB — PREPARE RBC (CROSSMATCH)

## 2016-02-26 MED ORDER — ACETAMINOPHEN 325 MG PO TABS
650.0000 mg | ORAL_TABLET | Freq: Once | ORAL | Status: AC
  Administered 2016-02-26: 650 mg via ORAL
  Filled 2016-02-26: qty 2

## 2016-02-26 MED ORDER — SODIUM CHLORIDE 0.9 % IV SOLN
Freq: Once | INTRAVENOUS | Status: AC
  Administered 2016-02-26: 09:00:00 via INTRAVENOUS

## 2016-02-26 MED ORDER — DIPHENHYDRAMINE HCL 25 MG PO CAPS
50.0000 mg | ORAL_CAPSULE | Freq: Once | ORAL | Status: AC
  Administered 2016-02-26: 50 mg via ORAL
  Filled 2016-02-26: qty 2

## 2016-02-26 MED ORDER — FUROSEMIDE 10 MG/ML IJ SOLN
40.0000 mg | Freq: Once | INTRAMUSCULAR | Status: AC
  Administered 2016-02-26: 40 mg via INTRAVENOUS
  Filled 2016-02-26: qty 4

## 2016-02-26 NOTE — Progress Notes (Addendum)
Provider: Latanya Presser MD Associated Diagnosis: Sickle Cell Anemia systematic  Procedure: 2 units transfusion of PRBC's    Patient tolerated well. Patient being discharged per MD orders. Pt states and understanding of discharge instructions with no questions. Patient alert and ambulatory at time of discharge.

## 2016-02-26 NOTE — Discharge Instructions (Signed)
Blood Transfusion, Care After  These instructions give you information about caring for yourself after your procedure. Your doctor may also give you more specific instructions. Call your doctor if you have any problems or questions after your procedure.   HOME CARE   Take medicines only as told by your doctor. Ask your doctor if you can take an over-the-counter pain reliever if you have a fever or headache a day or two after your procedure.   Return to your normal activities as told by your doctor.  GET HELP IF:    You develop redness or irritation at your IV site.   You have a fever, chills, or a headache that does not go away.   Your pee (urine) is darker than normal.   Your urine turns:    Pink.    Red.    Brown.   The white part of your eye turns yellow (jaundice).   You feel weak after doing your normal activities.  GET HELP RIGHT AWAY IF:    You have trouble breathing.   You have fever and chills and you also have:    Anxiety.    Chest or back pain.    Flushed or pink skin.    Clammy or sweaty skin.    A fast heartbeat.    A sick feeling in your stomach (nausea).     This information is not intended to replace advice given to you by your health care provider. Make sure you discuss any questions you have with your health care provider.     Document Released: 05/27/2014 Document Reviewed: 05/27/2014  Elsevier Interactive Patient Education 2016 Elsevier Inc.

## 2016-02-27 ENCOUNTER — Ambulatory Visit: Payer: MEDICARE | Admitting: Internal Medicine

## 2016-02-27 LAB — TYPE AND SCREEN
ABO/RH(D): B POS
Antibody Screen: NEGATIVE
UNIT DIVISION: 0
Unit division: 0

## 2016-03-26 ENCOUNTER — Ambulatory Visit (HOSPITAL_COMMUNITY)
Admission: RE | Admit: 2016-03-26 | Discharge: 2016-03-26 | Disposition: A | Discharge status: Home / Self Care | Payer: MEDICARE | Source: Ambulatory Visit | Attending: Internal Medicine | Admitting: Internal Medicine

## 2016-03-26 DIAGNOSIS — D571 Sickle-cell disease without crisis: Secondary | ICD-10-CM | POA: Diagnosis not present

## 2016-03-26 DIAGNOSIS — D649 Anemia, unspecified: Secondary | ICD-10-CM | POA: Insufficient documentation

## 2016-03-26 NOTE — Progress Notes (Signed)
Patient came in for blood draw for type and screen for 2 units PRBC and will come back tomorrow for the transfusion.

## 2016-03-27 ENCOUNTER — Ambulatory Visit (HOSPITAL_COMMUNITY)
Admission: RE | Admit: 2016-03-27 | Discharge: 2016-03-27 | Disposition: A | Discharge status: Home / Self Care | Payer: MEDICARE | Source: Ambulatory Visit | Attending: Internal Medicine | Admitting: Internal Medicine

## 2016-03-27 DIAGNOSIS — D571 Sickle-cell disease without crisis: Secondary | ICD-10-CM | POA: Diagnosis not present

## 2016-03-27 LAB — PREPARE RBC (CROSSMATCH)

## 2016-03-27 MED ORDER — DIPHENHYDRAMINE HCL 25 MG PO CAPS
50.0000 mg | ORAL_CAPSULE | Freq: Once | ORAL | Status: AC
  Administered 2016-03-27: 50 mg via ORAL
  Filled 2016-03-27: qty 2

## 2016-03-27 MED ORDER — SODIUM CHLORIDE 0.9 % IV SOLN
Freq: Once | INTRAVENOUS | Status: AC
  Administered 2016-03-27: 10:00:00 via INTRAVENOUS

## 2016-03-27 MED ORDER — ACETAMINOPHEN 325 MG PO TABS
650.0000 mg | ORAL_TABLET | Freq: Once | ORAL | Status: AC
  Administered 2016-03-27: 650 mg via ORAL
  Filled 2016-03-27: qty 2

## 2016-03-27 MED ORDER — FUROSEMIDE 10 MG/ML IJ SOLN
40.0000 mg | Freq: Once | INTRAMUSCULAR | Status: AC
  Administered 2016-03-27: 40 mg via INTRAVENOUS
  Filled 2016-03-27 (×2): qty 4

## 2016-03-27 NOTE — Progress Notes (Signed)
PCP: Latanya Presser, MD   Associated Diagnosis: Sickle Cell Anemia  Procedure Note: Transfusion of 2 units PRBCs   Condition During Procedure:Pt tolerated well           Condition at Discharge: Pt alert, oriented; no complications noted

## 2016-03-28 LAB — TYPE AND SCREEN
ABO/RH(D): B POS
Antibody Screen: NEGATIVE
UNIT DIVISION: 0
UNIT DIVISION: 0

## 2016-04-25 ENCOUNTER — Other Ambulatory Visit: Payer: Self-pay | Admitting: Internal Medicine

## 2016-04-25 DIAGNOSIS — M818 Other osteoporosis without current pathological fracture: Secondary | ICD-10-CM

## 2016-04-30 ENCOUNTER — Other Ambulatory Visit: Payer: Self-pay | Admitting: Internal Medicine

## 2016-04-30 DIAGNOSIS — Z1211 Encounter for screening for malignant neoplasm of colon: Secondary | ICD-10-CM

## 2016-05-10 ENCOUNTER — Ambulatory Visit
Admission: RE | Admit: 2016-05-10 | Discharge: 2016-05-10 | Disposition: A | Discharge status: Home / Self Care | Payer: MEDICARE | Source: Ambulatory Visit | Attending: Internal Medicine | Admitting: Internal Medicine

## 2016-05-10 ENCOUNTER — Ambulatory Visit
Admission: RE | Admit: 2016-05-10 | Discharge: 2016-05-10 | Disposition: A | Discharge status: Home / Self Care | Payer: MEDICARE | Source: Ambulatory Visit | Attending: Family Medicine | Admitting: Family Medicine

## 2016-05-10 DIAGNOSIS — M818 Other osteoporosis without current pathological fracture: Secondary | ICD-10-CM

## 2016-05-10 DIAGNOSIS — Z1239 Encounter for other screening for malignant neoplasm of breast: Secondary | ICD-10-CM

## 2016-05-22 ENCOUNTER — Ambulatory Visit (HOSPITAL_COMMUNITY)
Admission: RE | Admit: 2016-05-22 | Discharge: 2016-05-22 | Disposition: A | Discharge status: Home / Self Care | Payer: MEDICARE | Source: Ambulatory Visit | Attending: Internal Medicine | Admitting: Internal Medicine

## 2016-05-22 DIAGNOSIS — D571 Sickle-cell disease without crisis: Secondary | ICD-10-CM | POA: Diagnosis present

## 2016-05-22 NOTE — Procedures (Signed)
[facility]  Procedure Note  Bristol Osentoski CVU:[id number] DOB: 12-05-1945 DOA: 05/22/2016   PCP: Audley Hose, MD   Associated Diagnosis: Sickle Cell Anemia symptomatic  Procedure Note:  Patient in today for type and screen.  Blood band placed, and specimen sent to lab.  Patient will return 05/23/2016 for blood transfusion   Condition During Procedure: stable   Condition at Discharge: stable   Roberto Scales, [facility]

## 2016-05-22 NOTE — Discharge Instructions (Signed)
Type and screen collected today.  Keep blue blood band in place until blood transfusion is completed.  Patient will return 05/23/2016 for transfusion of 2 units of Packed Red Blood Cells

## 2016-05-23 ENCOUNTER — Ambulatory Visit (HOSPITAL_COMMUNITY)
Admission: RE | Admit: 2016-05-23 | Discharge: 2016-05-23 | Disposition: A | Discharge status: Home / Self Care | Payer: MEDICARE | Source: Ambulatory Visit | Attending: Internal Medicine | Admitting: Internal Medicine

## 2016-05-23 DIAGNOSIS — D571 Sickle-cell disease without crisis: Secondary | ICD-10-CM | POA: Diagnosis not present

## 2016-05-23 MED ORDER — DIPHENHYDRAMINE HCL 25 MG PO CAPS
50.0000 mg | ORAL_CAPSULE | Freq: Once | ORAL | Status: AC
  Administered 2016-05-23: 50 mg via ORAL
  Filled 2016-05-23: qty 2

## 2016-05-23 MED ORDER — ACETAMINOPHEN 325 MG PO TABS
650.0000 mg | ORAL_TABLET | Freq: Once | ORAL | Status: AC
  Administered 2016-05-23: 650 mg via ORAL
  Filled 2016-05-23: qty 2

## 2016-05-23 MED ORDER — FUROSEMIDE 10 MG/ML IJ SOLN
40.0000 mg | Freq: Once | INTRAMUSCULAR | Status: AC
  Administered 2016-05-23: 40 mg via INTRAVENOUS
  Filled 2016-05-23: qty 4

## 2016-05-23 MED ORDER — SODIUM CHLORIDE 0.9 % IV SOLN
Freq: Once | INTRAVENOUS | Status: AC
  Administered 2016-05-23: 12:00:00 via INTRAVENOUS

## 2016-05-23 NOTE — Progress Notes (Signed)
Diagnosis: Sickle Cell Anemia  Provider: Leretha Pol, MD   Procedure: Patient received 2 units of PRBCs via piv.  Patient tolerated well.  Post procedure: Patient alert, oriented and ambulatory at discharge.

## 2016-05-23 NOTE — Discharge Instructions (Signed)

## 2016-05-24 ENCOUNTER — Ambulatory Visit: Payer: MEDICARE

## 2016-05-24 LAB — TYPE AND SCREEN
ABO/RH(D): B POS
Antibody Screen: NEGATIVE
Unit division: 0
Unit division: 0

## 2016-06-06 ENCOUNTER — Encounter (HOSPITAL_COMMUNITY): Payer: MEDICARE

## 2016-06-07 ENCOUNTER — Encounter (HOSPITAL_COMMUNITY): Payer: MEDICARE

## 2016-06-10 ENCOUNTER — Ambulatory Visit (HOSPITAL_COMMUNITY)
Admission: RE | Admit: 2016-06-10 | Discharge: 2016-06-10 | Disposition: A | Discharge status: Home / Self Care | Payer: MEDICARE | Source: Ambulatory Visit | Attending: Internal Medicine | Admitting: Internal Medicine

## 2016-06-10 DIAGNOSIS — D571 Sickle-cell disease without crisis: Secondary | ICD-10-CM | POA: Diagnosis not present

## 2016-06-11 ENCOUNTER — Ambulatory Visit (HOSPITAL_COMMUNITY)
Admission: RE | Admit: 2016-06-11 | Discharge: 2016-06-11 | Disposition: A | Discharge status: Home / Self Care | Payer: MEDICARE | Source: Ambulatory Visit | Attending: Internal Medicine | Admitting: Internal Medicine

## 2016-06-11 DIAGNOSIS — D571 Sickle-cell disease without crisis: Secondary | ICD-10-CM | POA: Diagnosis not present

## 2016-06-11 LAB — PREPARE RBC (CROSSMATCH)

## 2016-06-11 MED ORDER — SODIUM CHLORIDE 0.9 % IV SOLN
Freq: Once | INTRAVENOUS | Status: AC
  Administered 2016-06-11: 09:00:00 via INTRAVENOUS

## 2016-06-11 MED ORDER — DIPHENHYDRAMINE HCL 25 MG PO CAPS
50.0000 mg | ORAL_CAPSULE | Freq: Once | ORAL | Status: AC
  Administered 2016-06-11: 50 mg via ORAL
  Filled 2016-06-11: qty 2

## 2016-06-11 MED ORDER — ACETAMINOPHEN 325 MG PO TABS
650.0000 mg | ORAL_TABLET | Freq: Once | ORAL | Status: AC
  Administered 2016-06-11: 650 mg via ORAL
  Filled 2016-06-11: qty 2

## 2016-06-11 MED ORDER — FUROSEMIDE 10 MG/ML IJ SOLN
20.0000 mg | Freq: Once | INTRAMUSCULAR | Status: AC
  Administered 2016-06-11: 20 mg via INTRAVENOUS
  Filled 2016-06-11: qty 2

## 2016-06-11 NOTE — Discharge Instructions (Signed)

## 2016-06-11 NOTE — Progress Notes (Signed)
PCP: Latanya Presser, MD  Associated Diagnosis: Sickle Cell Anemia  Procedure Note: Transfusion of 2 units PRBCs   Condition During Procedure:Pt tolerated well   Condition at Discharge: Pt alert, oriented; no complications noted

## 2016-06-12 LAB — TYPE AND SCREEN
BLOOD PRODUCT EXPIRATION DATE: 201802202359
BLOOD PRODUCT EXPIRATION DATE: 201802222359
ISSUE DATE / TIME: 201801230903
ISSUE DATE / TIME: 201801230903
UNIT TYPE AND RH: 5100
UNIT TYPE AND RH: 5100

## 2016-06-27 ENCOUNTER — Telehealth: Payer: Self-pay | Admitting: Hematology

## 2016-08-21 ENCOUNTER — Ambulatory Visit: Payer: MEDICARE | Admitting: Podiatry

## 2016-08-26 ENCOUNTER — Encounter: Payer: Self-pay | Admitting: Podiatry

## 2016-08-26 ENCOUNTER — Ambulatory Visit (INDEPENDENT_AMBULATORY_CARE_PROVIDER_SITE_OTHER): Payer: MEDICARE | Admitting: Podiatry

## 2016-08-26 VITALS — Resp 16 | Ht 63.0 in | Wt 147.0 lb

## 2016-08-26 DIAGNOSIS — B353 Tinea pedis: Secondary | ICD-10-CM | POA: Diagnosis not present

## 2016-08-26 MED ORDER — TERBINAFINE HCL 250 MG PO TABS: 250.0000 mg | ORAL_TABLET | Freq: Every day | ORAL | 0 refills | Status: DC

## 2016-08-26 MED ORDER — CLOTRIMAZOLE-BETAMETHASONE 1-0.05 % EX CREA: 1.0000 "application " | TOPICAL_CREAM | Freq: Two times a day (BID) | CUTANEOUS | 0 refills | Status: DC

## 2016-08-26 NOTE — Progress Notes (Signed)
   Subjective: Patient is a 71 year old female presenting today as a new patient. She reports dry, discolored, cracking and bleeding to the plantar aspect of the bilateral feet that has been ongoing for the past 2 months. She denies any other complaints.   Objective/Physical Exam General: The patient is alert and oriented x3 in no acute distress.  Dermatology: Diffuse hyperkeratosis on the weightbearing surfaces of bilateral feet. Negative for open lesions or macerations.  Vascular: Palpable pedal pulses bilaterally. No edema or erythema noted. Capillary refill within normal limits.  Neurological: Epicritic and protective threshold grossly intact bilaterally.   Musculoskeletal Exam: Range of motion within normal limits to all pedal and ankle joints bilateral. Muscle strength 5/5 in all groups bilateral.       Assessment: #1 tinea pedis bilaterally #2 history of left foot fracture   Plan of Care:  #1 Patient was evaluated. #2 prescription for terbinafine 250 mg #28 #3 prescription for Lotrisone cream #4 recommended Vionic shoes/sandals #5 Urea 40% cream starting in 4 weeks #6 return to clinic in 4 weeks   Edrick Kins, DPM [facility]  Dr. Edrick Kins, [address]                                        [place], [place] [address]                Office [phone]  Fax [phone]

## 2016-08-27 ENCOUNTER — Ambulatory Visit (HOSPITAL_COMMUNITY)
Admission: RE | Admit: 2016-08-27 | Discharge: 2016-08-27 | Disposition: A | Discharge status: Home / Self Care | Payer: MEDICARE | Source: Ambulatory Visit | Attending: Internal Medicine | Admitting: Internal Medicine

## 2016-08-27 DIAGNOSIS — D571 Sickle-cell disease without crisis: Secondary | ICD-10-CM | POA: Insufficient documentation

## 2016-08-27 LAB — PREPARE RBC (CROSSMATCH)

## 2016-08-27 NOTE — Progress Notes (Signed)
Blood drawn and patient typed and crossed matched for 2 units of PRBC's. Blue bracelet placed on patient and patient advised not to take it off. Patient states an understanding. Patient for 2 units on tomorrow 4/11.

## 2016-08-28 ENCOUNTER — Ambulatory Visit (HOSPITAL_COMMUNITY)
Admission: RE | Admit: 2016-08-28 | Discharge: 2016-08-28 | Disposition: A | Discharge status: Home / Self Care | Payer: MEDICARE | Source: Ambulatory Visit | Attending: Internal Medicine | Admitting: Internal Medicine

## 2016-08-28 DIAGNOSIS — D571 Sickle-cell disease without crisis: Secondary | ICD-10-CM | POA: Diagnosis not present

## 2016-08-28 MED ORDER — ACETAMINOPHEN 325 MG PO TABS
650.0000 mg | ORAL_TABLET | Freq: Once | ORAL | Status: AC
  Administered 2016-08-28: 650 mg via ORAL
  Filled 2016-08-28: qty 2

## 2016-08-28 MED ORDER — FUROSEMIDE 10 MG/ML IJ SOLN
20.0000 mg | Freq: Once | INTRAMUSCULAR | Status: AC
  Administered 2016-08-28: 20 mg via INTRAVENOUS
  Filled 2016-08-28: qty 2

## 2016-08-28 MED ORDER — SODIUM CHLORIDE 0.9 % IV SOLN
Freq: Once | INTRAVENOUS | Status: AC
  Administered 2016-08-28: 09:00:00 via INTRAVENOUS

## 2016-08-28 MED ORDER — DIPHENHYDRAMINE HCL 25 MG PO CAPS
50.0000 mg | ORAL_CAPSULE | Freq: Once | ORAL | Status: AC
  Administered 2016-08-28: 50 mg via ORAL
  Filled 2016-08-28: qty 2

## 2016-08-28 NOTE — Discharge Instructions (Signed)

## 2016-08-28 NOTE — Progress Notes (Signed)
PCP: Latanya Presser, MD  Associated Diagnosis: Sickle Cell Anemia  Procedure Note: Transfusion of 2 units PRBCs and received $RemoveBefo'20mg'nbFubWLfJsN$  of Lasix between the 2 units.  Condition During Procedure:Pt tolerated well   Condition at Discharge: Pt alert, oriented; no complications noted

## 2016-08-29 LAB — BPAM RBC
BLOOD PRODUCT EXPIRATION DATE: 201804282359
BLOOD PRODUCT EXPIRATION DATE: 201804292359
ISSUE DATE / TIME: 201804110832
ISSUE DATE / TIME: 201804110832
Unit Type and Rh: 5100
Unit Type and Rh: 5100

## 2016-08-29 LAB — TYPE AND SCREEN
ABO/RH(D): B POS
Antibody Screen: NEGATIVE
Unit division: 0
Unit division: 0

## 2016-09-05 ENCOUNTER — Ambulatory Visit
Admission: RE | Admit: 2016-09-05 | Discharge: 2016-09-05 | Disposition: A | Discharge status: Home / Self Care | Payer: MEDICARE | Source: Ambulatory Visit | Attending: Internal Medicine | Admitting: Internal Medicine

## 2016-09-05 ENCOUNTER — Other Ambulatory Visit: Payer: Self-pay | Admitting: Internal Medicine

## 2016-09-05 DIAGNOSIS — R058 Other specified cough: Secondary | ICD-10-CM

## 2016-09-05 DIAGNOSIS — R062 Wheezing: Secondary | ICD-10-CM

## 2016-09-05 DIAGNOSIS — R05 Cough: Secondary | ICD-10-CM

## 2016-09-30 ENCOUNTER — Ambulatory Visit: Payer: MEDICARE | Admitting: Podiatry

## 2016-09-30 ENCOUNTER — Encounter (HOSPITAL_COMMUNITY): Payer: Self-pay | Admitting: Emergency Medicine

## 2016-09-30 ENCOUNTER — Emergency Department (HOSPITAL_COMMUNITY)
Admission: EM | Admit: 2016-09-30 | Discharge: 2016-09-30 | Disposition: A | Discharge status: Home / Self Care | Payer: MEDICARE | Attending: Emergency Medicine | Admitting: Emergency Medicine

## 2016-09-30 DIAGNOSIS — Z87891 Personal history of nicotine dependence: Secondary | ICD-10-CM | POA: Diagnosis not present

## 2016-09-30 DIAGNOSIS — D5700 Hb-SS disease with crisis, unspecified: Secondary | ICD-10-CM | POA: Insufficient documentation

## 2016-09-30 LAB — RETICULOCYTES
RBC.: 2.2 MIL/uL — AB (ref 3.87–5.11)
RETIC COUNT ABSOLUTE: 488.4 10*3/uL — AB (ref 19.0–186.0)
RETIC CT PCT: 22.2 % — AB (ref 0.4–3.1)

## 2016-09-30 LAB — COMPREHENSIVE METABOLIC PANEL
ALBUMIN: 4.4 g/dL (ref 3.5–5.0)
ALK PHOS: 126 U/L (ref 38–126)
ALT: 51 U/L (ref 14–54)
ANION GAP: 10 (ref 5–15)
AST: 83 U/L — ABNORMAL HIGH (ref 15–41)
BUN: 36 mg/dL — ABNORMAL HIGH (ref 6–20)
CALCIUM: 10.1 mg/dL (ref 8.9–10.3)
CO2: 19 mmol/L — AB (ref 22–32)
Chloride: 109 mmol/L (ref 101–111)
Creatinine, Ser: 2 mg/dL — ABNORMAL HIGH (ref 0.44–1.00)
GFR calc non Af Amer: 24 mL/min — ABNORMAL LOW (ref >60)
GFR, EST AFRICAN AMERICAN: 28 mL/min — AB (ref >60)
GLUCOSE: 96 mg/dL (ref 65–99)
POTASSIUM: 4.7 mmol/L (ref 3.5–5.1)
SODIUM: 138 mmol/L (ref 135–145)
TOTAL PROTEIN: 7.3 g/dL (ref 6.5–8.1)
Total Bilirubin: 4.7 mg/dL — ABNORMAL HIGH (ref 0.3–1.2)

## 2016-09-30 LAB — CBC WITH DIFFERENTIAL/PLATELET
BASOS ABS: 0.2 10*3/uL — AB (ref 0.0–0.1)
BASOS PCT: 1 %
EOS ABS: 0 10*3/uL (ref 0.0–0.7)
Eosinophils Relative: 0 %
HCT: 20.3 % — ABNORMAL LOW (ref 36.0–46.0)
Hemoglobin: 7.1 g/dL — ABNORMAL LOW (ref 12.0–15.0)
LYMPHS PCT: 14 %
Lymphs Abs: 2.5 10*3/uL (ref 0.7–4.0)
MCH: 32.3 pg (ref 26.0–34.0)
MCHC: 35 g/dL (ref 30.0–36.0)
MCV: 92.3 fL (ref 78.0–100.0)
MONO ABS: 2.1 10*3/uL — AB (ref 0.1–1.0)
MONOS PCT: 12 %
NEUTROS PCT: 73 %
Neutro Abs: 13.1 10*3/uL — ABNORMAL HIGH (ref 1.7–7.7)
PLATELETS: 243 10*3/uL (ref 150–400)
RBC: 2.2 MIL/uL — ABNORMAL LOW (ref 3.87–5.11)
RDW: 28.4 % — ABNORMAL HIGH (ref 11.5–15.5)
WBC: 17.9 10*3/uL — ABNORMAL HIGH (ref 4.0–10.5)
nRBC: 56 /100 WBC — ABNORMAL HIGH

## 2016-09-30 MED ORDER — HYDROMORPHONE HCL 1 MG/ML IJ SOLN: 2.0000 mg | INTRAMUSCULAR | Status: DC

## 2016-09-30 MED ORDER — HYDROMORPHONE HCL 1 MG/ML IJ SOLN
2.0000 mg | INTRAMUSCULAR | Status: AC
  Filled 2016-09-30: qty 2

## 2016-09-30 MED ORDER — HYDROMORPHONE HCL 1 MG/ML IJ SOLN: 2.0000 mg | INTRAMUSCULAR | Status: AC

## 2016-09-30 MED ORDER — HYDROMORPHONE HCL 1 MG/ML IJ SOLN
2.0000 mg | INTRAMUSCULAR | Status: DC
  Administered 2016-09-30 (×2): 2 mg via INTRAVENOUS
  Filled 2016-09-30: qty 2

## 2016-09-30 MED ORDER — HYDROMORPHONE HCL 1 MG/ML IJ SOLN
2.0000 mg | INTRAMUSCULAR | Status: AC
Start: 2016-09-30 — End: 2016-09-30
  Administered 2016-09-30: 2 mg via INTRAVENOUS
  Filled 2016-09-30: qty 2

## 2016-09-30 MED ORDER — KETOROLAC TROMETHAMINE 15 MG/ML IJ SOLN
15.0000 mg | INTRAMUSCULAR | Status: AC
  Administered 2016-09-30: 15 mg via INTRAVENOUS
  Filled 2016-09-30: qty 1

## 2016-09-30 MED ORDER — SODIUM CHLORIDE 0.45 % IV SOLN
INTRAVENOUS | Status: DC
  Administered 2016-09-30: 14:00:00 via INTRAVENOUS

## 2016-09-30 NOTE — ED Provider Notes (Signed)
Emergency Department Provider Note   I have reviewed the triage vital signs and the nursing notes.   HISTORY  Chief Complaint Sickle Cell Pain Crisis; Back Pain; Leg Pain; and Ankle Pain   HPI Chelsea Hensley is a 71 y.o. female with PMH of SS anemia presents to the emergency room in for evaluation of sickle cell crisis pain in her back, wrists, ankles. Symptoms have been ongoing for the past 2 days. She's been treating at home with pain medication and oral hydration with no relief. She went to see her hematologist today referred to the emergency department for pain control. Patient denies any chest pain or difficulty breathing at this time. She states that her pain feels similar to prior crisis pain. Denies any fever or shaking chills. No radiation of symptoms.    Past Medical History:  Diagnosis Date  . Gout   . Hyperuricemia   . Sickle cell anemia (HCC)   . 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 Anemia 12/02/2015  . Sickle cell anemia ([place]) 12/02/2015    Past Surgical History:  Procedure Laterality Date  . ABDOMINAL HYSTERECTOMY    . CATARACT EXTRACTION, BILATERAL    . CHOLECYSTECTOMY    . HERNIA REPAIR    . TOTAL ABDOMINAL HYSTERECTOMY W/ BILATERAL SALPINGOOPHORECTOMY Bilateral     Current Outpatient Rx  . Order #: [id number] Class: Normal  . Order #: [id number] Class: Normal  . Order #: [id number] Class: Historical Med  . Order #: [id number] Class: Historical Med  . Order #: [id number] Class: Normal  . Order #: [id number] Class: Historical Med  . Order #: [id number] Class: Historical Med  . Order #: [id number] Class: Historical Med  . Order #: [id number] Class: Print  . Order #: [id number] Class: Print  . Order #: [id number] Class: Historical Med  . Order #: [id number] Class: Normal    Allergies Quinolones  No family history on file.  Social History Social History  Substance  Use Topics  . Smoking status: Former Research scientist (life sciences)  . Smokeless tobacco: Never Used  . Alcohol use Yes     Comment: rare    Review of Systems  Constitutional: No fever/chills Eyes: No visual changes. ENT: No sore throat. Cardiovascular: Denies chest pain. Respiratory: Denies shortness of breath. Gastrointestinal: No abdominal pain.  No nausea, no vomiting.  No diarrhea.  No constipation. Genitourinary: Negative for dysuria. Musculoskeletal: Positive for back pain, ankle pain, and wrist pain.  Skin: Negative for rash. Neurological: Negative for headaches, focal weakness or numbness.  10-point ROS otherwise negative.  ____________________________________________   PHYSICAL EXAM:  VITAL SIGNS: ED Triage Vitals  Enc Vitals Group     BP 09/30/16 1410 122/62     Pulse Rate 09/30/16 1303 (!) 110     Resp 09/30/16 1303 19     Temp 09/30/16 1303 98.2 F (36.8 C)     Temp Source 09/30/16 1303 Oral     SpO2 09/30/16 1303 97 %     Weight 09/30/16 1304 150 lb (68 kg)     Height 09/30/16 1304 $RemoveBefor'5\' 3"'ORqZKBTsAstG$  (1.6 m)     Pain Score 09/30/16 1302 10   Constitutional: Alert and oriented. Well appearing and in no acute distress. Eyes: Conjunctivae are normal. Head: Atraumatic. Nose: No congestion/rhinnorhea. Mouth/Throat: Mucous membranes are moist.  Oropharynx non-erythematous. Neck: No stridor. Cardiovascular: Normal rate, regular rhythm. Good peripheral circulation. Grossly normal heart sounds.   Respiratory: Normal respiratory effort.  No  retractions. Lungs CTAB. Gastrointestinal: Soft and nontender. No distention.  Musculoskeletal: No lower extremity tenderness nor edema. No gross deformities of extremities. Neurologic:  Normal speech and language. No gross focal neurologic deficits are appreciated.  Skin:  Skin is warm, dry and intact. No rash noted. ____________________________________________   LABS (all labs ordered are listed, but only abnormal results are displayed)  Labs Reviewed    COMPREHENSIVE METABOLIC PANEL - Abnormal; Notable for the following:       Result Value   CO2 19 (*)    BUN 36 (*)    Creatinine, Ser 2.00 (*)    AST 83 (*)    Total Bilirubin 4.7 (*)    GFR calc non Af Amer 24 (*)    GFR calc Af Amer 28 (*)    All other components within normal limits  CBC WITH DIFFERENTIAL/PLATELET - Abnormal; Notable for the following:    WBC 17.9 (*)    RBC 2.20 (*)    Hemoglobin 7.1 (*)    HCT 20.3 (*)    RDW 28.4 (*)    nRBC 56 (*)    Neutro Abs 13.1 (*)    Monocytes Absolute 2.1 (*)    Basophils Absolute 0.2 (*)    All other components within normal limits  RETICULOCYTES - Abnormal; Notable for the following:    Retic Ct Pct 22.2 (*)    RBC. 2.20 (*)    Retic Count, Manual 488.4 (*)    All other components within normal limits   ____________________________________________   PROCEDURES  Procedure(s) performed:   Procedures  None ____________________________________________   INITIAL IMPRESSION / ASSESSMENT AND PLAN / ED COURSE  Pertinent labs & imaging results that were available during my care of the patient were reviewed by me and considered in my medical decision making (see chart for details).  Patient resents to the emergency room in for evaluation of sickle cell crisis pain. No chest pain or hypoxemia to suggest PE or acute chest. No fever or chills. Plan for labs, pain control, IV fluids and reassessment.  03:50 PM Patient feeling better after pain medication. Will reassess. No hypoxemia or chest pain. Patient's leukocytosis is likely from acute sickle cell crisis.   At this time, I do not feel there is any life-threatening condition present. I have reviewed and discussed all results (EKG, imaging, lab, urine as appropriate), exam findings with patient. I have reviewed nursing notes and appropriate previous records.  I feel the patient is safe to be discharged home without further emergent workup. Discussed usual and customary return  precautions. Patient and family (if present) verbalize understanding and are comfortable with this plan.  Patient will follow-up with their primary care provider. If they do not have a primary care provider, information for follow-up has been provided to them. All questions have been answered.  ____________________________________________  FINAL CLINICAL IMPRESSION(S) / ED DIAGNOSES  Final diagnoses:  Sickle cell crisis ([place])     MEDICATIONS GIVEN DURING THIS VISIT:  Medications  0.45 % sodium chloride infusion ( Intravenous Stopped 09/30/16 1642)  HYDROmorphone (DILAUDID) injection 2 mg (not administered)    Or  HYDROmorphone (DILAUDID) injection 2 mg (not administered)  HYDROmorphone (DILAUDID) injection 2 mg (not administered)    Or  HYDROmorphone (DILAUDID) injection 2 mg (not administered)  HYDROmorphone (DILAUDID) injection 2 mg (2 mg Intravenous Given 09/30/16 1629)    Or  HYDROmorphone (DILAUDID) injection 2 mg ( Subcutaneous See Alternative 09/30/16 1629)  ketorolac (TORADOL) 15 MG/ML injection 15  mg (15 mg Intravenous Given 09/30/16 1346)  HYDROmorphone (DILAUDID) injection 2 mg (2 mg Intravenous Given 09/30/16 1347)    Or  HYDROmorphone (DILAUDID) injection 2 mg ( Subcutaneous See Alternative 09/30/16 1347)     NEW OUTPATIENT MEDICATIONS STARTED DURING THIS VISIT:  None   Note:  This document was prepared using Dragon voice recognition software and may include unintentional dictation errors.  Nanda Quinton, MD Emergency Medicine   Nikolis Berent, [facility], MD 09/30/16 [phone]

## 2016-09-30 NOTE — Discharge Instructions (Signed)
You were seen in the ED today with sickle cell crisis pain. We were able to treat your pain. Return to the ED with any chest pain, difficulty breathing, fever, or severe pain that does not respond to home medications.

## 2016-09-30 NOTE — ED Triage Notes (Signed)
Patient c/o sickle cell pain in lower back, bilat arms,/wrist, legs/ankles that going on since yesterday. Patient reports her MD is supposed to be sending paperwork  To ED.

## 2016-09-30 NOTE — ED Notes (Signed)
Pt placed on 4 L  for O2 sat of 75% RA following ambulating to the bathroom. Pt returned to 95% on 4L

## 2016-09-30 NOTE — ED Notes (Signed)
Pt weaned back down to 2L and is maintaining her oxygen sat at 92% while resting

## 2016-11-11 ENCOUNTER — Ambulatory Visit (HOSPITAL_COMMUNITY)
Admission: RE | Admit: 2016-11-11 | Discharge: 2016-11-11 | Disposition: A | Discharge status: Home / Self Care | Payer: MEDICARE | Source: Ambulatory Visit | Attending: Internal Medicine | Admitting: Internal Medicine

## 2016-11-11 DIAGNOSIS — D571 Sickle-cell disease without crisis: Secondary | ICD-10-CM | POA: Insufficient documentation

## 2016-11-11 LAB — PREPARE RBC (CROSSMATCH)

## 2016-11-11 MED ORDER — SODIUM CHLORIDE 0.9 % IV SOLN: Freq: Once | INTRAVENOUS | Status: DC

## 2016-11-11 NOTE — Progress Notes (Signed)
Pt arrived for type and screen prior to receiving 2 units PRBCs; pt scheduled to return in AM for transfusion; blood sample delivered to blood bank and blood armband placed on pt.; pt educated about continuing to wear band until transfusion has been completed; no complications noted

## 2016-11-12 ENCOUNTER — Ambulatory Visit (HOSPITAL_COMMUNITY)
Admission: RE | Admit: 2016-11-12 | Discharge: 2016-11-12 | Disposition: A | Discharge status: Home / Self Care | Payer: MEDICARE | Source: Ambulatory Visit | Attending: Internal Medicine | Admitting: Internal Medicine

## 2016-11-12 DIAGNOSIS — D571 Sickle-cell disease without crisis: Secondary | ICD-10-CM | POA: Diagnosis not present

## 2016-11-12 MED ORDER — FUROSEMIDE 20 MG PO TABS
20.0000 mg | ORAL_TABLET | Freq: Once | ORAL | Status: AC
  Administered 2016-11-12: 20 mg via ORAL
  Filled 2016-11-12: qty 1

## 2016-11-12 MED ORDER — DIPHENHYDRAMINE HCL 25 MG PO CAPS
50.0000 mg | ORAL_CAPSULE | Freq: Once | ORAL | Status: AC
  Administered 2016-11-12: 50 mg via ORAL
  Filled 2016-11-12: qty 2

## 2016-11-12 MED ORDER — SODIUM CHLORIDE 0.9 % IV SOLN
Freq: Once | INTRAVENOUS | Status: AC
  Administered 2016-11-12: 11:00:00 via INTRAVENOUS

## 2016-11-12 MED ORDER — ACETAMINOPHEN 325 MG PO TABS
650.0000 mg | ORAL_TABLET | Freq: Once | ORAL | Status: AC
  Administered 2016-11-12: 650 mg via ORAL
  Filled 2016-11-12: qty 2

## 2016-11-12 NOTE — Discharge Instructions (Signed)

## 2016-11-12 NOTE — Progress Notes (Signed)
PCP: Latanya Presser, MD  Associated Diagnosis: Sickle Cell Anemia  Procedure Note: Transfusion of 2 units PRBCs and received $RemoveBefo'20mg'FVmlVIdDDjq$  po Lasix between each 2 units.  Condition During Procedure:Patient tolerated well   Condition at Discharge: Patient alert, oriented; no complications noted.

## 2016-11-13 ENCOUNTER — Other Ambulatory Visit: Payer: Self-pay | Admitting: Internal Medicine

## 2016-11-13 DIAGNOSIS — R0602 Shortness of breath: Secondary | ICD-10-CM

## 2016-11-13 LAB — TYPE AND SCREEN
ABO/RH(D): B POS
ANTIBODY SCREEN: NEGATIVE
UNIT DIVISION: 0
Unit division: 0

## 2016-11-13 LAB — BPAM RBC
BLOOD PRODUCT EXPIRATION DATE: 201807132359
Blood Product Expiration Date: 201807132359
ISSUE DATE / TIME: 201806261013
ISSUE DATE / TIME: 201806261013
UNIT TYPE AND RH: 5100
Unit Type and Rh: 5100

## 2016-11-14 ENCOUNTER — Ambulatory Visit (INDEPENDENT_AMBULATORY_CARE_PROVIDER_SITE_OTHER): Payer: MEDICARE | Admitting: Internal Medicine

## 2016-11-14 DIAGNOSIS — R0602 Shortness of breath: Secondary | ICD-10-CM

## 2016-11-14 LAB — PULMONARY FUNCTION TEST
DL/VA % pred: 74 %
DL/VA: 3.34 ml/min/mmHg/L
DLCO UNC % PRED: 48 %
DLCO unc: 10.36 ml/min/mmHg
FEF 25-75 POST: 1.01 L/s
FEF 25-75 Pre: 0.92 L/sec
FEF2575-%CHANGE-POST: 9 %
FEF2575-%PRED-POST: 67 %
FEF2575-%Pred-Pre: 61 %
FEV1-%Change-Post: 3 %
FEV1-%PRED-PRE: 69 %
FEV1-%Pred-Post: 72 %
FEV1-POST: 1.16 L
FEV1-Pre: 1.12 L
FEV1FVC-%CHANGE-POST: 5 %
FEV1FVC-%PRED-PRE: 100 %
FEV6-%Change-Post: 0 %
FEV6-%Pred-Post: 71 %
FEV6-%Pred-Pre: 71 %
FEV6-PRE: 1.43 L
FEV6-Post: 1.43 L
FEV6FVC-%Change-Post: 1 %
FEV6FVC-%Pred-Post: 104 %
FEV6FVC-%Pred-Pre: 102 %
FVC-%CHANGE-POST: -1 %
FVC-%PRED-POST: 68 %
FVC-%Pred-Pre: 69 %
FVC-PRE: 1.46 L
FVC-Post: 1.43 L
POST FEV1/FVC RATIO: 81 %
Post FEV6/FVC ratio: 100 %
Pre FEV1/FVC ratio: 77 %
Pre FEV6/FVC Ratio: 98 %
RV % PRED: 79 %
RV: 1.66 L
TLC % pred: 69 %
TLC: 3.26 L

## 2016-11-14 NOTE — Progress Notes (Signed)
PFT done today. 

## 2016-12-24 ENCOUNTER — Encounter: Payer: Self-pay | Admitting: Internal Medicine

## 2016-12-24 ENCOUNTER — Ambulatory Visit (INDEPENDENT_AMBULATORY_CARE_PROVIDER_SITE_OTHER): Payer: MEDICARE | Admitting: Internal Medicine

## 2016-12-24 VITALS — BP 124/64 | HR 96 | Ht 63.5 in | Wt 146.0 lb

## 2016-12-24 DIAGNOSIS — R0609 Other forms of dyspnea: Secondary | ICD-10-CM

## 2016-12-24 NOTE — Patient Instructions (Signed)
Please see patient coordinator before you leave today  to schedule overnight oximetry on Room air and we will call you with results

## 2016-12-24 NOTE — Progress Notes (Signed)
Subjective:     Patient ID: Chelsea Hensley, female   DOB: [birth year],    MRN: [id number]  HPI  54 yobf quit smoking 12/2006  with mulitiple admits with Sickle crises in [place] and no significant resp issues until around 2012 with sob with crises and then starting 2017 more consistently doe  with activity so referred to pulmonary clinic 12/24/2016 by Dr   Corky Downs     12/24/2016 1st [facility] office visit/ Chelsea Hensley   Chief Complaint  Patient presents with  . Advice Only    Referred by Dr. Corky Downs for COPD. Pt had a PFT 11/14/16. Pt states that breathing is still about the same as before. Due to sickle-cell, pt becomes SOB easily. Denies any coughing or CP.  last crisis was May 2018 hgb 7.1 On best days MMRC3 = can't walk 100 yards even at a slow pace at a flat grade s stopping due to sob   Does not have 02  / tried spiriva/breo no better either one or both    No obvious day to day or daytime variability or assoc excess/ purulent sputum or mucus plugs or hemoptysis or cp or chest tightness, subjective wheeze or overt sinus or hb symptoms. No unusual exp hx or h/o childhood pna/ asthma or knowledge of premature birth.  Sleeping ok without nocturnal  or early am exacerbation  of respiratory  c/o's or need for noct saba. Also denies any obvious fluctuation of symptoms with weather or environmental changes or other aggravating or alleviating factors except as outlined above   Current Medications, Allergies, Complete Past Medical History, Past Surgical History, Family History, and Social History were reviewed in [employer] record.  ROS  The following are not active complaints unless bolded sore throat, dysphagia, dental problems, itching, sneezing,  nasal congestion or excess/ purulent secretions, ear ache,   fever, chills, sweats, unintended wt loss, classically pleuritic or exertional cp,  orthopnea pnd or leg swelling, presyncope, palpitations, abdominal pain,  anorexia, nausea, vomiting, diarrhea  or change in bowel or bladder habits, change in stools or urine, dysuria,hematuria,  rash, arthralgias, visual complaints, headache, numbness, weakness or ataxia or problems with walking or coordination,  change in mood/affect or memory.           Review of Systems     Objective:   Physical Exam amb anxious bf nad   Wt Readings from Last 3 Encounters:  12/24/16 146 lb (66.2 kg)  09/30/16 150 lb (68 kg)  08/26/16 147 lb (66.7 kg)    Vital signs reviewed - Note on arrival 02 sats  99% on RA     HEENT: nl dentition, turbinates bilaterally, and oropharynx. Nl external ear canals without cough reflex   NECK :  without JVD/Nodes/TM/ nl carotid upstrokes bilaterally   LUNGS: no acc muscle use,  Nl contour chest which is clear to A and P bilaterally without cough on insp or exp maneuvers   CV:  RRR  no s3 or murmur or increase in P2, and no edema   ABD:  soft and nontender with nl inspiratory excursion in the supine position. No bruits or organomegaly appreciated, bowel sounds nl  MS:  Nl gait/ ext warm without deformities, calf tenderness, cyanosis or clubbing No obvious joint restrictions   SKIN: warm and dry without lesions    NEURO:  alert, approp, nl sensorium with  no motor or cerebellar deficits apparent.      I personally reviewed images and agree with  radiology impression as follows:  CXR:   09/05/16  1. Chronic cardiomegaly, stable. Atherosclerosis of the aortic arch. 2. Chronic increased interstitial opacities. Stable bibasilar scarring. No consolidation. 3. Overall, no significant change from radiographs September 2017.     Assessment:

## 2016-12-25 NOTE — Assessment & Plan Note (Addendum)
Echo 12/05/2015 PA peak pressure: 38 mm Hg sys - PFT's 11/14/2016  FEV1 1.16 (72 % ) ratio 81  p 3 % improvement from saba p nothing prior to study with DLCO  48 % corrects to 74 % for alv volume  - no correction for hgb-   ERV = 51% - 12/24/2016  Walked RA x 1 laps = 185 ft   stopped due to sob with sats 92% nl pace   She may have very mild sickle chest / PH but her main problem is related to anemia/ deconditioning at this pont and the only thing we can offer here, esp since already tried and failed LAMA/ LABA and ICS is to be sure she understands appropriate pacing and we make sure that her sats stay above 90% at all times, esp when she sleeps  Next step is therefore ONO RA and rx with 02 if < 89% x > 5 min   Total time devoted to counseling  > 50 % of initial 60 min office visit:  review case with pt/extensive notes in EPIC,  discussion of options/alternatives/ personally creating written customized instructions  in presence of pt  then going over those specific  Instructions directly with the pt including how to use all of the meds but in particular covering each new medication in detail and the difference between the maintenance= "automatic" meds and the prns using an action plan format for the latter (If this problem/symptom => do that organization reading Left to right).  Please see AVS from this visit for a full list of these instructions which I personally wrote for this pt and  are unique to this visit.   Marland Kitchen

## 2016-12-29 ENCOUNTER — Encounter: Payer: Self-pay | Admitting: Internal Medicine

## 2017-01-01 ENCOUNTER — Telehealth: Payer: Self-pay | Admitting: Internal Medicine

## 2017-01-01 DIAGNOSIS — G4734 Idiopathic sleep related nonobstructive alveolar hypoventilation: Secondary | ICD-10-CM

## 2017-01-01 DIAGNOSIS — R0609 Other forms of dyspnea: Principal | ICD-10-CM

## 2017-01-01 NOTE — Telephone Encounter (Signed)
Spoke with patient. She is aware of MW's recs. Wants to proceed with the O2 order and second ONO order.   MW, how soon did you want the patient to have the 2nd ONO with oxygen? Please advise. Thanks!

## 2017-01-01 NOTE — Addendum Note (Signed)
Addended by: Valerie Salts on: 01/01/2017 01:44 PM   Modules accepted: Orders

## 2017-01-01 NOTE — Telephone Encounter (Signed)
Patient returned call, CB is [phone].

## 2017-01-01 NOTE — Telephone Encounter (Signed)
Order has been placed. Nothing else needed.

## 2017-01-01 NOTE — Telephone Encounter (Signed)
Give her a couple weeks to get used to wearing it all night

## 2017-01-01 NOTE — Telephone Encounter (Signed)
Per MW: ONO on room air showed O2 desaturation.  Need to start pt on 2lpm qhs and repeat ONO on 2lpm after starting nocturnal O2.    lmtcb X1 for pt to make aware of results/recs.  Will order O2 and ONO after speaking to pt.

## 2017-01-10 ENCOUNTER — Ambulatory Visit (HOSPITAL_COMMUNITY)
Admission: RE | Admit: 2017-01-10 | Discharge: 2017-01-10 | Disposition: A | Discharge status: Home / Self Care | Payer: MEDICARE | Source: Ambulatory Visit | Attending: Internal Medicine | Admitting: Internal Medicine

## 2017-01-10 DIAGNOSIS — D571 Sickle-cell disease without crisis: Secondary | ICD-10-CM | POA: Insufficient documentation

## 2017-01-10 LAB — PREPARE RBC (CROSSMATCH)

## 2017-01-10 NOTE — Procedures (Signed)
[facility]  Procedure Note  Chelsea Hensley OPF:[id number] DOB: [birth year] DOA: 01/10/2017   PCP: Audley Hose, MD   Associated Diagnosis: Symptomatic anemia  Procedure Note: patient here today to have blood drawn for type&screen.  Patient will return Monday for blood transfusion.  Blue Blood Band placed on patient and instructions given to leave in place until she returns on Monday.  Patient verbalizes understanding.   Condition During Procedure: stable   Condition at Discharge: stable   [name], Rexene Edison, [facility]

## 2017-01-13 ENCOUNTER — Ambulatory Visit (HOSPITAL_COMMUNITY)
Admission: RE | Admit: 2017-01-13 | Discharge: 2017-01-13 | Disposition: A | Discharge status: Home / Self Care | Payer: MEDICARE | Source: Ambulatory Visit | Attending: Internal Medicine | Admitting: Internal Medicine

## 2017-01-13 DIAGNOSIS — D571 Sickle-cell disease without crisis: Secondary | ICD-10-CM | POA: Diagnosis not present

## 2017-01-13 MED ORDER — DIPHENHYDRAMINE HCL 25 MG PO CAPS
50.0000 mg | ORAL_CAPSULE | Freq: Once | ORAL | Status: AC
  Administered 2017-01-13: 50 mg via ORAL
  Filled 2017-01-13: qty 2

## 2017-01-13 MED ORDER — ACETAMINOPHEN 325 MG PO TABS
650.0000 mg | ORAL_TABLET | Freq: Once | ORAL | Status: AC
  Administered 2017-01-13: 650 mg via ORAL
  Filled 2017-01-13: qty 2

## 2017-01-13 MED ORDER — SODIUM CHLORIDE 0.9 % IV SOLN
Freq: Once | INTRAVENOUS | Status: AC
  Administered 2017-01-13: 09:00:00 via INTRAVENOUS

## 2017-01-13 MED ORDER — DIPHENHYDRAMINE HCL 50 MG/ML IJ SOLN: 50.0000 mg | Freq: Once | INTRAMUSCULAR | Status: DC

## 2017-01-13 MED ORDER — FUROSEMIDE 10 MG/ML IJ SOLN: 20.0000 mg | Freq: Once | INTRAMUSCULAR | Status: DC

## 2017-01-13 MED ORDER — FUROSEMIDE 20 MG PO TABS
20.0000 mg | ORAL_TABLET | Freq: Once | ORAL | Status: AC
  Administered 2017-01-13: 20 mg via ORAL
  Filled 2017-01-13: qty 1

## 2017-01-13 NOTE — Progress Notes (Signed)
PCP: Latanya Presser, MD  Associated Diagnosis: Sickle Cell Anemia  Procedure Note: Transfusion of 2 units PRBCs and received $RemoveBefo'20mg'LBfGlOcOfwI$  of Lasix between the 2 units.  Condition During Procedure:Pt tolerated well   Condition at Discharge: Pt alert, oriented; no complications noted. Pt received discharge instructions with verbal understanding.

## 2017-01-13 NOTE — Discharge Instructions (Signed)

## 2017-01-14 LAB — TYPE AND SCREEN
ABO/RH(D): B POS
ANTIBODY SCREEN: NEGATIVE
Unit division: 0
Unit division: 0

## 2017-01-14 LAB — BPAM RBC
BLOOD PRODUCT EXPIRATION DATE: 201809182359
BLOOD PRODUCT EXPIRATION DATE: 201809232359
ISSUE DATE / TIME: 201808270841
ISSUE DATE / TIME: 201808270841
UNIT TYPE AND RH: 5100
Unit Type and Rh: 5100

## 2017-01-16 ENCOUNTER — Encounter: Payer: Self-pay | Admitting: Internal Medicine

## 2017-02-21 ENCOUNTER — Ambulatory Visit
Admission: RE | Admit: 2017-02-21 | Discharge: 2017-02-21 | Disposition: A | Discharge status: Home / Self Care | Payer: MEDICARE | Source: Ambulatory Visit | Attending: Internal Medicine | Admitting: Internal Medicine

## 2017-02-21 ENCOUNTER — Other Ambulatory Visit: Payer: Self-pay | Admitting: Internal Medicine

## 2017-02-21 DIAGNOSIS — R52 Pain, unspecified: Secondary | ICD-10-CM

## 2017-02-25 ENCOUNTER — Ambulatory Visit (HOSPITAL_COMMUNITY)
Admission: RE | Admit: 2017-02-25 | Discharge: 2017-02-25 | Disposition: A | Discharge status: Home / Self Care | Payer: MEDICARE | Source: Ambulatory Visit | Attending: Internal Medicine | Admitting: Internal Medicine

## 2017-02-25 DIAGNOSIS — D571 Sickle-cell disease without crisis: Secondary | ICD-10-CM | POA: Insufficient documentation

## 2017-02-25 MED ORDER — FUROSEMIDE 20 MG PO TABS
20.0000 mg | ORAL_TABLET | Freq: Once | ORAL | Status: DC
  Filled 2017-02-25: qty 1

## 2017-02-25 MED ORDER — ACETAMINOPHEN 325 MG PO TABS: 650.0000 mg | ORAL_TABLET | Freq: Once | ORAL | Status: DC

## 2017-02-25 MED ORDER — SODIUM CHLORIDE 0.9 % IV SOLN: Freq: Once | INTRAVENOUS | Status: DC

## 2017-02-25 MED ORDER — DIPHENHYDRAMINE HCL 25 MG PO CAPS: 50.0000 mg | ORAL_CAPSULE | Freq: Once | ORAL | Status: DC

## 2017-02-25 NOTE — Progress Notes (Signed)
Provider: Leretha Pol   Diagnosis: Sickle Cell Anemia  Treatment: Type and Screen  Patient typed and Screened for 2 units in which she will receive on tomorrow. Blue blood bank bractlet placed on patient. Patient advised not to remove bracelet in order to receive blood on tomorrow 10/10. Patient states an understanding.

## 2017-02-26 ENCOUNTER — Ambulatory Visit (HOSPITAL_COMMUNITY)
Admission: RE | Admit: 2017-02-26 | Discharge: 2017-02-26 | Disposition: A | Discharge status: Home / Self Care | Payer: MEDICARE | Source: Ambulatory Visit | Attending: Internal Medicine | Admitting: Internal Medicine

## 2017-02-26 DIAGNOSIS — D571 Sickle-cell disease without crisis: Secondary | ICD-10-CM | POA: Diagnosis not present

## 2017-02-26 LAB — PREPARE RBC (CROSSMATCH)

## 2017-02-26 MED ORDER — FUROSEMIDE 20 MG PO TABS
20.0000 mg | ORAL_TABLET | Freq: Once | ORAL | Status: AC
  Administered 2017-02-26: 20 mg via ORAL
  Filled 2017-02-26 (×2): qty 1

## 2017-02-26 MED ORDER — SODIUM CHLORIDE 0.9 % IV SOLN
Freq: Once | INTRAVENOUS | Status: AC
  Administered 2017-02-26: 09:00:00 via INTRAVENOUS

## 2017-02-26 MED ORDER — DIPHENHYDRAMINE HCL 25 MG PO CAPS
50.0000 mg | ORAL_CAPSULE | Freq: Once | ORAL | Status: AC
  Administered 2017-02-26: 50 mg via ORAL
  Filled 2017-02-26: qty 2

## 2017-02-26 MED ORDER — ACETAMINOPHEN 325 MG PO TABS
650.0000 mg | ORAL_TABLET | Freq: Once | ORAL | Status: AC
  Administered 2017-02-26: 650 mg via ORAL
  Filled 2017-02-26: qty 2

## 2017-02-26 NOTE — Discharge Instructions (Signed)

## 2017-02-26 NOTE — Progress Notes (Signed)
Diagnosis: Sickle Cell Anemia  Provider: Leretha Pol, MD   Procedure: Patient received 2 units of PRBCs via piv.  Patienttolerated well.  Post procedure: Patient alert, oriented and ambulatory at discharge.

## 2017-02-27 LAB — TYPE AND SCREEN
ABO/RH(D): B POS
Antibody Screen: NEGATIVE
Unit division: 0
Unit division: 0

## 2017-02-27 LAB — BPAM RBC
Blood Product Expiration Date: 201810292359
Blood Product Expiration Date: 201811012359
ISSUE DATE / TIME: 201810100831
ISSUE DATE / TIME: 201810100831
UNIT TYPE AND RH: 5100
Unit Type and Rh: 5100

## 2017-03-04 ENCOUNTER — Telehealth: Payer: Self-pay | Admitting: Internal Medicine

## 2017-03-04 DIAGNOSIS — R0609 Other forms of dyspnea: Principal | ICD-10-CM

## 2017-03-04 NOTE — Telephone Encounter (Signed)
Called [facility] to see if they have the latest results. The office is currently at lunch until after 1. Will call back then to see if they have the results from the 2nd O2 test.

## 2017-03-04 NOTE — Telephone Encounter (Signed)
Spoke with pt, she states she would like the pulse oximetry test.  She would like [place] to fax over the results to her doctor below when Dr. Melvyn Novas advises his interpretation. MW please advise.   Dr. Maia Petties

## 2017-03-04 NOTE — Telephone Encounter (Signed)
Per my last entry   - ONO RA  Done 12/29/16 :  desat x 170 min < 89% rec 01/01/2017  2lpm and repeat study on 2lpm  (which I have not seen)

## 2017-03-04 NOTE — Telephone Encounter (Signed)
Spoke with Raven at [facility] to ask for a copy of the most recent ONO. She stated that someone was already in the process of faxing the latest one over to the office.   Will hold this message in triage until the fax is received.

## 2017-03-05 NOTE — Telephone Encounter (Signed)
Looks good on 2lpm , no change rx

## 2017-03-05 NOTE — Telephone Encounter (Signed)
Per Jonelle Sidle, these ONO results were received yesterday.   MW, please advise. Thanks!

## 2017-03-05 NOTE — Telephone Encounter (Signed)
Spoke with patient. She is aware of results.   Nothing further needed at time of call.

## 2017-03-31 ENCOUNTER — Telehealth: Payer: Self-pay | Admitting: Internal Medicine

## 2017-03-31 NOTE — Telephone Encounter (Signed)
Spoke with pt, aware of recs.  Pt states that she recently had an ONO, wants to know if this needs repeated.  I advised that I would look for this information and call her back.  In chart, pt had an ONO on room air in August which showed desaturation, and test was repeated on 2lpm which kept her within normal O2 limits.    Called pt back, had to lmtcb.  Wcb- need to make aware that her last test showed O2 desaturation on room air, which is why MW wants to repeat test before d/c'ing O2.

## 2017-03-31 NOTE — Telephone Encounter (Signed)
Before I can sign off on this change rec ono RA and if ok then d/c 02 - the POC has nothing to do with the noct sats or desats

## 2017-03-31 NOTE — Telephone Encounter (Signed)
Spoke with pt, she states Dr Nena Alexander stated she needed to discontinue her oxygen at night because it is not helping her. She still wake at night and she is still SOB. She does not want the overnight oxygen anymore. Can we discontinue overnight oxygen MW?

## 2017-04-01 NOTE — Telephone Encounter (Signed)
Patient returned call, CB [phone]

## 2017-04-01 NOTE — Telephone Encounter (Signed)
ATC pt, no answer. Left message for pt to call back.  

## 2017-04-01 NOTE — Telephone Encounter (Signed)
Pt returning call. Was driving at the time and could not answer before.-tr

## 2017-04-01 NOTE — Telephone Encounter (Signed)
Spoke with pt, aware of recs.  Pt is refusing to repeat ONO on room air at this time, states she will call her insurance company herself to d/c her O2 at her own request.  Will close encounter.

## 2017-04-01 NOTE — Telephone Encounter (Signed)
lmtcb X2 for pt.  

## 2017-04-15 ENCOUNTER — Ambulatory Visit (HOSPITAL_COMMUNITY)
Admission: RE | Admit: 2017-04-15 | Discharge: 2017-04-15 | Disposition: A | Discharge status: Home / Self Care | Payer: MEDICARE | Source: Ambulatory Visit | Attending: Internal Medicine | Admitting: Internal Medicine

## 2017-04-15 DIAGNOSIS — D649 Anemia, unspecified: Secondary | ICD-10-CM | POA: Insufficient documentation

## 2017-04-15 DIAGNOSIS — D571 Sickle-cell disease without crisis: Secondary | ICD-10-CM | POA: Diagnosis present

## 2017-04-15 LAB — PREPARE RBC (CROSSMATCH)

## 2017-04-15 MED ORDER — SODIUM CHLORIDE 0.9 % IV SOLN: Freq: Once | INTRAVENOUS | Status: DC

## 2017-04-15 NOTE — Progress Notes (Signed)
Pt admitted to patient care center for Type and Screen blood draw. Sample obtained without complication. Education done with pt regarding transfusion tomorrow. Pt denies questions or concerns at this time. Discharged in stable, ambulatory condition.  Coolidge Breeze, RN 04/15/2017

## 2017-04-16 ENCOUNTER — Ambulatory Visit (HOSPITAL_COMMUNITY)
Admission: RE | Admit: 2017-04-16 | Discharge: 2017-04-16 | Disposition: A | Discharge status: Home / Self Care | Payer: MEDICARE | Source: Ambulatory Visit | Attending: Internal Medicine | Admitting: Internal Medicine

## 2017-04-16 DIAGNOSIS — D649 Anemia, unspecified: Secondary | ICD-10-CM | POA: Diagnosis not present

## 2017-04-16 LAB — PREPARE RBC (CROSSMATCH)

## 2017-04-16 MED ORDER — FUROSEMIDE 10 MG/ML IJ SOLN
40.0000 mg | Freq: Once | INTRAMUSCULAR | Status: AC
  Administered 2017-04-16: 40 mg via INTRAVENOUS
  Filled 2017-04-16: qty 4

## 2017-04-16 MED ORDER — ACETAMINOPHEN 325 MG PO TABS
650.0000 mg | ORAL_TABLET | Freq: Once | ORAL | Status: AC
  Administered 2017-04-16: 650 mg via ORAL
  Filled 2017-04-16: qty 2

## 2017-04-16 MED ORDER — SODIUM CHLORIDE 0.9 % IV SOLN: Freq: Once | INTRAVENOUS | Status: DC

## 2017-04-16 MED ORDER — DIPHENHYDRAMINE HCL 25 MG PO CAPS
50.0000 mg | ORAL_CAPSULE | Freq: Once | ORAL | Status: AC
  Administered 2017-04-16: 50 mg via ORAL
  Filled 2017-04-16: qty 2

## 2017-04-16 NOTE — Progress Notes (Signed)
Diagnosis: Sickle Cell Anemia  Provider: Leretha Pol, MD   Procedure: Patient received 2 units of PRBCs via piv.  Patienttolerated well.  Post procedure: Patient alert, oriented and ambulatory at discharge.

## 2017-04-16 NOTE — Discharge Instructions (Signed)

## 2017-04-17 LAB — TYPE AND SCREEN
ABO/RH(D): B POS
ANTIBODY SCREEN: NEGATIVE
UNIT DIVISION: 0
Unit division: 0

## 2017-04-17 LAB — BPAM RBC
Blood Product Expiration Date: 201812212359
Blood Product Expiration Date: 201812212359
ISSUE DATE / TIME: 201811280852
ISSUE DATE / TIME: 201811280852
Unit Type and Rh: 5100
Unit Type and Rh: 5100

## 2017-05-30 ENCOUNTER — Ambulatory Visit (HOSPITAL_COMMUNITY)
Admission: RE | Admit: 2017-05-30 | Discharge: 2017-05-30 | Disposition: A | Discharge status: Home / Self Care | Payer: MEDICARE | Source: Ambulatory Visit | Attending: Internal Medicine | Admitting: Internal Medicine

## 2017-05-30 ENCOUNTER — Encounter (HOSPITAL_COMMUNITY): Payer: Self-pay

## 2017-05-30 DIAGNOSIS — D571 Sickle-cell disease without crisis: Secondary | ICD-10-CM | POA: Diagnosis not present

## 2017-05-30 LAB — FERRITIN: FERRITIN: 603 ng/mL — AB (ref 11–307)

## 2017-05-30 LAB — RETICULOCYTES
RBC.: 2.06 MIL/uL — ABNORMAL LOW (ref 3.87–5.11)
Retic Ct Pct: >23 % — ABNORMAL HIGH (ref 0.4–3.1)

## 2017-05-30 LAB — CBC WITH DIFFERENTIAL/PLATELET
BASOS PCT: 0 %
Basophils Absolute: 0 10*3/uL (ref 0.0–0.1)
Eosinophils Absolute: 1 10*3/uL — ABNORMAL HIGH (ref 0.0–0.7)
Eosinophils Relative: 9 %
HEMATOCRIT: 18 % — AB (ref 36.0–46.0)
Hemoglobin: 6.3 g/dL — CL (ref 12.0–15.0)
LYMPHS ABS: 4.2 10*3/uL — AB (ref 0.7–4.0)
Lymphocytes Relative: 38 %
MCH: 30.6 pg (ref 26.0–34.0)
MCHC: 35 g/dL (ref 30.0–36.0)
MCV: 87.4 fL (ref 78.0–100.0)
MONO ABS: 0.6 10*3/uL (ref 0.1–1.0)
MONOS PCT: 5 %
MYELOCYTES: 2 %
NRBC: 40 /100{WBCs} — AB
Neutro Abs: 5.2 10*3/uL (ref 1.7–7.7)
Neutrophils Relative %: 46 %
Platelets: 242 10*3/uL (ref 150–400)
RBC: 2.06 MIL/uL — ABNORMAL LOW (ref 3.87–5.11)
RDW: 36.2 % — AB (ref 11.5–15.5)
WBC: 11 10*3/uL — ABNORMAL HIGH (ref 4.0–10.5)

## 2017-05-30 LAB — COMPREHENSIVE METABOLIC PANEL
ALT: 18 U/L (ref 14–54)
AST: 42 U/L — AB (ref 15–41)
Albumin: 3.8 g/dL (ref 3.5–5.0)
Alkaline Phosphatase: 113 U/L (ref 38–126)
Anion gap: 4 — ABNORMAL LOW (ref 5–15)
BUN: 25 mg/dL — AB (ref 6–20)
CALCIUM: 9.4 mg/dL (ref 8.9–10.3)
CO2: 20 mmol/L — ABNORMAL LOW (ref 22–32)
CREATININE: 1.17 mg/dL — AB (ref 0.44–1.00)
Chloride: 117 mmol/L — ABNORMAL HIGH (ref 101–111)
GFR calc Af Amer: 53 mL/min — ABNORMAL LOW (ref >60)
GFR, EST NON AFRICAN AMERICAN: 46 mL/min — AB (ref >60)
GLUCOSE: 104 mg/dL — AB (ref 65–99)
POTASSIUM: 4 mmol/L (ref 3.5–5.1)
Sodium: 141 mmol/L (ref 135–145)
Total Bilirubin: 4.9 mg/dL — ABNORMAL HIGH (ref 0.3–1.2)
Total Protein: 6.3 g/dL — ABNORMAL LOW (ref 6.5–8.1)

## 2017-05-30 NOTE — Progress Notes (Signed)
Pt arrived for type and screen and lab draw per transcribed order; no complications noted

## 2017-05-30 NOTE — Progress Notes (Signed)
Patient ID: Chelsea Hensley, female   DOB: 08-25-1945, 72 y.o.   MRN: [id number]   Blood work drawn at [facility] today for transfusion on Monday.  Lab called critical value of 6.3 hemoglobin.  This was reported to Dr. Caroline More office by this RN.

## 2017-06-02 ENCOUNTER — Ambulatory Visit (HOSPITAL_COMMUNITY)
Admission: RE | Admit: 2017-06-02 | Discharge: 2017-06-02 | Disposition: A | Discharge status: Home / Self Care | Payer: MEDICARE | Source: Ambulatory Visit | Attending: Internal Medicine | Admitting: Internal Medicine

## 2017-06-02 DIAGNOSIS — D571 Sickle-cell disease without crisis: Secondary | ICD-10-CM | POA: Diagnosis not present

## 2017-06-02 LAB — PREPARE RBC (CROSSMATCH)

## 2017-06-02 MED ORDER — DIPHENHYDRAMINE HCL 50 MG/ML IJ SOLN: 25.0000 mg | Freq: Once | INTRAMUSCULAR | Status: AC

## 2017-06-02 MED ORDER — ACETAMINOPHEN 325 MG PO TABS
650.0000 mg | ORAL_TABLET | Freq: Once | ORAL | Status: AC
  Administered 2017-06-02: 650 mg via ORAL
  Filled 2017-06-02: qty 2

## 2017-06-02 MED ORDER — FUROSEMIDE 20 MG PO TABS
20.0000 mg | ORAL_TABLET | Freq: Once | ORAL | Status: AC
  Administered 2017-06-02: 20 mg via ORAL
  Filled 2017-06-02: qty 1

## 2017-06-02 MED ORDER — SODIUM CHLORIDE 0.9 % IV SOLN: Freq: Once | INTRAVENOUS | Status: DC

## 2017-06-02 MED ORDER — DIPHENHYDRAMINE HCL 25 MG PO CAPS
50.0000 mg | ORAL_CAPSULE | Freq: Once | ORAL | Status: AC
  Administered 2017-06-02: 50 mg via ORAL
  Filled 2017-06-02: qty 2

## 2017-06-02 NOTE — Discharge Instructions (Signed)

## 2017-06-02 NOTE — Progress Notes (Signed)
Diagnosis: Sickle Cell Anemia  Provider: Leretha Pol, MD   Procedure: Patient received 2 units of PRBCs via piv. Pre-medicated with tylenol and benadryl.  Lasix po between units.  Patienttolerated well.  Post procedure: Patient alert, oriented and ambulatory at discharge.

## 2017-06-03 LAB — TYPE AND SCREEN
ABO/RH(D): B POS
Antibody Screen: NEGATIVE
UNIT DIVISION: 0
UNIT DIVISION: 0

## 2017-06-03 LAB — BPAM RBC
BLOOD PRODUCT EXPIRATION DATE: 201902012359
Blood Product Expiration Date: 201902012359
ISSUE DATE / TIME: 201901140914
ISSUE DATE / TIME: 201901140914
UNIT TYPE AND RH: 5100
UNIT TYPE AND RH: 5100

## 2017-06-04 ENCOUNTER — Telehealth: Payer: Self-pay | Admitting: Internal Medicine

## 2017-06-04 DIAGNOSIS — R0609 Other forms of dyspnea: Principal | ICD-10-CM

## 2017-06-04 NOTE — Telephone Encounter (Signed)
Pt is requesting that we place an order to [facility] to cx her O2 supplies.  States that she has now bought a portable unit and does not need [facility]'s O2/supplies.  MW ok to d/c O2 at pt request?  Thanks.

## 2017-06-04 NOTE — Telephone Encounter (Signed)
Fine with me

## 2017-06-04 NOTE — Telephone Encounter (Signed)
Spoke with patient. She is aware of MW's recs. Will send order to [facility] for her.   Nothing else needed at time of call.

## 2017-07-23 ENCOUNTER — Encounter (HOSPITAL_COMMUNITY): Payer: BLUE CROSS/BLUE SHIELD

## 2017-07-30 ENCOUNTER — Ambulatory Visit (HOSPITAL_COMMUNITY)
Admission: RE | Admit: 2017-07-30 | Discharge: 2017-07-30 | Disposition: A | Discharge status: Home / Self Care | Payer: MEDICARE | Source: Ambulatory Visit | Attending: Internal Medicine | Admitting: Internal Medicine

## 2017-07-30 DIAGNOSIS — D57819 Other sickle-cell disorders with crisis, unspecified: Secondary | ICD-10-CM | POA: Insufficient documentation

## 2017-07-30 NOTE — Discharge Instructions (Signed)
Blood draw for type and screen.

## 2017-07-30 NOTE — Progress Notes (Signed)
                        Patient Care Center Note   Diagnosis: Sickle Cell Anemia  Provider: Leretha Pol, MD  Procedure: Patient arrived for a type and screen. Tolerated well.   Note: Patient will return on Friday, March 15 for blood transfusion.  Patient alert, oriented and ambulatory at time of discharge.    Otho Bellows, RN

## 2017-08-01 ENCOUNTER — Ambulatory Visit (HOSPITAL_COMMUNITY)
Admission: RE | Admit: 2017-08-01 | Discharge: 2017-08-01 | Disposition: A | Discharge status: Home / Self Care | Payer: MEDICARE | Source: Ambulatory Visit | Attending: Internal Medicine | Admitting: Internal Medicine

## 2017-08-01 DIAGNOSIS — D57819 Other sickle-cell disorders with crisis, unspecified: Secondary | ICD-10-CM | POA: Diagnosis not present

## 2017-08-01 LAB — PREPARE RBC (CROSSMATCH)

## 2017-08-01 MED ORDER — ACETAMINOPHEN 325 MG PO TABS
650.0000 mg | ORAL_TABLET | Freq: Once | ORAL | Status: AC
  Administered 2017-08-01: 650 mg via ORAL
  Filled 2017-08-01: qty 2

## 2017-08-01 MED ORDER — SODIUM CHLORIDE 0.9 % IV SOLN: Freq: Once | INTRAVENOUS | Status: DC

## 2017-08-01 MED ORDER — FUROSEMIDE 20 MG PO TABS
20.0000 mg | ORAL_TABLET | Freq: Once | ORAL | Status: AC
  Administered 2017-08-01: 20 mg via ORAL
  Filled 2017-08-01: qty 1

## 2017-08-01 MED ORDER — DIPHENHYDRAMINE HCL 25 MG PO CAPS
50.0000 mg | ORAL_CAPSULE | Freq: Once | ORAL | Status: AC
  Administered 2017-08-01: 50 mg via ORAL
  Filled 2017-08-01: qty 2

## 2017-08-01 NOTE — Progress Notes (Signed)
Patient Care Center Note   Diagnosis: Sickle Cell Anemia  Provider: Leretha Pol, MD  Procedure: Patient arrived for infusion of 2 units PRBC. Tolerated infusion well.    Note: Patient alert, oriented and ambulatory at time of discharge.  Coolidge Breeze, RN 08/01/2017

## 2017-08-01 NOTE — Discharge Instructions (Signed)

## 2017-08-02 LAB — BPAM RBC
BLOOD PRODUCT EXPIRATION DATE: 201904012359
Blood Product Expiration Date: 201904032359
ISSUE DATE / TIME: 201903150836
ISSUE DATE / TIME: 201903150836
UNIT TYPE AND RH: 5100
Unit Type and Rh: 5100

## 2017-08-02 LAB — TYPE AND SCREEN
ABO/RH(D): B POS
Antibody Screen: NEGATIVE
UNIT DIVISION: 0
UNIT DIVISION: 0

## 2017-09-15 ENCOUNTER — Ambulatory Visit (HOSPITAL_COMMUNITY)
Admission: RE | Admit: 2017-09-15 | Discharge: 2017-09-15 | Disposition: A | Discharge status: Home / Self Care | Payer: MEDICARE | Source: Ambulatory Visit | Attending: Internal Medicine | Admitting: Internal Medicine

## 2017-09-15 DIAGNOSIS — D57819 Other sickle-cell disorders with crisis, unspecified: Secondary | ICD-10-CM | POA: Insufficient documentation

## 2017-09-15 NOTE — Progress Notes (Signed)
Patient had a blood draw for type and cross for RBC transfusion tomorrow 09/16/2017. Patient educated on blood transfusion and told to keep the Blood tag on her wrist in place. Tolerated well and verbalized understanding.

## 2017-09-16 ENCOUNTER — Ambulatory Visit (HOSPITAL_COMMUNITY)
Admission: RE | Admit: 2017-09-16 | Discharge: 2017-09-16 | Disposition: A | Discharge status: Home / Self Care | Payer: MEDICARE | Source: Ambulatory Visit | Attending: Internal Medicine | Admitting: Internal Medicine

## 2017-09-16 DIAGNOSIS — D57819 Other sickle-cell disorders with crisis, unspecified: Secondary | ICD-10-CM | POA: Diagnosis not present

## 2017-09-16 LAB — PREPARE RBC (CROSSMATCH)

## 2017-09-16 MED ORDER — DIPHENHYDRAMINE HCL 25 MG PO CAPS
50.0000 mg | ORAL_CAPSULE | Freq: Once | ORAL | Status: AC
  Administered 2017-09-16: 50 mg via ORAL
  Filled 2017-09-16: qty 2

## 2017-09-16 MED ORDER — ACETAMINOPHEN 325 MG PO TABS
650.0000 mg | ORAL_TABLET | Freq: Once | ORAL | Status: AC
  Administered 2017-09-16: 650 mg via ORAL
  Filled 2017-09-16: qty 2

## 2017-09-16 MED ORDER — SODIUM CHLORIDE 0.9 % IV SOLN
Freq: Once | INTRAVENOUS | Status: AC
  Administered 2017-09-16: 09:00:00 via INTRAVENOUS

## 2017-09-16 NOTE — Discharge Instructions (Signed)

## 2017-09-16 NOTE — Progress Notes (Signed)
PATIENT CARE CENTER NOTE  Diagnosis: Iron Deficiency Anemia    Provider: Dr. Maia Petties   Procedure: 2 units PRBC's   Note: Patient received transfusion of 2 units of blood. Patient tolerated transfusion well with no adverse reaction. Vital signs remained stable. Discharge instructions given to patient. Patient alert, oriented and ambulatory at discharge.

## 2017-09-17 ENCOUNTER — Telehealth (HOSPITAL_COMMUNITY): Payer: Self-pay | Admitting: Internal Medicine

## 2017-09-17 LAB — BPAM RBC
Blood Product Expiration Date: 201905222359
Blood Product Expiration Date: 201905232359
ISSUE DATE / TIME: 201904300854
ISSUE DATE / TIME: 201904300854
UNIT TYPE AND RH: 5100
Unit Type and Rh: 5100

## 2017-09-17 LAB — TYPE AND SCREEN
ABO/RH(D): B POS
ANTIBODY SCREEN: NEGATIVE
Unit division: 0
Unit division: 0

## 2017-09-17 NOTE — Telephone Encounter (Signed)
Received a call from Dr. Caroline More office requesting a copy of pt labs to be faxed to office; pt did not have labs drawn in patient care center; 09/16/17, pt had type and screen completed and received 2 units PRBCs per order; staff member notified of same

## 2017-10-08 ENCOUNTER — Other Ambulatory Visit: Payer: Self-pay

## 2017-10-08 ENCOUNTER — Emergency Department (HOSPITAL_COMMUNITY): Payer: MEDICARE

## 2017-10-08 ENCOUNTER — Encounter (HOSPITAL_COMMUNITY): Payer: Self-pay | Admitting: Emergency Medicine

## 2017-10-08 ENCOUNTER — Emergency Department (HOSPITAL_COMMUNITY)
Admission: EM | Admit: 2017-10-08 | Discharge: 2017-10-08 | Disposition: A | Discharge status: Home / Self Care | Payer: MEDICARE | Attending: Emergency Medicine | Admitting: Emergency Medicine

## 2017-10-08 DIAGNOSIS — Z79899 Other long term (current) drug therapy: Secondary | ICD-10-CM | POA: Diagnosis not present

## 2017-10-08 DIAGNOSIS — Z87891 Personal history of nicotine dependence: Secondary | ICD-10-CM | POA: Insufficient documentation

## 2017-10-08 DIAGNOSIS — D5700 Hb-SS disease with crisis, unspecified: Secondary | ICD-10-CM | POA: Diagnosis not present

## 2017-10-08 DIAGNOSIS — L03114 Cellulitis of left upper limb: Secondary | ICD-10-CM

## 2017-10-08 DIAGNOSIS — R000 Tachycardia, unspecified: Secondary | ICD-10-CM | POA: Diagnosis not present

## 2017-10-08 DIAGNOSIS — R2232 Localized swelling, mass and lump, left upper limb: Secondary | ICD-10-CM | POA: Diagnosis present

## 2017-10-08 LAB — CBC WITH DIFFERENTIAL/PLATELET
BASOS ABS: 0 10*3/uL (ref 0.0–0.1)
BASOS PCT: 1 %
EOS PCT: 3 %
Eosinophils Absolute: 0.2 10*3/uL (ref 0.0–0.7)
HCT: 22.5 % — ABNORMAL LOW (ref 36.0–46.0)
Hemoglobin: 7.4 g/dL — ABNORMAL LOW (ref 12.0–15.0)
Lymphocytes Relative: 18 %
Lymphs Abs: 1.2 10*3/uL (ref 0.7–4.0)
MCH: 32 pg (ref 26.0–34.0)
MCHC: 32.9 g/dL (ref 30.0–36.0)
MCV: 97.4 fL (ref 78.0–100.0)
MONO ABS: 0.7 10*3/uL (ref 0.1–1.0)
Monocytes Relative: 11 %
NEUTROS PCT: 67 %
Neutro Abs: 4.4 10*3/uL (ref 1.7–7.7)
PLATELETS: 427 10*3/uL — AB (ref 150–400)
RBC: 2.31 MIL/uL — ABNORMAL LOW (ref 3.87–5.11)
RDW: 17.9 % — AB (ref 11.5–15.5)
WBC: 6.5 10*3/uL (ref 4.0–10.5)

## 2017-10-08 LAB — COMPREHENSIVE METABOLIC PANEL
ALBUMIN: 4.3 g/dL (ref 3.5–5.0)
ALK PHOS: 137 U/L — AB (ref 38–126)
ALT: 23 U/L (ref 14–54)
ANION GAP: 6 (ref 5–15)
AST: 27 U/L (ref 15–41)
BILIRUBIN TOTAL: 2.5 mg/dL — AB (ref 0.3–1.2)
BUN: 18 mg/dL (ref 6–20)
CALCIUM: 9.7 mg/dL (ref 8.9–10.3)
CO2: 21 mmol/L — ABNORMAL LOW (ref 22–32)
Chloride: 110 mmol/L (ref 101–111)
Creatinine, Ser: 1.22 mg/dL — ABNORMAL HIGH (ref 0.44–1.00)
GFR, EST AFRICAN AMERICAN: 50 mL/min — AB (ref >60)
GFR, EST NON AFRICAN AMERICAN: 43 mL/min — AB (ref >60)
GLUCOSE: 100 mg/dL — AB (ref 65–99)
POTASSIUM: 5 mmol/L (ref 3.5–5.1)
Sodium: 137 mmol/L (ref 135–145)
TOTAL PROTEIN: 6.8 g/dL (ref 6.5–8.1)

## 2017-10-08 LAB — RETICULOCYTES
RBC.: 2.31 MIL/uL — ABNORMAL LOW (ref 3.87–5.11)
RETIC COUNT ABSOLUTE: 251.8 10*3/uL — AB (ref 19.0–186.0)
Retic Ct Pct: 10.9 % — ABNORMAL HIGH (ref 0.4–3.1)

## 2017-10-08 MED ORDER — CEPHALEXIN 500 MG PO CAPS
500.0000 mg | ORAL_CAPSULE | Freq: Once | ORAL | Status: AC
  Administered 2017-10-08: 500 mg via ORAL
  Filled 2017-10-08: qty 1

## 2017-10-08 MED ORDER — HYDROMORPHONE HCL 1 MG/ML IJ SOLN: 1.0000 mg | INTRAMUSCULAR | Status: AC

## 2017-10-08 MED ORDER — HYDROMORPHONE HCL 1 MG/ML IJ SOLN
1.0000 mg | INTRAMUSCULAR | Status: AC
  Administered 2017-10-08: 1 mg via INTRAVENOUS
  Filled 2017-10-08: qty 1

## 2017-10-08 MED ORDER — CEPHALEXIN 500 MG PO CAPS: 500.0000 mg | ORAL_CAPSULE | Freq: Four times a day (QID) | ORAL | 0 refills | Status: DC

## 2017-10-08 MED ORDER — HYDROMORPHONE HCL 1 MG/ML IJ SOLN
1.0000 mg | INTRAMUSCULAR | Status: AC
  Administered 2017-10-08 (×2): 1 mg via INTRAVENOUS
  Filled 2017-10-08 (×2): qty 1

## 2017-10-08 MED ORDER — DEXTROSE-NACL 5-0.45 % IV SOLN
INTRAVENOUS | Status: DC
  Administered 2017-10-08: 13:00:00 via INTRAVENOUS

## 2017-10-08 MED ORDER — HYDROMORPHONE HCL 1 MG/ML IJ SOLN
1.0000 mg | INTRAMUSCULAR | Status: AC
  Filled 2017-10-08: qty 1

## 2017-10-08 MED ORDER — HYDROMORPHONE HCL 1 MG/ML IJ SOLN: 0.5000 mg | INTRAMUSCULAR | Status: AC

## 2017-10-08 MED ORDER — HYDROMORPHONE HCL 1 MG/ML IJ SOLN
0.5000 mg | INTRAMUSCULAR | Status: AC
  Administered 2017-10-08: 0.5 mg via INTRAVENOUS
  Filled 2017-10-08: qty 1

## 2017-10-08 MED ORDER — ONDANSETRON HCL 4 MG/2ML IJ SOLN: 4.0000 mg | INTRAMUSCULAR | Status: DC | PRN

## 2017-10-08 NOTE — ED Provider Notes (Signed)
[facility] DEPT Provider Note   CSN: [id number] Arrival date & time: 10/08/17  1158     History   Chief Complaint Chief Complaint  Patient presents with  . Sickle Cell Pain Crisis  . Arm Swelling    HPI Chelsea Hensley is a 72 y.o. female.  HPI   Patient is a very pleasant 72 year old female with history of sickle cell disease.  Patient reports that she had a little bit of swelling in her left hand.  She reports is red and warm.  She reports after that she started developing some sickle cell pain.  She reports that she can usually manage at home but has felt very uncomfortable.  She reports is her typical pain in her back.  No concerning symptoms such as chest pain, fevers.  Past Medical History:  Diagnosis Date  . Gout   . Hyperuricemia   . Sickle cell anemia (HCC)   . 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 Dyspnea on exertion 12/24/2016  . Anemia 12/02/2015  . Sickle cell anemia ([place]) 12/02/2015    Past Surgical History:  Procedure Laterality Date  . ABDOMINAL HYSTERECTOMY    . CATARACT EXTRACTION, BILATERAL    . CHOLECYSTECTOMY    . HERNIA REPAIR    . TOTAL ABDOMINAL HYSTERECTOMY W/ BILATERAL SALPINGOOPHORECTOMY Bilateral      OB History   None      Home Medications    Prior to Admission medications   Medication Sig Start Date End Date Taking? Authorizing Provider  folic acid (FOLVITE) 1 MG tablet Take 1 tablet (1 mg total) by mouth daily. 01/24/16  Yes Micheline Chapman, NP  furosemide (LASIX) 20 MG tablet Take 40 mg by mouth daily as needed for fluid.    Yes [provider]  Multiple Vitamin (MULTIVITAMIN WITH MINERALS) TABS tablet Take 1 tablet by mouth daily.   Yes [provider]  oxyCODONE (OXYCONTIN) 80 mg 12 hr tablet Take 1 tablet (80 mg total) by mouth every 12 (twelve) hours as needed (for severe  pain). 01/24/16  Yes Micheline Chapman, NP  oxyCODONE-acetaminophen (PERCOCET/ROXICET) 5-325 MG tablet Take 1-2 tablets by mouth every 4 (four) hours as needed for moderate pain or severe pain. 01/24/16  Yes Micheline Chapman, NP    Family History No family history on file.  Social History Social History   Tobacco Use  . Smoking status: Former Smoker    Packs/day: 1.00    Types: Cigarettes    Last attempt to quit: 12/25/2006    Years since quitting: 10.7  . Smokeless tobacco: Never Used  . Tobacco comment: pt states that she was in her 77s when she started smoking; cannot remember exactly  Substance Use Topics  . Alcohol use: Yes    Comment: rare  . Drug use: No     Allergies   Quinolones   Review of Systems Review of Systems  Constitutional: Negative for activity change and fatigue.  HENT: Negative for congestion and drooling.   Eyes: Negative for discharge.  Respiratory: Negative for cough and chest tightness.   Cardiovascular: Negative for chest pain.  Gastrointestinal: Negative for abdominal distention.  Genitourinary: Negative for difficulty urinating and dysuria.  Musculoskeletal: Negative for joint swelling.  Skin: Negative for rash.  Allergic/Immunologic: Negative for immunocompromised state.  Neurological: Negative for seizures and speech difficulty.  Psychiatric/Behavioral: Negative for  agitation and behavioral problems.  All other systems reviewed and are negative.    Physical Exam Updated Vital Signs BP (!) 135/57   Pulse 99   Temp 98.8 F (37.1 C) (Oral)   Resp 14   SpO2 97%   Physical Exam  Constitutional: She is oriented to person, place, and time. She appears well-developed and well-nourished.  HENT:  Head: Normocephalic and atraumatic.  Eyes: Right eye exhibits no discharge. Left eye exhibits no discharge.  Cardiovascular: Regular rhythm.  No murmur heard. Tachycardia.  Pulmonary/Chest: Effort normal and breath sounds normal. No respiratory  distress.  Musculoskeletal:  Mild erythema swelling and mild tenderness to the dorsal surface of left hand.  Pulses intact.  No signs of infection.  Range of motion of the wrist elicits no pain.  No swelling proximally.  Neurological: She is oriented to person, place, and time.  Skin: Skin is warm and dry. She is not diaphoretic.  Psychiatric: She has a normal mood and affect.  Nursing note and vitals reviewed.    ED Treatments / Results  Labs (all labs ordered are listed, but only abnormal results are displayed) Labs Reviewed  CBC WITH DIFFERENTIAL/PLATELET - Abnormal; Notable for the following components:      Result Value   RBC 2.31 (*)    Hemoglobin 7.4 (*)    HCT 22.5 (*)    RDW 17.9 (*)    Platelets 427 (*)    All other components within normal limits  RETICULOCYTES - Abnormal; Notable for the following components:   Retic Ct Pct 10.9 (*)    RBC. 2.31 (*)    Retic Count, Absolute 251.8 (*)    All other components within normal limits  COMPREHENSIVE METABOLIC PANEL    EKG None  Radiology Dg Chest 2 View  Result Date: 10/08/2017 CLINICAL DATA:  Left arm pain, swelling, shortness of breath EXAM: CHEST - 2 VIEW COMPARISON:  09/05/2016 FINDINGS: Cardiomegaly, vascular congestion. Linear atelectasis or scarring in the lung bases no effusions. No acute bony abnormality. IMPRESSION: Cardiomegaly with vascular congestion. Bibasilar scarring or atelectasis, stable. Electronically Signed   By: Rolm Baptise M.D.   On: 10/08/2017 12:45    Procedures Procedures (including critical care time)  Medications Ordered in ED Medications - No data to display   Initial Impression / Assessment and Plan / ED Course  I have reviewed the triage vital signs and the nursing notes.  Pertinent labs & imaging results that were available during my care of the patient were reviewed by me and considered in my medical decision making (see chart for details).    Patient is a very pleasant  72 year old female with history of sickle cell disease.  Patient reports that she had a little bit of swelling in her left hand.  She reports is red and warm.  She reports after that she started developing some sickle cell pain.  She reports that she can usually manage at home but has felt very uncomfortable.  She reports is her typical pain in her back.  No concerning symptoms such as chest pain, fevers.  1:09 PM She is sickle cell is usually well controlled as an outpatient.  Will treat for sickle cell pain.  Will start antibiotic for likely cellulitis.  The cellulitis is very subtle in nature, can likely just initiate with Keflex.  Final Clinical Impressions(s) / ED Diagnoses   Final diagnoses:  None    ED Discharge Orders    None  Macarthur Critchley, MD 10/08/17 1529

## 2017-10-08 NOTE — ED Notes (Signed)
ED Provider at bedside. 

## 2017-10-08 NOTE — ED Triage Notes (Signed)
Pt complaint of left arm swelling and back pain onset Saturday; consistent with [place] pain.

## 2017-10-08 NOTE — ED Provider Notes (Signed)
  Physical Exam  BP 135/64   Pulse 91   Temp 98.8 F (37.1 C) (Oral)   Resp 10   SpO2 100%   Physical Exam  ED Course/Procedures     Procedures  MDM  Received patient in signout.  Cellulitis of left hand and sickle cell pain crisis.  Feels better after treatment now.  Swelling only in the hand.  Doubt abscess.  Will treat with antibiotics and follow-up with PCP as needed.       Davonna Belling, MD 10/08/17 [phone]

## 2017-10-14 ENCOUNTER — Other Ambulatory Visit: Payer: Self-pay

## 2017-10-14 ENCOUNTER — Emergency Department (HOSPITAL_COMMUNITY): Payer: MEDICARE

## 2017-10-14 ENCOUNTER — Inpatient Hospital Stay (HOSPITAL_COMMUNITY)
Admission: EM | Admit: 2017-10-14 | Discharge: 2017-10-19 | DRG: 553 | Disposition: A | Discharge status: Home / Self Care | Payer: MEDICARE | Attending: Internal Medicine | Admitting: Internal Medicine

## 2017-10-14 ENCOUNTER — Encounter (HOSPITAL_COMMUNITY): Payer: Self-pay

## 2017-10-14 DIAGNOSIS — D571 Sickle-cell disease without crisis: Secondary | ICD-10-CM | POA: Diagnosis present

## 2017-10-14 DIAGNOSIS — N179 Acute kidney failure, unspecified: Secondary | ICD-10-CM | POA: Diagnosis present

## 2017-10-14 DIAGNOSIS — E875 Hyperkalemia: Secondary | ICD-10-CM | POA: Diagnosis present

## 2017-10-14 DIAGNOSIS — M79602 Pain in left arm: Secondary | ICD-10-CM | POA: Diagnosis not present

## 2017-10-14 DIAGNOSIS — Z87891 Personal history of nicotine dependence: Secondary | ICD-10-CM

## 2017-10-14 DIAGNOSIS — N183 Chronic kidney disease, stage 3 (moderate): Secondary | ICD-10-CM | POA: Diagnosis present

## 2017-10-14 DIAGNOSIS — M10032 Idiopathic gout, left wrist: Secondary | ICD-10-CM | POA: Diagnosis not present

## 2017-10-14 DIAGNOSIS — R0902 Hypoxemia: Secondary | ICD-10-CM | POA: Diagnosis present

## 2017-10-14 DIAGNOSIS — G894 Chronic pain syndrome: Secondary | ICD-10-CM | POA: Diagnosis present

## 2017-10-14 DIAGNOSIS — M25532 Pain in left wrist: Secondary | ICD-10-CM

## 2017-10-14 DIAGNOSIS — Z79899 Other long term (current) drug therapy: Secondary | ICD-10-CM

## 2017-10-14 DIAGNOSIS — M109 Gout, unspecified: Secondary | ICD-10-CM | POA: Diagnosis present

## 2017-10-14 DIAGNOSIS — D5700 Hb-SS disease with crisis, unspecified: Secondary | ICD-10-CM

## 2017-10-14 DIAGNOSIS — Z90710 Acquired absence of both cervix and uterus: Secondary | ICD-10-CM

## 2017-10-14 DIAGNOSIS — D649 Anemia, unspecified: Secondary | ICD-10-CM | POA: Diagnosis present

## 2017-10-14 LAB — RETICULOCYTES
RBC.: 1.91 MIL/uL — ABNORMAL LOW (ref 3.87–5.11)
Retic Count, Absolute: 424 10*3/uL — ABNORMAL HIGH (ref 19.0–186.0)
Retic Ct Pct: 22.2 % — ABNORMAL HIGH (ref 0.4–3.1)

## 2017-10-14 LAB — IRON AND TIBC
Iron: 31 ug/dL (ref 28–170)
SATURATION RATIOS: 18 % (ref 10.4–31.8)
TIBC: 175 ug/dL — ABNORMAL LOW (ref 250–450)
UIBC: 144 ug/dL

## 2017-10-14 LAB — COMPREHENSIVE METABOLIC PANEL
ALBUMIN: 3.9 g/dL (ref 3.5–5.0)
ALT: 14 U/L (ref 14–54)
AST: 17 U/L (ref 15–41)
Alkaline Phosphatase: 109 U/L (ref 38–126)
Anion gap: 9 (ref 5–15)
BILIRUBIN TOTAL: 1.6 mg/dL — AB (ref 0.3–1.2)
BUN: 23 mg/dL — AB (ref 6–20)
CHLORIDE: 110 mmol/L (ref 101–111)
CO2: 21 mmol/L — ABNORMAL LOW (ref 22–32)
CREATININE: 1.64 mg/dL — AB (ref 0.44–1.00)
Calcium: 9.2 mg/dL (ref 8.9–10.3)
GFR calc Af Amer: 35 mL/min — ABNORMAL LOW (ref >60)
GFR, EST NON AFRICAN AMERICAN: 30 mL/min — AB (ref >60)
GLUCOSE: 100 mg/dL — AB (ref 65–99)
POTASSIUM: 4.5 mmol/L (ref 3.5–5.1)
Sodium: 140 mmol/L (ref 135–145)
Total Protein: 6.5 g/dL (ref 6.5–8.1)

## 2017-10-14 LAB — VITAMIN B12: Vitamin B-12: 518 pg/mL (ref 180–914)

## 2017-10-14 LAB — CBC WITH DIFFERENTIAL/PLATELET
Basophils Absolute: 0.1 10*3/uL (ref 0.0–0.1)
Basophils Relative: 1 %
EOS PCT: 4 %
Eosinophils Absolute: 0.3 10*3/uL (ref 0.0–0.7)
HEMATOCRIT: 19.1 % — AB (ref 36.0–46.0)
HEMOGLOBIN: 6.1 g/dL — AB (ref 12.0–15.0)
LYMPHS ABS: 0.9 10*3/uL (ref 0.7–4.0)
Lymphocytes Relative: 13 %
MCH: 31.9 pg (ref 26.0–34.0)
MCHC: 31.9 g/dL (ref 30.0–36.0)
MCV: 100 fL (ref 78.0–100.0)
MONOS PCT: 11 %
Monocytes Absolute: 0.8 10*3/uL (ref 0.1–1.0)
NEUTROS ABS: 5.1 10*3/uL (ref 1.7–7.7)
NRBC: 8 /100{WBCs} — AB
Neutrophils Relative %: 71 %
Platelets: 433 10*3/uL — ABNORMAL HIGH (ref 150–400)
RBC: 1.91 MIL/uL — ABNORMAL LOW (ref 3.87–5.11)
RDW: 19.4 % — AB (ref 11.5–15.5)
WBC: 7.2 10*3/uL (ref 4.0–10.5)

## 2017-10-14 LAB — PREPARE RBC (CROSSMATCH)

## 2017-10-14 LAB — TROPONIN I: Troponin I: <0.03 ng/mL (ref <0.03)

## 2017-10-14 LAB — I-STAT CG4 LACTIC ACID, ED: Lactic Acid, Venous: 0.49 mmol/L — ABNORMAL LOW (ref 0.5–1.9)

## 2017-10-14 LAB — FERRITIN: Ferritin: 1083 ng/mL — ABNORMAL HIGH (ref 11–307)

## 2017-10-14 LAB — BRAIN NATRIURETIC PEPTIDE: B NATRIURETIC PEPTIDE 5: 612.6 pg/mL — AB (ref 0.0–100.0)

## 2017-10-14 MED ORDER — HYDROMORPHONE HCL 2 MG/ML IJ SOLN: 2.0000 mg | INTRAMUSCULAR | Status: AC

## 2017-10-14 MED ORDER — HYDROCODONE-ACETAMINOPHEN 5-325 MG PO TABS
1.0000 | ORAL_TABLET | ORAL | Status: DC | PRN
  Administered 2017-10-14 – 2017-10-15 (×2): 2 via ORAL
  Filled 2017-10-14 (×2): qty 2

## 2017-10-14 MED ORDER — HYDROMORPHONE HCL 2 MG/ML IJ SOLN
2.0000 mg | INTRAMUSCULAR | Status: AC
  Filled 2017-10-14 (×2): qty 1

## 2017-10-14 MED ORDER — PREDNISONE 50 MG PO TABS
50.0000 mg | ORAL_TABLET | Freq: Every day | ORAL | Status: DC
  Administered 2017-10-14 – 2017-10-19 (×5): 50 mg via ORAL
  Filled 2017-10-14 (×6): qty 1

## 2017-10-14 MED ORDER — HYDROMORPHONE HCL 2 MG/ML IJ SOLN
2.0000 mg | INTRAMUSCULAR | Status: AC
  Administered 2017-10-14: 2 mg via INTRAVENOUS
  Filled 2017-10-14: qty 1

## 2017-10-14 MED ORDER — ONDANSETRON HCL 4 MG/2ML IJ SOLN: 4.0000 mg | Freq: Four times a day (QID) | INTRAMUSCULAR | Status: DC | PRN

## 2017-10-14 MED ORDER — HYDROMORPHONE HCL 2 MG/ML IJ SOLN
2.0000 mg | INTRAMUSCULAR | Status: AC
  Administered 2017-10-14 (×2): 2 mg via SUBCUTANEOUS
  Filled 2017-10-14: qty 1

## 2017-10-14 MED ORDER — FOLIC ACID 1 MG PO TABS
1.0000 mg | ORAL_TABLET | Freq: Every day | ORAL | Status: DC
  Administered 2017-10-15 – 2017-10-19 (×5): 1 mg via ORAL
  Filled 2017-10-14 (×5): qty 1

## 2017-10-14 MED ORDER — SODIUM CHLORIDE 0.9 % IV SOLN
Freq: Once | INTRAVENOUS | Status: AC
  Administered 2017-10-14: 21:00:00 via INTRAVENOUS

## 2017-10-14 MED ORDER — ADULT MULTIVITAMIN W/MINERALS CH
1.0000 | ORAL_TABLET | Freq: Every day | ORAL | Status: DC
  Administered 2017-10-15 – 2017-10-19 (×5): 1 via ORAL
  Filled 2017-10-14 (×5): qty 1

## 2017-10-14 MED ORDER — ONDANSETRON HCL 4 MG PO TABS: 4.0000 mg | ORAL_TABLET | Freq: Four times a day (QID) | ORAL | Status: DC | PRN

## 2017-10-14 MED ORDER — ONDANSETRON HCL 4 MG/2ML IJ SOLN
4.0000 mg | INTRAMUSCULAR | Status: DC | PRN
  Filled 2017-10-14: qty 2

## 2017-10-14 NOTE — ED Notes (Signed)
Pt alert x4 c/o scc pain to lf arm x 2 weeks, pt has been PCP with no relief, prn meds given at this time.I will continue to monitor.

## 2017-10-14 NOTE — ED Triage Notes (Signed)
Patient c/o sickle cell pain  Of the left arm x 11 days. Left arm swelling noted. Patient states she has prn O2 at home and has had to use O2 at home for the past 2 days.

## 2017-10-14 NOTE — ED Provider Notes (Addendum)
[facility] DEPT Provider Note   CSN: [id number] Arrival date & time: 10/14/17  1524     History   Chief Complaint Chief Complaint  Patient presents with  . Sickle Cell Pain Crisis    HPI Chelsea Hensley is a 72 y.o. female.  HPI  72 year old female presents with sickle cell pain crisis and left hand swelling.  She states that she is been having left hand swelling that is progressive for the past week and a half.  Was seen here about 6 days ago and placed on Keflex.  She was given IV Dilaudid at that time with partial relief.  She states that her symptoms are worsening.  She is having back pain that is typical of a pain crisis.  She is also been feeling short of breath and requiring her PRN oxygen, which is also typical of her crisis.  No fevers.  She has been having a cough with occasional white sputum.  The pain in her hand feels like prior pain crises in joints.  The hand and wrist have been warm.  The swelling seems to be progressive up her forearm.  Past Medical History:  Diagnosis Date  . Gout   . Hyperuricemia   . Sickle cell anemia (HCC)   . 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 Dyspnea on exertion 12/24/2016  . Anemia 12/02/2015  . Sickle cell anemia ([place]) 12/02/2015    Past Surgical History:  Procedure Laterality Date  . ABDOMINAL HYSTERECTOMY    . CATARACT EXTRACTION, BILATERAL    . CHOLECYSTECTOMY    . HERNIA REPAIR    . TOTAL ABDOMINAL HYSTERECTOMY W/ BILATERAL SALPINGOOPHORECTOMY Bilateral      OB History   None      Home Medications    Prior to Admission medications   Medication Sig Start Date End Date Taking? Authorizing Provider  cephALEXin (KEFLEX) 500 MG capsule Take 1 capsule (500 mg total) by mouth 4 (four) times daily. 10/08/17  Yes Davonna Belling, MD  folic acid (FOLVITE) 1 MG tablet Take 1 tablet (1 mg total)  by mouth daily. 01/24/16  Yes Micheline Chapman, NP  Multiple Vitamin (MULTIVITAMIN WITH MINERALS) TABS tablet Take 1 tablet by mouth daily.   Yes [provider]  oxyCODONE (OXY IR/ROXICODONE) 5 MG immediate release tablet Take 5 mg by mouth every 4 (four) hours as needed for severe pain.   Yes [provider]  oxyCODONE (OXYCONTIN) 80 mg 12 hr tablet Take 1 tablet (80 mg total) by mouth every 12 (twelve) hours as needed (for severe pain). 01/24/16  Yes Micheline Chapman, NP  furosemide (LASIX) 20 MG tablet Take 40 mg by mouth daily as needed for fluid.     [provider]  oxyCODONE-acetaminophen (PERCOCET/ROXICET) 5-325 MG tablet Take 1-2 tablets by mouth every 4 (four) hours as needed for moderate pain or severe pain. Patient not taking: Reported on 10/14/2017 01/24/16   Micheline Chapman, NP    Family History History reviewed. No pertinent family history.  Social History Social History   Tobacco Use  . Smoking status: Former Smoker    Packs/day: 1.00    Types: Cigarettes    Last attempt to quit: 12/25/2006    Years since quitting: 10.8  . Smokeless tobacco: Never Used  . Tobacco comment: pt states that she was in her 44s when she started  smoking; cannot remember exactly  Substance Use Topics  . Alcohol use: Yes    Comment: rare  . Drug use: No     Allergies   Quinolones   Review of Systems Review of Systems  Constitutional: Negative for fever.  Respiratory: Positive for cough, shortness of breath and wheezing.   Cardiovascular: Positive for leg swelling. Negative for chest pain.  Gastrointestinal: Negative for abdominal pain.  Musculoskeletal: Positive for arthralgias, back pain and joint swelling.  All other systems reviewed and are negative.    Physical Exam Updated Vital Signs BP 123/68   Pulse 96   Temp 98.6 F (37 C) (Oral)   Resp 13   Ht $R'5\' 4"'LK$  (1.626 m)   Wt 64.4 kg (142 lb)   SpO2 97%   BMI 24.37 kg/m   Physical Exam    Constitutional: She is oriented to person, place, and time. She appears well-developed and well-nourished. No distress.  HENT:  Head: Normocephalic and atraumatic.  Right Ear: External ear normal.  Left Ear: External ear normal.  Nose: Nose normal.  Eyes: Right eye exhibits no discharge. Left eye exhibits no discharge.  Cardiovascular: Regular rhythm and normal heart sounds. Tachycardia present.  Pulses:      Radial pulses are 2+ on the left side.  Pulmonary/Chest: Effort normal. She has rales (mild, bibasilar).  Abdominal: Soft. There is no tenderness.  Musculoskeletal: She exhibits edema (trace, bilateral feet).       Left wrist: She exhibits decreased range of motion, tenderness and swelling.       Left forearm: She exhibits tenderness and swelling (mild, distal).       Left hand: She exhibits decreased range of motion, tenderness and swelling.  Neurological: She is alert and oriented to person, place, and time.  Skin: Skin is warm and dry. She is not diaphoretic.  Nursing note and vitals reviewed.    ED Treatments / Results  Labs (all labs ordered are listed, but only abnormal results are displayed) Labs Reviewed  COMPREHENSIVE METABOLIC PANEL - Abnormal; Notable for the following components:      Result Value   CO2 21 (*)    Glucose, Bld 100 (*)    BUN 23 (*)    Creatinine, Ser 1.64 (*)    Total Bilirubin 1.6 (*)    GFR calc non Af Amer 30 (*)    GFR calc Af Amer 35 (*)    All other components within normal limits  CBC WITH DIFFERENTIAL/PLATELET - Abnormal; Notable for the following components:   RBC 1.91 (*)    Hemoglobin 6.1 (*)    HCT 19.1 (*)    RDW 19.4 (*)    Platelets 433 (*)    nRBC 8 (*)    All other components within normal limits  RETICULOCYTES - Abnormal; Notable for the following components:   Retic Ct Pct 22.2 (*)    RBC. 1.91 (*)    Retic Count, Absolute 424.0 (*)    All other components within normal limits  I-STAT CG4 LACTIC ACID, ED -  Abnormal; Notable for the following components:   Lactic Acid, Venous 0.49 (*)    All other components within normal limits    EKG EKG Interpretation  Date/Time:  Tuesday Oct 14 2017 15:50:00 EDT Ventricular Rate:  113 PR Interval:    QRS Duration: 84 QT Interval:  329 QTC Calculation: 452 R Axis:   53 Text Interpretation:  Sinus tachycardia no acute ST/T changes rate is faster compared to  2017 Confirmed by Sherwood Gambler [phone]) on 10/14/2017 4:26:46 PM   Radiology Dg Chest 2 View  Result Date: 10/14/2017 CLINICAL DATA:  Initial evaluation for acute cough, shortness of breath. Sickle cell crisis. EXAM: CHEST - 2 VIEW COMPARISON:  Prior radiograph from 10/08/2017. FINDINGS: Moderate cardiomegaly, stable from previous. Mediastinal silhouette normal. Aortic atherosclerosis. Lungs mildly hypoinflated. Perihilar vascular congestion without overt pulmonary edema. Patchy and linear bibasilar opacities, right greater than left, similar to previous exams, likely atelectasis and/or chronic scarring. No other focal infiltrates. No pleural effusion. No pneumothorax. No acute osseous abnormality. IMPRESSION: 1. Cardiomegaly with perihilar vascular congestion without overt pulmonary edema. 2. Patchy and linear bibasilar opacities, right greater than left, similar to previous exams, and likely reflecting atelectasis and/or chronic scarring. 3. Aortic atherosclerosis. Electronically Signed   By: Jeannine Boga M.D.   On: 10/14/2017 18:16   Dg Wrist Complete Left  Result Date: 10/14/2017 CLINICAL DATA:  Initial evaluation for acute swelling, no trauma. EXAM: LEFT WRIST - COMPLETE 3+ VIEW COMPARISON:  None. FINDINGS: Fracture involving the waist of the scaphoid with 5 mm of distraction, favored to be chronic. No other acute fracture dislocation. Bones are diffusely osteopenic. Scattered osteoarthritic changes noted about the proximal left hand. Diffuse soft tissue swelling. IMPRESSION: 1. Fracture  involving the scaphoid waist with 5 mm of distraction, favored to be chronic in nature. Correlation with physical exam recommended. 2. No other acute osseous abnormality about the wrist. 3. Diffuse osteopenia. Electronically Signed   By: Jeannine Boga M.D.   On: 10/14/2017 18:22   Dg Hand Complete Left  Result Date: 10/14/2017 CLINICAL DATA:  Initial evaluation for acute swelling, no trauma. EXAM: LEFT HAND - COMPLETE 3+ VIEW COMPARISON:  None. FINDINGS: Fracture extending through the waist of the scaphoid noted with 5 mm of distraction, favored to be chronic in nature. Bones are diffusely osteopenia no other acute fracture or dislocation. Mild scattered osteoarthritic changes present about the hand, most notable at the first [facility] joint. Mild diffuse soft tissue swelling. IMPRESSION: 1. Fracture involving the scaphoid waist with 5 mm of distraction, favored to be chronic in nature. Correlation with physical exam recommended. 2. No other acute osseous abnormality about the left hand. 3. Diffuse osteopenia. Electronically Signed   By: Jeannine Boga M.D.   On: 10/14/2017 18:20    Procedures Procedures (including critical care time)  Medications Ordered in ED Medications  HYDROmorphone (DILAUDID) injection 2 mg (has no administration in time range)    Or  HYDROmorphone (DILAUDID) injection 2 mg (has no administration in time range)  ondansetron (ZOFRAN) injection 4 mg (has no administration in time range)  HYDROmorphone (DILAUDID) injection 2 mg (2 mg Intravenous Given 10/14/17 1720)    Or  HYDROmorphone (DILAUDID) injection 2 mg ( Subcutaneous See Alternative 10/14/17 1720)  HYDROmorphone (DILAUDID) injection 2 mg (2 mg Intravenous Given 10/14/17 1758)    Or  HYDROmorphone (DILAUDID) injection 2 mg ( Subcutaneous See Alternative 10/14/17 1758)  HYDROmorphone (DILAUDID) injection 2 mg (2 mg Intravenous Given 10/14/17 1938)    Or  HYDROmorphone (DILAUDID) injection 2 mg ( Subcutaneous See  Alternative 10/14/17 1938)     Initial Impression / Assessment and Plan / ED Course  I have reviewed the triage vital signs and the nursing notes.  Pertinent labs & imaging results that were available during my care of the patient were reviewed by me and considered in my medical decision making (see chart for details).     Patient's pain is partially  controlled with IV Dilaudid.  Her hemoglobin has dropped, normal is around 7 and today is 6.1.  She is afebrile and with a normal white blood cell count and normal lactic acid.  She was treated for cellulitis in the left hand/wrist before but this is probably more inflammatory.  I doubt this is a septic joint or an infection given how long it has been ongoing without any other findings such as fever, elevated WBC, etc.  However given her sickle cell process she will probably need an MRI, especially with possible old fracture seen on x-ray.  I doubt DVT as the swelling started in the hand and has progressed proximally. No upper arm pain/swelling. She will need admission for pain control and other supportive care.  Hospitalist to admit.  Final Clinical Impressions(s) / ED Diagnoses   Final diagnoses:  Sickle cell pain crisis [facility])    ED Discharge Orders    None       Sherwood Gambler, MD 10/14/17 2008    Sherwood Gambler, MD 10/14/17 2027

## 2017-10-14 NOTE — H&P (Addendum)
History and Physical    Chelsea Hensley IWL:[id number] DOB: November 12, 1945 DOA: 10/14/2017  PCP: Audley Hose, MD   Patient coming from: Home   Chief Complaint: Left arm pain, SOB  HPI: Chelsea Hensley is a 72 y.o. female with medical history significant for sickle cell disease, Gout, who presented to the ED with complaints of left upper arm pain and swelling of ~10 days duration.   Pain is mostly in her left wrist.  Patient presented to the ED 10/08/17, CBC, CMP was done-unremarkable, patient was given a course of Keflex for cellulitis and discharged home.  Patient endorses compliance 4 times daily of Keflex, but symptoms worsened. She denies fever or chills , nausea vomiting. Patient denies falls, no personal or family history hx of blood clots, no procedures to extremity.  Patient reports a history of gout but normally affects her lower extremity- toes.  She has not had a gout flare in several years.  Patient sickle cell pain normally is in her back chest extremities, which today is not worse.  Patient also reported chronic shortness of breath with exertion.  But she is not exactly able to clarify to me if this has worsened recently, but she reported this to EDP.  Patient has just received IV Dilaudid, and is drowsy. She states she was prescribed home O2, for SOB.  Reports in the past she usually needs home O2- 2L PRN only when her blood counts are low.  She endorses very occasional NSAID use.  Denies dark stools or abdominal pain.  ED Course: O2 sats 88% on room air improved to >96% on [place].  Hemoglobin- 6.1, last check 10 days ago- 7.4, with good absolute reticulocyte counts. WBC- 7.2.  Cr elevated 1.64.  EKG no ST or T wave abnormalities, tachycardia.  Chest x-ray-cardiomegaly with perihilar vascular congestion without overt pulmonary edema, right greater than left bibasilar opacities-atelectasis and or chronic scarring.   Review of Systems: As per HPI otherwise 10 point review of systems negative.    Past Medical History:  Diagnosis Date  . Gout   . Hyperuricemia   . Sickle cell anemia (HCC)   . 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 ABDOMINAL HYSTERECTOMY    . CATARACT EXTRACTION, BILATERAL    . CHOLECYSTECTOMY    . HERNIA REPAIR    . TOTAL ABDOMINAL HYSTERECTOMY W/ BILATERAL SALPINGOOPHORECTOMY Bilateral      reports that she quit smoking about 10 years ago. Her smoking use included cigarettes. She smoked 1.00 pack per day. She has never used smokeless tobacco. She reports that she drinks alcohol. She reports that she does not use drugs.  Allergies  Allergen Reactions  . Quinolones Anaphylaxis    All vitals and pressures dropped, blacked out and had seizures   No family history of thromboembolic disease.  Prior to Admission medications   Medication Sig Start Date End Date Taking? Authorizing Provider  cephALEXin (KEFLEX) 500 MG capsule Take 1 capsule (500 mg total) by mouth 4 (four) times daily. 10/08/17  Yes Davonna Belling, MD  folic acid (FOLVITE) 1 MG tablet Take 1 tablet (1 mg total) by mouth daily. 01/24/16  Yes Micheline Chapman, NP  Multiple Vitamin (MULTIVITAMIN WITH MINERALS) TABS tablet Take 1 tablet by mouth daily.   Yes [provider]  oxyCODONE (OXY IR/ROXICODONE) 5 MG immediate release tablet Take 5 mg by mouth every 4 (four) hours as  needed for severe pain.   Yes [provider]  oxyCODONE (OXYCONTIN) 80 mg 12 hr tablet Take 1 tablet (80 mg total) by mouth every 12 (twelve) hours as needed (for severe pain). 01/24/16  Yes Henrietta Hoover, NP  furosemide (LASIX) 20 MG tablet Take 40 mg by mouth daily as needed for fluid.     [provider]  oxyCODONE-acetaminophen (PERCOCET/ROXICET) 5-325 MG tablet Take 1-2 tablets by mouth every 4 (four) hours as needed for moderate pain or severe pain. Patient not taking: Reported on 10/14/2017  01/24/16   Henrietta Hoover, NP    Physical Exam: Vitals:   10/14/17 1830 10/14/17 1900 10/14/17 1930 10/14/17 2000  BP: 123/82 130/77 123/68 122/69  Pulse:   96 94  Resp: 18 (!) 9 13 (!) 9  Temp:      TempSrc:      SpO2:   97% 96%  Weight:      Height:        Constitutional: Drowsy, s/p IV dilaudid in ED, but easily arousable Vitals:   10/14/17 1830 10/14/17 1900 10/14/17 1930 10/14/17 2000  BP: 123/82 130/77 123/68 122/69  Pulse:   96 94  Resp: 18 (!) 9 13 (!) 9  Temp:      TempSrc:      SpO2:   97% 96%  Weight:      Height:       Eyes: PERRL, lids and conjunctivae normal ENMT: Mucous membranes are moist. Posterior pharynx clear of any exudate or lesions..  Neck: normal, supple, no masses, no thyromegaly Respiratory: clear to auscultation bilaterally, no wheezing, no crackles. Normal respiratory effort. No accessory muscle use.  Cardiovascular: Regular rate and rhythm, no murmurs / rubs / gallops. No  pedal edema. 2+ pedal pulses. No carotid bruits.  Abdomen: no tenderness, no masses palpated. No hepatosplenomegaly. Bowel sounds positive.  Musculoskeletal: no clubbing / cyanosis. No joint deformity upper and lower extremities. Good ROM, no contractures. Normal muscle tone.  Normal movement of bilateral elbows, left wrist range of motion significantly limited by pain and swelling.  Tenderness to palpation of left wrist, swelling involving wrist extending towards lower third of left forearm Skin: no rashes, lesions, ulcers. No induration Neurologic: CN 2-12 grossly intact. Strength 5/5 in all 4.  Psychiatric: Normal judgment and insight. Alert and oriented x 3. Normal mood.        Labs on Admission: I have personally reviewed following labs and imaging studies  CBC: Recent Labs  Lab 10/08/17 1204 10/14/17 1658  WBC 6.5 7.2  NEUTROABS 4.4 5.1  HGB 7.4* 6.1*  HCT 22.5* 19.1*  MCV 97.4 100.0  PLT 427* 433*   Basic Metabolic Panel: Recent Labs  Lab  10/08/17 1204 10/14/17 1658  NA 137 140  K 5.0 4.5  CL 110 110  CO2 21* 21*  GLUCOSE 100* 100*  BUN 18 23*  CREATININE 1.22* 1.64*  CALCIUM 9.7 9.2   GFR: Estimated Creatinine Clearance: 27.2 mL/min (A) (by C-G formula based on SCr of 1.64 mg/dL (H)). Liver Function Tests: Recent Labs  Lab 10/08/17 1204 10/14/17 1658  AST 27 17  ALT 23 14  ALKPHOS 137* 109  BILITOT 2.5* 1.6*  PROT 6.8 6.5  ALBUMIN 4.3 3.9   Anemia Panel: Recent Labs    10/14/17 1658  RETICCTPCT 22.2*   Urine analysis:    Component Value Date/Time   COLORURINE AMBER (A) 12/02/2015 1658   APPEARANCEUR CLEAR 12/02/2015 1658   LABSPEC 1.012 12/02/2015  [place] 5.5 12/02/2015 1658   GLUCOSEU NEGATIVE 12/02/2015 1658   HGBUR MODERATE (A) 12/02/2015 1658   BILIRUBINUR NEGATIVE 12/02/2015 1658   KETONESUR NEGATIVE 12/02/2015 1658   PROTEINUR 100 (A) 12/02/2015 1658   NITRITE NEGATIVE 12/02/2015 1658   LEUKOCYTESUR NEGATIVE 12/02/2015 1658    Radiological Exams on Admission: Dg Chest 2 View  Result Date: 10/14/2017 CLINICAL DATA:  Initial evaluation for acute cough, shortness of breath. Sickle cell crisis. EXAM: CHEST - 2 VIEW COMPARISON:  Prior radiograph from 10/08/2017. FINDINGS: Moderate cardiomegaly, stable from previous. Mediastinal silhouette normal. Aortic atherosclerosis. Lungs mildly hypoinflated. Perihilar vascular congestion without overt pulmonary edema. Patchy and linear bibasilar opacities, right greater than left, similar to previous exams, likely atelectasis and/or chronic scarring. No other focal infiltrates. No pleural effusion. No pneumothorax. No acute osseous abnormality. IMPRESSION: 1. Cardiomegaly with perihilar vascular congestion without overt pulmonary edema. 2. Patchy and linear bibasilar opacities, right greater than left, similar to previous exams, and likely reflecting atelectasis and/or chronic scarring. 3. Aortic atherosclerosis. Electronically Signed   By: Jeannine Boga M.D.   On: 10/14/2017 18:16   Dg Wrist Complete Left  Result Date: 10/14/2017 CLINICAL DATA:  Initial evaluation for acute swelling, no trauma. EXAM: LEFT WRIST - COMPLETE 3+ VIEW COMPARISON:  None. FINDINGS: Fracture involving the waist of the scaphoid with 5 mm of distraction, favored to be chronic. No other acute fracture dislocation. Bones are diffusely osteopenic. Scattered osteoarthritic changes noted about the proximal left hand. Diffuse soft tissue swelling. IMPRESSION: 1. Fracture involving the scaphoid waist with 5 mm of distraction, favored to be chronic in nature. Correlation with physical exam recommended. 2. No other acute osseous abnormality about the wrist. 3. Diffuse osteopenia. Electronically Signed   By: Jeannine Boga M.D.   On: 10/14/2017 18:22   Dg Hand Complete Left  Result Date: 10/14/2017 CLINICAL DATA:  Initial evaluation for acute swelling, no trauma. EXAM: LEFT HAND - COMPLETE 3+ VIEW COMPARISON:  None. FINDINGS: Fracture extending through the waist of the scaphoid noted with 5 mm of distraction, favored to be chronic in nature. Bones are diffusely osteopenia no other acute fracture or dislocation. Mild scattered osteoarthritic changes present about the hand, most notable at the first [facility] joint. Mild diffuse soft tissue swelling. IMPRESSION: 1. Fracture involving the scaphoid waist with 5 mm of distraction, favored to be chronic in nature. Correlation with physical exam recommended. 2. No other acute osseous abnormality about the left hand. 3. Diffuse osteopenia. Electronically Signed   By: Jeannine Boga M.D.   On: 10/14/2017 18:20    EKG: Independently reviewed.   Assessment/Plan Principal Problem:   Left wrist pain Active Problems:   Anemia   Sickle cell disease (HCC)   Gout  Left Wrist pain- with swelling. Likely 2/2 Gout. Afebrile.  History of gout. WBC- 7.2.  Completed course of Keflex outpatient.  -Prednisone 50 mg daily - No NSAIDs  with AKI, Anemia -PRN hydrocodone acetaminophen  Acute symptomatic anemia- Hgb 6.1, Baseline ~ 7. Hx of sickle cell disease.  History of sickle cell disease.  Hypoxia O2 sats 88%, reported shortness of breath with exertion.  Initial tachycardia to 115 in ED -Transfuse 1 unit PRBC -CBC a.m. -Anemia panel - FOBT  SOB- ? Acute anemia, Vs Chest xray-cardiomegaly with perihilar vascular congestion without overt pulmonary edema. Echo 11/2015- EF- 29-47%, normal diastholic function. Home medications Lasix 40 mg daily as needed for leg swelling.  Weights over the past 3 years- actually improved.  Appearing euvolemic at this time. - repeat Echo - Check BNP- elevated 612, compared to prior 253. - Transfuse - BMP a.m - Daily weights, input-output -  Iv lasix $Remove'40mg'[id number]$  X 1 with Transfusion.  AKI on CKD3- Cr- 1.6, recent baseline 1.1- 1.2. Chest xray suggest edema. Patient with mild hypoxia.  Weights improved over the past 3 years.  No fluid loss history. Appears euvolemic. - Transfuse - BMP a.m  Sickle cell disease-denies increased diffuse body pain.  Drowsy at the time of my evaluation s/p IV dilaudid -Reports PRN use of home OxyContin and Percocets. -PRN hydrocodone acetaminophen for now  DVT prophylaxis: Scds Code Status:Full Family Communication: None at bedside Disposition Plan: Per rounding team Consults called: None Admission status: Obs, tele   Bethena Roys MD [facility] Pager [phone] From 6PM-2AM.  Otherwise please contact night-coverage [email or web address] Password [facility]  10/14/2017, 9:18 PM

## 2017-10-15 ENCOUNTER — Inpatient Hospital Stay (HOSPITAL_COMMUNITY): Payer: MEDICARE

## 2017-10-15 DIAGNOSIS — M25532 Pain in left wrist: Secondary | ICD-10-CM | POA: Diagnosis not present

## 2017-10-15 DIAGNOSIS — N183 Chronic kidney disease, stage 3 (moderate): Secondary | ICD-10-CM | POA: Diagnosis present

## 2017-10-15 DIAGNOSIS — D5700 Hb-SS disease with crisis, unspecified: Secondary | ICD-10-CM | POA: Diagnosis present

## 2017-10-15 DIAGNOSIS — M10032 Idiopathic gout, left wrist: Secondary | ICD-10-CM | POA: Diagnosis present

## 2017-10-15 DIAGNOSIS — Z87891 Personal history of nicotine dependence: Secondary | ICD-10-CM | POA: Diagnosis not present

## 2017-10-15 DIAGNOSIS — M79602 Pain in left arm: Secondary | ICD-10-CM | POA: Diagnosis present

## 2017-10-15 DIAGNOSIS — Z90710 Acquired absence of both cervix and uterus: Secondary | ICD-10-CM | POA: Diagnosis not present

## 2017-10-15 DIAGNOSIS — E875 Hyperkalemia: Secondary | ICD-10-CM | POA: Diagnosis present

## 2017-10-15 DIAGNOSIS — G894 Chronic pain syndrome: Secondary | ICD-10-CM | POA: Diagnosis present

## 2017-10-15 DIAGNOSIS — Z79899 Other long term (current) drug therapy: Secondary | ICD-10-CM | POA: Diagnosis not present

## 2017-10-15 DIAGNOSIS — R0902 Hypoxemia: Secondary | ICD-10-CM | POA: Diagnosis present

## 2017-10-15 DIAGNOSIS — N179 Acute kidney failure, unspecified: Secondary | ICD-10-CM | POA: Diagnosis present

## 2017-10-15 LAB — TROPONIN I

## 2017-10-15 LAB — BASIC METABOLIC PANEL
Anion gap: 8 (ref 5–15)
BUN: 23 mg/dL — AB (ref 6–20)
CALCIUM: 9.1 mg/dL (ref 8.9–10.3)
CO2: 20 mmol/L — ABNORMAL LOW (ref 22–32)
Chloride: 112 mmol/L — ABNORMAL HIGH (ref 101–111)
Creatinine, Ser: 1.54 mg/dL — ABNORMAL HIGH (ref 0.44–1.00)
GFR calc Af Amer: 38 mL/min — ABNORMAL LOW (ref >60)
GFR, EST NON AFRICAN AMERICAN: 33 mL/min — AB (ref >60)
GLUCOSE: 134 mg/dL — AB (ref 65–99)
POTASSIUM: 5.6 mmol/L — AB (ref 3.5–5.1)
Sodium: 140 mmol/L (ref 135–145)

## 2017-10-15 LAB — CBC
HEMATOCRIT: 17.2 % — AB (ref 36.0–46.0)
Hemoglobin: 5.7 g/dL — CL (ref 12.0–15.0)
MCH: 32.8 pg (ref 26.0–34.0)
MCHC: 33.1 g/dL (ref 30.0–36.0)
MCV: 98.9 fL (ref 78.0–100.0)
Platelets: 379 10*3/uL (ref 150–400)
RBC: 1.74 MIL/uL — ABNORMAL LOW (ref 3.87–5.11)
RDW: 18.8 % — ABNORMAL HIGH (ref 11.5–15.5)
WBC: 7.7 10*3/uL (ref 4.0–10.5)

## 2017-10-15 LAB — PREPARE RBC (CROSSMATCH)

## 2017-10-15 LAB — HEMOGLOBIN AND HEMATOCRIT, BLOOD
HEMATOCRIT: 21.2 % — AB (ref 36.0–46.0)
Hemoglobin: 7 g/dL — ABNORMAL LOW (ref 12.0–15.0)

## 2017-10-15 LAB — FOLATE: Folate: 28.9 ng/mL (ref >5.9)

## 2017-10-15 MED ORDER — CHLORHEXIDINE GLUCONATE 0.12 % MT SOLN
15.0000 mL | Freq: Two times a day (BID) | OROMUCOSAL | Status: DC
  Administered 2017-10-15 – 2017-10-18 (×5): 15 mL via OROMUCOSAL
  Filled 2017-10-15 (×6): qty 15

## 2017-10-15 MED ORDER — OXYCODONE HCL ER 20 MG PO T12A
80.0000 mg | EXTENDED_RELEASE_TABLET | Freq: Two times a day (BID) | ORAL | Status: DC
  Administered 2017-10-15 – 2017-10-19 (×8): 80 mg via ORAL
  Filled 2017-10-15 (×8): qty 4

## 2017-10-15 MED ORDER — FUROSEMIDE 10 MG/ML IJ SOLN
40.0000 mg | Freq: Once | INTRAMUSCULAR | Status: AC
  Administered 2017-10-15: 40 mg via INTRAVENOUS
  Filled 2017-10-15: qty 4

## 2017-10-15 MED ORDER — OXYCODONE HCL 5 MG PO TABS
5.0000 mg | ORAL_TABLET | ORAL | Status: DC | PRN
  Administered 2017-10-15 (×3): 5 mg via ORAL
  Filled 2017-10-15 (×4): qty 1

## 2017-10-15 MED ORDER — OXYCODONE HCL ER 20 MG PO T12A
80.0000 mg | EXTENDED_RELEASE_TABLET | Freq: Two times a day (BID) | ORAL | Status: DC | PRN
  Administered 2017-10-15: 80 mg via ORAL
  Filled 2017-10-15: qty 4

## 2017-10-15 MED ORDER — SODIUM CHLORIDE 0.9 % IV SOLN
Freq: Once | INTRAVENOUS | Status: AC
  Administered 2017-10-15: 10:00:00 via INTRAVENOUS

## 2017-10-15 MED ORDER — SODIUM POLYSTYRENE SULFONATE 15 GM/60ML PO SUSP
30.0000 g | Freq: Once | ORAL | Status: AC
  Administered 2017-10-15: 30 g via ORAL
  Filled 2017-10-15: qty 120

## 2017-10-15 MED ORDER — HYDROMORPHONE HCL 1 MG/ML IJ SOLN
2.0000 mg | INTRAMUSCULAR | Status: AC | PRN
  Administered 2017-10-15 (×3): 2 mg via INTRAVENOUS
  Filled 2017-10-15 (×3): qty 2

## 2017-10-15 MED ORDER — ORAL CARE MOUTH RINSE
15.0000 mL | Freq: Two times a day (BID) | OROMUCOSAL | Status: DC
  Administered 2017-10-15 – 2017-10-17 (×4): 15 mL via OROMUCOSAL

## 2017-10-15 NOTE — CV Procedure (Addendum)
2D echo attempted but patient stated she wanted to get blood results back first. Then when she was told results, then she stated she didn't want the Echo until after she gets blood. Will try echo at a later time

## 2017-10-15 NOTE — Progress Notes (Signed)
Subjective: A 72 year old female admitted with left wrist pain and swelling suspected to be acute gouty attacks. She has sickle cell disease and symptoms to be getting sickle cell crisis.  Patient has only been on hydrocodone but she takes OxyContin 80 mg twice a day with oxycodone at home.  Her pain is a 10 out of 10 noted to relieve at the moment.  She had acute kidney injury so not getting nonsteroidal anti-inflammatory agents.  Patient also has dropped hemoglobin to 5.7 from 6.1.  Her baseline hemoglobin is around 7.  Objective: Vital signs in last 24 hours: Temp:  [98.6 F (37 C)-99.9 F (37.7 C)] 98.6 F (37 C) (05/29 0705) Pulse Rate:  [80-115] 80 (05/29 0705) Resp:  [9-20] 18 (05/29 0705) BP: (108-130)/(49-94) 108/94 (05/29 0705) SpO2:  [88 %-100 %] 100 % (05/29 0705) Weight:  [64.4 kg (142 lb)-65.6 kg (144 lb 10 oz)] 65.6 kg (144 lb 10 oz) (05/28 2323) Weight change:     Intake/Output from previous day: No intake/output data recorded. Intake/Output this shift: Total I/O In: 364 [Blood:364] Out: -   Head: Normocephalic, without obvious abnormality, atraumatic Neck: no adenopathy, no carotid bruit, no JVD, supple, symmetrical, trachea midline and thyroid not enlarged, symmetric, no tenderness/mass/nodules Back: symmetric, no curvature. ROM normal. No CVA tenderness. Cardio: regular rate and rhythm, S1, S2 normal, no murmur, click, rub or gallop GI: soft, non-tender; bowel sounds normal; no masses,  no organomegaly Extremities: left wrist swollen and tender, no significant edema in the extremities Pulses: 2+ and symmetric Neurologic: Grossly normal  Lab Results: Recent Labs    10/14/17 1658 10/15/17 0302  WBC 7.2 7.7  HGB 6.1* 5.7*  HCT 19.1* 17.2*  PLT 433* 379   BMET Recent Labs    10/14/17 1658 10/15/17 0302  NA 140 140  K 4.5 5.6*  CL 110 112*  CO2 21* 20*  GLUCOSE 100* 134*  BUN 23* 23*  CREATININE 1.64* 1.54*  CALCIUM 9.2 9.1     Studies/Results: Dg Chest 2 View  Result Date: 10/14/2017 CLINICAL DATA:  Initial evaluation for acute cough, shortness of breath. Sickle cell crisis. EXAM: CHEST - 2 VIEW COMPARISON:  Prior radiograph from 10/08/2017. FINDINGS: Moderate cardiomegaly, stable from previous. Mediastinal silhouette normal. Aortic atherosclerosis. Lungs mildly hypoinflated. Perihilar vascular congestion without overt pulmonary edema. Patchy and linear bibasilar opacities, right greater than left, similar to previous exams, likely atelectasis and/or chronic scarring. No other focal infiltrates. No pleural effusion. No pneumothorax. No acute osseous abnormality. IMPRESSION: 1. Cardiomegaly with perihilar vascular congestion without overt pulmonary edema. 2. Patchy and linear bibasilar opacities, right greater than left, similar to previous exams, and likely reflecting atelectasis and/or chronic scarring. 3. Aortic atherosclerosis. Electronically Signed   By: Jeannine Boga M.D.   On: 10/14/2017 18:16   Dg Wrist Complete Left  Result Date: 10/14/2017 CLINICAL DATA:  Initial evaluation for acute swelling, no trauma. EXAM: LEFT WRIST - COMPLETE 3+ VIEW COMPARISON:  None. FINDINGS: Fracture involving the waist of the scaphoid with 5 mm of distraction, favored to be chronic. No other acute fracture dislocation. Bones are diffusely osteopenic. Scattered osteoarthritic changes noted about the proximal left hand. Diffuse soft tissue swelling. IMPRESSION: 1. Fracture involving the scaphoid waist with 5 mm of distraction, favored to be chronic in nature. Correlation with physical exam recommended. 2. No other acute osseous abnormality about the wrist. 3. Diffuse osteopenia. Electronically Signed   By: Jeannine Boga M.D.   On: 10/14/2017 18:22   Dg Hand  Complete Left  Result Date: 10/14/2017 CLINICAL DATA:  Initial evaluation for acute swelling, no trauma. EXAM: LEFT HAND - COMPLETE 3+ VIEW COMPARISON:  None. FINDINGS:  Fracture extending through the waist of the scaphoid noted with 5 mm of distraction, favored to be chronic in nature. Bones are diffusely osteopenia no other acute fracture or dislocation. Mild scattered osteoarthritic changes present about the hand, most notable at the first [facility] joint. Mild diffuse soft tissue swelling. IMPRESSION: 1. Fracture involving the scaphoid waist with 5 mm of distraction, favored to be chronic in nature. Correlation with physical exam recommended. 2. No other acute osseous abnormality about the left hand. 3. Diffuse osteopenia. Electronically Signed   By: Jeannine Boga M.D.   On: 10/14/2017 18:20    Medications: I have reviewed the patient's current medications.  Assessment/Plan: A 72 year old female admitted with left wrist swelling and pain as well as sickle cell crisis  #1 left wrist swelling: Suspected gouty attacks but could also be sickle cell infarct crisis. I will start patient's home OxyContin and oxycodone.  IV Dilaudid when necessary and if not relieved start PCA.  #2 sickle cell anemia: Hemoglobin has dropped to 5.7.  Had typical hemoglobin is at 7. I will check her LDH.  Reticulocyte count has gone to 424.  I suspect acute hemolytic crisis.  We'll transfuse one unit of packed red blood cells most likely  #3 Acute kidney injury: BUN and creatinine has gone from 1.6-1.5.  Continue to avoid non steroidals anti-inflammatory agents.  #4 hypokalemia:Probably due to hemolysis.  Give Kayexalate.   LOS: 0 days   GARBA,LAWAL 10/15/2017, 9:24 AM

## 2017-10-16 ENCOUNTER — Inpatient Hospital Stay (HOSPITAL_COMMUNITY): Payer: MEDICARE

## 2017-10-16 LAB — BPAM RBC
BLOOD PRODUCT EXPIRATION DATE: 201906172359
BLOOD PRODUCT EXPIRATION DATE: 201906192359
ISSUE DATE / TIME: 201905290353
ISSUE DATE / TIME: 201905291837
UNIT TYPE AND RH: 5100
Unit Type and Rh: 5100

## 2017-10-16 LAB — TYPE AND SCREEN
ABO/RH(D): B POS
Antibody Screen: NEGATIVE
Unit division: 0
Unit division: 0

## 2017-10-16 LAB — HEMOGLOBIN AND HEMATOCRIT, BLOOD
HCT: 27.6 % — ABNORMAL LOW (ref 36.0–46.0)
Hemoglobin: 9.1 g/dL — ABNORMAL LOW (ref 12.0–15.0)

## 2017-10-16 MED ORDER — HYDROMORPHONE HCL 1 MG/ML IJ SOLN
2.0000 mg | INTRAMUSCULAR | Status: DC | PRN
  Administered 2017-10-16 – 2017-10-18 (×16): 2 mg via INTRAVENOUS
  Filled 2017-10-16 (×16): qty 2

## 2017-10-16 NOTE — Progress Notes (Signed)
Subjective: Patient is complaining of continued pain in her arms and legs.  She cannot take Dilaudid PCA because it has been tried before and she had an issue with it.  She was refusing to have arm pain medications today because she had no IV pain medicine.  The swelling in her hand has improved.  It is still very painful and she cannot use the left wrist.  Objective: Vital signs in last 24 hours: Temp:  [98.7 F (37.1 C)-99.9 F (37.7 C)] 99.9 F (37.7 C) (05/30 0706) Pulse Rate:  [84-88] 88 (05/30 0706) Resp:  [14-20] 20 (05/30 0706) BP: (116-139)/(45-68) 123/45 (05/30 0706) SpO2:  [95 %-100 %] 95 % (05/30 0706) Weight change:     Intake/Output from previous day: 05/29 0701 - 05/30 0700 In: 1236 [P.O.:480; Blood:756] Out: -  Intake/Output this shift: No intake/output data recorded.  Head: Normocephalic, without obvious abnormality, atraumatic Neck: no adenopathy, no carotid bruit, no JVD, supple, symmetrical, trachea midline and thyroid not enlarged, symmetric, no tenderness/mass/nodules Back: symmetric, no curvature. ROM normal. No CVA tenderness. Cardio: regular rate and rhythm, S1, S2 normal, no murmur, click, rub or gallop GI: soft, non-tender; bowel sounds normal; no masses,  no organomegaly Extremities: left wrist swollen and tender, no significant edema in the extremities Pulses: 2+ and symmetric Neurologic: Grossly normal  Lab Results: Recent Labs    10/14/17 1658 10/15/17 0302 10/15/17 1346 10/15/17 2345  WBC 7.2 7.7  --   --   HGB 6.1* 5.7* 7.0* 9.1*  HCT 19.1* 17.2* 21.2* 27.6*  PLT 433* 379  --   --    BMET Recent Labs    10/14/17 1658 10/15/17 0302  NA 140 140  K 4.5 5.6*  CL 110 112*  CO2 21* 20*  GLUCOSE 100* 134*  BUN 23* 23*  CREATININE 1.64* 1.54*  CALCIUM 9.2 9.1    Studies/Results: Dg Chest 2 View  Result Date: 10/14/2017 CLINICAL DATA:  Initial evaluation for acute cough, shortness of breath. Sickle cell crisis. EXAM: CHEST - 2 VIEW  COMPARISON:  Prior radiograph from 10/08/2017. FINDINGS: Moderate cardiomegaly, stable from previous. Mediastinal silhouette normal. Aortic atherosclerosis. Lungs mildly hypoinflated. Perihilar vascular congestion without overt pulmonary edema. Patchy and linear bibasilar opacities, right greater than left, similar to previous exams, likely atelectasis and/or chronic scarring. No other focal infiltrates. No pleural effusion. No pneumothorax. No acute osseous abnormality. IMPRESSION: 1. Cardiomegaly with perihilar vascular congestion without overt pulmonary edema. 2. Patchy and linear bibasilar opacities, right greater than left, similar to previous exams, and likely reflecting atelectasis and/or chronic scarring. 3. Aortic atherosclerosis. Electronically Signed   By: Jeannine Boga M.D.   On: 10/14/2017 18:16   Dg Wrist Complete Left  Result Date: 10/14/2017 CLINICAL DATA:  Initial evaluation for acute swelling, no trauma. EXAM: LEFT WRIST - COMPLETE 3+ VIEW COMPARISON:  None. FINDINGS: Fracture involving the waist of the scaphoid with 5 mm of distraction, favored to be chronic. No other acute fracture dislocation. Bones are diffusely osteopenic. Scattered osteoarthritic changes noted about the proximal left hand. Diffuse soft tissue swelling. IMPRESSION: 1. Fracture involving the scaphoid waist with 5 mm of distraction, favored to be chronic in nature. Correlation with physical exam recommended. 2. No other acute osseous abnormality about the wrist. 3. Diffuse osteopenia. Electronically Signed   By: Jeannine Boga M.D.   On: 10/14/2017 18:22   Dg Hand Complete Left  Result Date: 10/14/2017 CLINICAL DATA:  Initial evaluation for acute swelling, no trauma. EXAM: LEFT HAND -  COMPLETE 3+ VIEW COMPARISON:  None. FINDINGS: Fracture extending through the waist of the scaphoid noted with 5 mm of distraction, favored to be chronic in nature. Bones are diffusely osteopenia no other acute fracture or  dislocation. Mild scattered osteoarthritic changes present about the hand, most notable at the first [facility] joint. Mild diffuse soft tissue swelling. IMPRESSION: 1. Fracture involving the scaphoid waist with 5 mm of distraction, favored to be chronic in nature. Correlation with physical exam recommended. 2. No other acute osseous abnormality about the left hand. 3. Diffuse osteopenia. Electronically Signed   By: Jeannine Boga M.D.   On: 10/14/2017 18:20    Medications: I have reviewed the patient's current medications.  Assessment/Plan: A 72 year old female admitted with left wrist swelling and pain as well as sickle cell crisis  #1 left wrist swelling: I have discussed with the patient the need for her to take her medications as prescribed.  Also warm compresses of the hand.  She has suspected gouty attacks but could also be sickle cell infarct crisis. I will continue patient's home OxyContin and oxycodone.  IV Dilaudid when necessary and if not relieved start PCA.  #2 sickle cell anemia: Hemoglobin has improved with transfusion of one unit of packed red blood cells.  It is now but 7 g.  Recheck in the morning.  #3 Acute kidney injury: BUN and creatinine has gone from 1.6-1.59.  Continue to avoid non steroidals anti-inflammatory agents. Recheck renal function tomorrow  #4 hyperkalemia: Kayexalate was given yesterday.  We will recheck in the morning   LOS: 1 day   GARBA,LAWAL 10/16/2017, 8:01 AM

## 2017-10-16 NOTE — Plan of Care (Signed)
Patient upset earlier in the shift d/t IV pain medication being stopped the day before.  This appears to have been a miscommunication in the ordering of her pain medications, Dr. Jonelle Sidle reordered IV Dilaudid which patient received x 2 this shift with improvement in left wrist.  Patient anticipates discharge back home 10/17/17,

## 2017-10-16 NOTE — CV Procedure (Signed)
2D echo attempted again today, patient  refused testing until she sees physician.

## 2017-10-16 NOTE — Progress Notes (Signed)
RN in to assess patient, patient appears upset, lets RN examine her but refuses all medications.  Attempted to educate patient that the Prednisone she is ordered is for her left wrist pain and strongly encouraged her to take this.  Patient continues to refuse.  Patient also refusing oral narcotic pain medications, says they do not help.    Notified physician of above. No new orders received.

## 2017-10-17 DIAGNOSIS — D5700 Hb-SS disease with crisis, unspecified: Secondary | ICD-10-CM

## 2017-10-17 DIAGNOSIS — M25532 Pain in left wrist: Secondary | ICD-10-CM

## 2017-10-17 DIAGNOSIS — M10032 Idiopathic gout, left wrist: Principal | ICD-10-CM

## 2017-10-17 MED ORDER — METHYLPREDNISOLONE SODIUM SUCC 125 MG IJ SOLR
80.0000 mg | Freq: Once | INTRAMUSCULAR | Status: AC
  Administered 2017-10-17: 80 mg via INTRAVENOUS
  Filled 2017-10-17: qty 2

## 2017-10-17 NOTE — Progress Notes (Signed)
Patient ID: Chelsea Hensley, female   DOB: 09-12-1945, 72 y.o.   MRN: [id number] Subjective:  Patient is complaining of ongoing significant pain in her left hand and wrist joint. Patient also complained of increasing swelling in her left knuckles, warmth and unable to hold object with her left hand. No fever, no chest pain, no SOB  Objective:  Vital signs in last 24 hours:  Vitals:   10/16/17 2023 10/17/17 0628 10/17/17 1213 10/17/17 1355  BP: (!) 145/66 140/64  (!) 127/53  Pulse: 99 83  86  Resp: $Remo'20 20  11  'LXbhl$ Temp:  98.3 F (36.8 C)  98.7 F (37.1 C)  TempSrc:  Oral  Oral  SpO2: 92% 98%  99%  Weight:   65.2 kg (143 lb 11.8 oz)   Height:       Intake/Output from previous day:   Intake/Output Summary (Last 24 hours) at 10/17/2017 1811 Last data filed at 10/17/2017 1413 Gross per 24 hour  Intake 120 ml  Output 1600 ml  Net -1480 ml    Physical Exam: General: Alert, awake, oriented x3, in no acute distress. [place] HEENT: [place]/AT PEERL, EOMI Neck: Trachea midline,  no masses, no thyromegal,y no JVD, no carotid bruit OROPHARYNX:  Moist, No exudate/ erythema/lesions.  Heart: Regular rate and rhythm, without murmurs, rubs, gallops, PMI non-displaced, no heaves or thrills on palpation.  Lungs: Clear to auscultation, no wheezing or rhonchi noted. No increased vocal fremitus resonant to percussion  Abdomen: Soft, nontender, nondistended, positive bowel sounds, no masses no hepatosplenomegaly noted..  Neuro: No focal neurological deficits noted cranial nerves II through XII grossly intact. DTRs 2+ bilaterally upper and lower extremities. Strength 5 out of 5 in bilateral upper and lower extremities. Musculoskeletal: Left hand is swollen at MCPs, warm to touch, no erythema. And swollen left wrist joint. No warm swelling or erythema around joints, no spinal tenderness noted. Psychiatric: Patient alert and oriented x3, good insight and cognition, good recent to remote recall. Lymph node survey: No  cervical axillary or inguinal lymphadenopathy noted.  Lab Results:  Basic Metabolic Panel:    Component Value Date/Time   NA 140 10/15/2017 0302   K 5.6 (H) 10/15/2017 0302   CL 112 (H) 10/15/2017 0302   CO2 20 (L) 10/15/2017 0302   BUN 23 (H) 10/15/2017 0302   CREATININE 1.54 (H) 10/15/2017 0302   CREATININE 1.16 (H) 01/12/2016 1007   GLUCOSE 134 (H) 10/15/2017 0302   CALCIUM 9.1 10/15/2017 0302   CBC:    Component Value Date/Time   WBC 7.7 10/15/2017 0302   HGB 9.1 (L) 10/15/2017 2345   HCT 27.6 (L) 10/15/2017 2345   PLT 379 10/15/2017 0302   MCV 98.9 10/15/2017 0302   NEUTROABS 5.1 10/14/2017 1658   LYMPHSABS 0.9 10/14/2017 1658   MONOABS 0.8 10/14/2017 1658   EOSABS 0.3 10/14/2017 1658   BASOSABS 0.1 10/14/2017 1658    No results found for this or any previous visit (from the past 240 hour(s)).  Studies/Results: No results found.  Medications: Scheduled Meds: . chlorhexidine  15 mL Mouth Rinse BID  . folic acid  1 mg Oral Daily  . mouth rinse  15 mL Mouth Rinse q12n4p  . multivitamin with minerals  1 tablet Oral Daily  . oxyCODONE  80 mg Oral Q12H  . predniSONE  50 mg Oral Q breakfast   Continuous Infusions: PRN Meds:.HYDROmorphone (DILAUDID) injection, ondansetron **OR** ondansetron (ZOFRAN) IV, oxyCODONE  Consultants:  None  Procedures:  None  Antibiotics:  None  Assessment/Plan: Principal Problem:   Left wrist pain Active Problems:   Anemia   Sickle cell disease (HCC)   Gout   Left arm pain  1. 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 In view of ARF, will not be able to get NSAID. Will give a shot of IV solumedrol, then continue prednisone. IV Dilaudid and PO OxyContin and Oxycodone. 2. Hb Sickle Cell Disease with crisis:  Give gentle hydration, IV Dilaudid, Monitor vitals very closely, Re-evaluate pain scale regularly, 2 L of Oxygen by [place]. 3. Sickle Cell Anemia: S/P blood transfusion. She is now stable, Hb is up to 9.1. Will continue to monitor 4. Chronic pain Syndrome: Continue home pain medications of OxyContin and increase Oxycodone to 10 mg from 5 5. Acute Kidney Injury: Avoid NSAIDs and other nephrotoxins. Gentle hydration 6. Hyperkalemia: S/P Kayexalate. Serum K is improving.   Code Status: Full Code Family Communication: N/A Disposition Plan: Not yet ready for discharge  Chelsea Hensley  If 7PM-7AM, please contact night-coverage.  10/17/2017, 6:11 PM  LOS: 2 days

## 2017-10-17 NOTE — Care Management Note (Signed)
Case Management Note  Patient Details  Name: Chelsea Hensley MRN: [id number] Date of Birth: [birth year]  Subjective/Objective:  Pt with sickle cell disease admitted with left arm pain, SOB.                  Action/Plan: Discharge home with no needs   Expected Discharge Date:  (unknown)               Expected Discharge Plan:  Home/Self Care  In-House Referral:     Discharge planning Services  CM Consult  Post Acute Care Choice:    Choice offered to:     DME Arranged:    DME Agency:     HH Arranged:    HH Agency:     Status of Service:  Completed, signed off  If discussed at [employer] of Stay Meetings, dates discussed:    Additional CommentsPurcell Mouton, RN 10/17/2017, 11:42 AM

## 2017-10-18 DIAGNOSIS — M79602 Pain in left arm: Secondary | ICD-10-CM

## 2017-10-18 MED ORDER — HYDROMORPHONE HCL 1 MG/ML IJ SOLN
2.0000 mg | INTRAMUSCULAR | Status: DC
  Administered 2017-10-18 – 2017-10-19 (×13): 2 mg via INTRAVENOUS
  Filled 2017-10-18 (×13): qty 2

## 2017-10-18 MED ORDER — DEXTROSE-NACL 5-0.45 % IV SOLN
INTRAVENOUS | Status: DC
  Administered 2017-10-18 – 2017-10-19 (×2): via INTRAVENOUS

## 2017-10-18 MED ORDER — OXYCODONE HCL 5 MG PO TABS: 10.0000 mg | ORAL_TABLET | ORAL | Status: DC | PRN

## 2017-10-18 NOTE — Plan of Care (Signed)
Patient stable during 7 a to 7 p shift, receiving Dilaudid every 2 hours scheduled and feels this is improving her pain.  Hopeful for discharge on 10/19/17 home with daughter.

## 2017-10-18 NOTE — Progress Notes (Signed)
Pt worried about pain control.  Day uneventful

## 2017-10-18 NOTE — Progress Notes (Signed)
Patient ID: Chelsea Hensley, female   DOB: 11-08-1945, 72 y.o.   MRN: [id number] Subjective:  Patient claims pain is better but frustrated that she had to call for her pain medication and after calling, it takes a while before she gets the injection. She claimed that during the night it took almost 45 min after calling before she got her injection. She has no new complaint, swelling is much reduced but still in pain. No redness, no leg swelling, no chest pain, no SOB.   Objective:  Vital signs in last 24 hours:  Vitals:   10/17/17 1355 10/17/17 2131 10/18/17 0524 10/18/17 1410  BP: (!) 127/53 (!) 158/72 (!) 145/68 135/62  Pulse: 86 (!) 107 98 84  Resp: $Remo'11 20 16 20  'CLMdq$ Temp: 98.7 F (37.1 C)  99 F (37.2 C) 99 F (37.2 C)  TempSrc: Oral   Oral  SpO2: 99% 95% 97% 90%  Weight:   63.6 kg (140 lb 3.4 oz)   Height:        Intake/Output from previous day:   Intake/Output Summary (Last 24 hours) at 10/18/2017 1800 Last data filed at 10/18/2017 1411 Gross per 24 hour  Intake 490 ml  Output 1100 ml  Net -610 ml    Physical Exam: General: Alert, awake, oriented x3, in no acute distress. Elderly  HEENT: [place]/AT PEERL, EOMI Neck: Trachea midline,  no masses, no thyromegal,y no JVD, no carotid bruit OROPHARYNX:  Moist, No exudate/ erythema/lesions.  Heart: Regular rate and rhythm, without murmurs, rubs, gallops, PMI non-displaced, no heaves or thrills on palpation.  Lungs: Clear to auscultation, no wheezing or rhonchi noted. No increased vocal fremitus resonant to percussion  Abdomen: Soft, nontender, nondistended, positive bowel sounds, no masses no hepatosplenomegaly noted..  Neuro: No focal neurological deficits noted cranial nerves II through XII grossly intact. DTRs 2+ bilaterally upper and lower extremities. Strength 5 out of 5 in bilateral upper and lower extremities. Musculoskeletal: Swelling much improved in MCPs and wrist joints, no erythema. No warm swelling or erythema around lower limb  joints, no spinal tenderness noted. Psychiatric: Patient alert and oriented x3, good insight and cognition, good recent to remote recall. Lymph node survey: No cervical axillary or inguinal lymphadenopathy noted.  Lab Results:  Basic Metabolic Panel:    Component Value Date/Time   NA 140 10/15/2017 0302   K 5.6 (H) 10/15/2017 0302   CL 112 (H) 10/15/2017 0302   CO2 20 (L) 10/15/2017 0302   BUN 23 (H) 10/15/2017 0302   CREATININE 1.54 (H) 10/15/2017 0302   CREATININE 1.16 (H) 01/12/2016 1007   GLUCOSE 134 (H) 10/15/2017 0302   CALCIUM 9.1 10/15/2017 0302   CBC:    Component Value Date/Time   WBC 7.7 10/15/2017 0302   HGB 9.1 (L) 10/15/2017 2345   HCT 27.6 (L) 10/15/2017 2345   PLT 379 10/15/2017 0302   MCV 98.9 10/15/2017 0302   NEUTROABS 5.1 10/14/2017 1658   LYMPHSABS 0.9 10/14/2017 1658   MONOABS 0.8 10/14/2017 1658   EOSABS 0.3 10/14/2017 1658   BASOSABS 0.1 10/14/2017 1658    No results found for this or any previous visit (from the past 240 hour(s)).  Studies/Results: No results found.  Medications: Scheduled Meds: . chlorhexidine  15 mL Mouth Rinse BID  . folic acid  1 mg Oral Daily  .  HYDROmorphone (DILAUDID) injection  2 mg Intravenous Q2H  . mouth rinse  15 mL Mouth Rinse q12n4p  . multivitamin with minerals  1 tablet Oral  Daily  . oxyCODONE  80 mg Oral Q12H  . predniSONE  50 mg Oral Q breakfast   Continuous Infusions: . dextrose 5 % and 0.45% NaCl 75 mL/hr at 10/18/17 1237   PRN Meds:.ondansetron **OR** ondansetron (ZOFRAN) IV, oxyCODONE  Assessment/Plan: Principal Problem:   Left wrist pain Active Problems:   Anemia   Sickle cell disease (HCC)   Gout   Left arm pain   Sickle cell pain crisis ([place])  1. 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 In view of ARF, will not be able to get NSAID,  continue prednisone. Continue IV Dilaudid and PO OxyContin and Oxycodone. 2. Hb Sickle Cell Disease with crisis: Continue  gentle hydration, change IV Dilaudid to round the clock Q2H instead of prn. Monitor vitals very closely, Re-evaluate pain scale regularly, 2 L of Oxygen by [place]. 3. Sickle Cell Anemia: S/P blood transfusion. She is now stable, Hb is up to 9.1. Will continue to monitor 4. Chronic pain Syndrome: Continue home pain medications of OxyContin and increase Oxycodone to 10 mg from 5 5. Acute Kidney Injury: Improving, Avoid NSAIDs and other nephrotoxins. Gentle hydration 6. Hyperkalemia: S/P Kayexalate. Serum K is improving.   Code Status: Full Code Family Communication: N/A Disposition Plan: For possible discharge tomorrow  Chelsea Hensley  If 7PM-7AM, please contact night-coverage.  10/18/2017, 6:00 PM  LOS: 3 days

## 2017-10-18 NOTE — Progress Notes (Signed)
Pt angry and upset states her pain has not been controlled. Pt states "you have not brought me my pain medicine on time not once tonight." Despite education on needing to call nurse for pain medication when in pain, pt continues to not use her call light and states "you never brought me my pain medication on time last night." Pt again educated she needs to call for her pain medication because it is a PRN order. Pt offered to speak to management but denies at this time, states "she will file a report."

## 2017-10-18 NOTE — Progress Notes (Signed)
Pt refusing labs this morning. States she wants to talk to doctor Jonelle Sidle first. Pt educated. Will continue to monitor.

## 2017-10-19 LAB — COMPREHENSIVE METABOLIC PANEL
ALK PHOS: 77 U/L (ref 38–126)
ALT: 11 U/L — AB (ref 14–54)
AST: 14 U/L — AB (ref 15–41)
Albumin: 3.6 g/dL (ref 3.5–5.0)
Anion gap: 6 (ref 5–15)
BUN: 27 mg/dL — AB (ref 6–20)
CALCIUM: 9.8 mg/dL (ref 8.9–10.3)
CHLORIDE: 109 mmol/L (ref 101–111)
CO2: 24 mmol/L (ref 22–32)
CREATININE: 1.12 mg/dL — AB (ref 0.44–1.00)
GFR calc Af Amer: 56 mL/min — ABNORMAL LOW (ref >60)
GFR calc non Af Amer: 48 mL/min — ABNORMAL LOW (ref >60)
Glucose, Bld: 111 mg/dL — ABNORMAL HIGH (ref 65–99)
Potassium: 4.3 mmol/L (ref 3.5–5.1)
SODIUM: 139 mmol/L (ref 135–145)
Total Bilirubin: 2.3 mg/dL — ABNORMAL HIGH (ref 0.3–1.2)
Total Protein: 5.5 g/dL — ABNORMAL LOW (ref 6.5–8.1)

## 2017-10-19 LAB — CBC WITH DIFFERENTIAL/PLATELET
BASOS ABS: 0 10*3/uL (ref 0.0–0.1)
Basophils Relative: 0 %
EOS ABS: 0.2 10*3/uL (ref 0.0–0.7)
EOS PCT: 2 %
HCT: 28.7 % — ABNORMAL LOW (ref 36.0–46.0)
HEMOGLOBIN: 9 g/dL — AB (ref 12.0–15.0)
LYMPHS ABS: 3 10*3/uL (ref 0.7–4.0)
LYMPHS PCT: 37 %
MCH: 30.7 pg (ref 26.0–34.0)
MCHC: 31.4 g/dL (ref 30.0–36.0)
MCV: 98 fL (ref 78.0–100.0)
Monocytes Absolute: 1.2 10*3/uL — ABNORMAL HIGH (ref 0.1–1.0)
Monocytes Relative: 14 %
NEUTROS PCT: 47 %
Neutro Abs: 3.8 10*3/uL (ref 1.7–7.7)
PLATELETS: 374 10*3/uL (ref 150–400)
RBC: 2.93 MIL/uL — AB (ref 3.87–5.11)
RDW: 17.2 % — ABNORMAL HIGH (ref 11.5–15.5)
WBC: 8.2 10*3/uL (ref 4.0–10.5)

## 2017-10-19 MED ORDER — OXYCODONE HCL 10 MG PO TABS: 10.0000 mg | ORAL_TABLET | ORAL | 0 refills | Status: DC | PRN

## 2017-10-19 MED ORDER — PREDNISONE 20 MG PO TABS: ORAL_TABLET | ORAL | 0 refills | Status: DC

## 2017-10-19 MED ORDER — METHYLPREDNISOLONE SODIUM SUCC 125 MG IJ SOLR
80.0000 mg | Freq: Once | INTRAMUSCULAR | Status: AC
  Administered 2017-10-19: 80 mg via INTRAVENOUS
  Filled 2017-10-19: qty 2

## 2017-10-19 NOTE — Discharge Instructions (Signed)
Sickle Cell Anemia, Adult Sickle cell anemia is a condition in which red blood cells have an abnormal sickle shape. This abnormal shape shortens the cells life span, which results in a lower than normal concentration of red blood cells in the blood. The sickle shape also causes the cells to clump together and block free blood flow through the blood vessels. As a result, the tissues and organs of the body do not receive enough oxygen. Sickle cell anemia causes organ damage and pain and increases the risk of infection. What are the causes? Sickle cell anemia is a genetic disorder. Those who receive two copies of the gene have the condition, and those who receive one copy have the trait. What increases the risk? The sickle cell gene is most common in people whose families originated in [place]. Other areas of the globe where sickle cell trait occurs include the [place], [place], the [place], and the [place]. What are the signs or symptoms?  Pain, especially in the extremities, back, chest, or abdomen (common). The pain may start suddenly or may develop following an illness, especially if there is dehydration. Pain can also occur due to overexertion or exposure to extreme temperature changes.  Frequent severe bacterial infections, especially certain types of pneumonia and meningitis.  Pain and swelling in the hands and feet.  Decreased activity.  Loss of appetite.  Change in behavior.  Headaches.  Seizures.  Shortness of breath or difficulty breathing.  Vision changes.  Skin ulcers. Those with the trait may not have symptoms or they may have mild symptoms. How is this diagnosed? Sickle cell anemia is diagnosed with blood tests that demonstrate the genetic trait. It is often diagnosed during the newborn period, due to mandatory testing nationwide. A variety of blood tests, X-rays, CT scans, MRI scans, ultrasounds, and lung function tests may also be done to  monitor the condition. How is this treated? Sickle cell anemia may be treated with:  Medicines. You may be given pain medicines, antibiotic medicines (to treat and prevent infections) or medicines to increase the production of certain types of hemoglobin.  Fluids.  Oxygen.  Blood transfusions.  Follow these instructions at home:  Drink enough fluid to keep your urine clear or pale yellow. Increase your fluid intake in hot weather and during exercise.  Do not smoke. Smoking lowers oxygen levels in the blood.  Only take over-the-counter or prescription medicines for pain, fever, or discomfort as directed by your health care provider.  Take antibiotics as directed by your health care provider. Make sure you finish them it even if you start to feel better.  Take supplements as directed by your health care provider.  Consider wearing a medical alert bracelet. This tells anyone caring for you in an emergency of your condition.  When traveling, keep your medical information, health care provider's names, and the medicines you take with you at all times.  If you develop a fever, do not take medicines to reduce the fever right away. This could cover up a problem that is developing. Notify your health care provider.  Keep all follow-up appointments with your health care provider. Sickle cell anemia requires regular medical care. Contact a health care provider if: You have a fever. Get help right away if:  You feel dizzy or faint.  You have new abdominal pain, especially on the left side near the stomach area.  You develop a persistent, often uncomfortable and painful penile erection (priapism). If this is not  treated immediately it will lead to impotence.  You have numbness your arms or legs or you have a hard time moving them.  You have a hard time with speech.  You have a fever or persistent symptoms for more than 2-3 days.  You have a fever and your symptoms suddenly get  worse.  You have signs or symptoms of infection. These include: ? Chills. ? Abnormal tiredness (lethargy). ? Irritability. ? Poor eating. ? Vomiting.  You develop pain that is not helped with medicine.  You develop shortness of breath.  You have pain in your chest.  You are coughing up pus-like or bloody sputum.  You develop a stiff neck.  Your feet or hands swell or have pain.  Your abdomen appears bloated.  You develop joint pain. This information is not intended to replace advice given to you by your health care provider. Make sure you discuss any questions you have with your health care provider. Document Released: 08/14/2005 Document Revised: 11/24/2015 Document Reviewed: 12/16/2012 Elsevier Interactive Patient Education  2017 [place].  Gout Gout is painful swelling that can happen in some of your joints. Gout is a type of arthritis. This condition is caused by having too much uric acid in your body. Uric acid is a chemical that is made when your body breaks down substances called purines. If your body has too much uric acid, sharp crystals can form and build up in your joints. This causes pain and swelling. Gout attacks can happen quickly and be very painful (acute gout). Over time, the attacks can affect more joints and happen more often (chronic gout). Follow these instructions at home: During a Gout Attack  If directed, put ice on the painful area: ? Put ice in a plastic bag. ? Place a towel between your skin and the bag. ? Leave the ice on for 20 minutes, 2-3 times a day.  Rest the joint as much as possible. If the joint is in your leg, you may be given crutches to use.  Raise (elevate) the painful joint above the level of your heart as often as you can.  Drink enough fluids to keep your pee (urine) clear or pale yellow.  Take over-the-counter and prescription medicines only as told by your doctor.  Do not drive or use heavy machinery while taking  prescription pain medicine.  Follow instructions from your doctor about what you can or cannot eat and drink.  Return to your normal activities as told by your doctor. Ask your doctor what activities are safe for you. Avoiding Future Gout Attacks  Follow a low-purine diet as told by a specialist (dietitian) or your doctor. Avoid foods and drinks that have a lot of purines, such as: ? Liver. ? Kidney. ? Anchovies. ? Asparagus. ? Herring. ? Mushrooms ? Mussels. ? Beer.  Limit alcohol intake to no more than 1 drink a day for nonpregnant women and 2 drinks a day for men. One drink equals 12 oz of beer, 5 oz of wine, or 1 oz of hard liquor.  Stay at a healthy weight or lose weight if you are overweight. If you want to lose weight, talk with your doctor. It is important that you do not lose weight too fast.  Start or continue an exercise plan as told by your doctor.  Drink enough fluids to keep your pee clear or pale yellow.  Take over-the-counter and prescription medicines only as told by your doctor.  Keep all follow-up visits as  told by your doctor. This is important. Contact a doctor if:  You have another gout attack.  You still have symptoms of a gout attack after10 days of treatment.  You have problems (side effects) because of your medicines.  You have chills or a fever.  You have burning pain when you pee (urinate).  You have pain in your lower back or belly. Get help right away if:  You have very bad pain.  Your pain cannot be controlled.  You cannot pee. This information is not intended to replace advice given to you by your health care provider. Make sure you discuss any questions you have with your health care provider. Document Released: 02/13/2008 Document Revised: 10/12/2015 Document Reviewed: 02/16/2015 Elsevier Interactive Patient Education  [employer].

## 2017-10-19 NOTE — Progress Notes (Signed)
Discharge instructions given and explained to patient, she verbalized understanding. Patient in no distress, skin intact, no wound noted, swelling on the left wrist that patient was admitted with, per patient pain/swelling has gotten better. Waiting on daughter for transportation to home.

## 2017-10-19 NOTE — Discharge Summary (Signed)
Physician Discharge Summary  [name] ZOX:[id number] DOB: Mar 09, 1946 DOA: 10/14/2017  PCP: Audley Hose, MD  Admit date: 10/14/2017  Discharge date: 10/19/2017  Discharge Diagnoses:  Principal Problem:   Left wrist pain Active Problems:   Anemia   Sickle cell disease (HCC)   Gout   Left arm pain   Sickle cell pain crisis ([place])  Discharge Condition: Stable  Disposition:  Follow-up Information    Bakare, Mobolaji B, MD. Go in 3 day(s).   Specialty:  Internal Medicine Contact information: [place] [address] [phone]          Pt is discharged home in good condition and is to follow up with Latanya Presser B, MD this week to have labs evaluated. Charelle Petrakis is instructed to increase activity slowly and balance with rest for the next few days, and use prescribed medication to complete treatment of pain  Diet: Regular Wt Readings from Last 3 Encounters:  10/19/17 63.6 kg (140 lb 3.4 oz)  12/24/16 66.2 kg (146 lb)  09/30/16 68 kg (150 lb)   History of present illness:  Chelsea Hensley is a 72 y.o. female with medical history significant for sickle cell disease, Gout, who presented to the ED with complaints of left upper arm pain and swelling of ~10 days duration. Pain is mostly in her left wrist. Patient presented to the ED 10/08/17, CBC, CMP was done-unremarkable, patient was given a course of Keflex for cellulitis and discharged home. Patient endorses compliance 4 times daily of Keflex, but symptoms worsened. She denies fever or chills , nausea vomiting. Patient denies falls, no personal or family history hx of blood clots, no procedures to extremity. Patient reports a history of gout but normally affects her lower extremity- toes.  She has not had a gout flare in several years.  Patient sickle cell pain normally is in her back chest extremities, which today is not worse.  Patient also reported chronic shortness of breath with exertion. But she  is not exactly able to clarify to me if this has worsened recently, but she reported this to EDP.  Patient has just received IV Dilaudid, and is drowsy. She states she was prescribed home O2, for SOB.  Reports in the past she usually needs home O2- 2L PRN only when her blood counts are low.  She endorses very occasional NSAID use.  Denies dark stools or abdominal pain.  ED Course: O2 sats 88% on room air improved to >96% on [place].  Hemoglobin- 6.1, last check 10 days ago- 7.4, with good absolute reticulocyte counts. WBC- 7.2.  Cr elevated 1.64. EKG no ST or T wave. Chest x-ray-cardiomegaly with  Abnormalities, tachycardia, perihilar vascular congestion without overt pulmonary edema, right greater than left bibasilar opacities-atelectasis and or chronic scarring.    Hospital Course:  Patient was admitted for left wrist swelling and pain likely from gout and/or infarct with sickle cell pain crisis and managed appropriately with IVF, IV Dilaudid, IV Toradol contraindicated due to acute renal failure, as well as other adjunct therapies per sickle cell pain management protocols. Acute Gouty Arthritis was managed with IV solumedrol and prednisone. Hb was down to a nadir of 6.1 as against her baseline of close to 9. She got transfused with one unit of PRBC. Hb improved initially to 7, and at the time of discharge, Hb was 9.0. Swelling in left wrist slowly improved, pain got better and controlled down to her baseline, although her left wrist still  painful more than other joints but definitely better than on admission. She requested to be discharged today in her hemodynamically stable condition, she verbalized appreciation for her great care while on admission. She will follow up with her PCP in 3 days for labs and for medication refills. She was discharged home with a prescription for Oxycodone (increased to 10 mg) #30 tablets, to be take Q4-6Hr prn, as well as tapered dose of prednisone for 7 days. Advised to avoid  NSAID and other nephrotoxins.  Discharge Exam: Vitals:   10/18/17 2150 10/19/17 0414  BP: (!) 138/57 (!) 137/59  Pulse: 79 78  Resp: 18 18  Temp: 98.6 F (37 C) 99.1 F (37.3 C)  SpO2: 98% 99%   Vitals:   10/18/17 1410 10/18/17 2150 10/19/17 0414 10/19/17 0500  BP: 135/62 (!) 138/57 (!) 137/59   Pulse: 84 79 78   Resp: $Remo'20 18 18   'hcvBg$ Temp: 99 F (37.2 C) 98.6 F (37 C) 99.1 F (37.3 C)   TempSrc: Oral Oral Oral   SpO2: 90% 98% 99%   Weight:    63.6 kg (140 lb 3.4 oz)  Height:       General appearance : Awake, alert, not in any distress. Speech Clear. Not toxic looking HEENT: Atraumatic and Normocephalic, pupils equally reactive to light and accomodation Neck: Supple, no JVD. No cervical lymphadenopathy.  Chest: Good air entry bilaterally, no added sounds  CVS: S1 S2 regular, no murmurs.  Abdomen: Bowel sounds present, Non tender and not distended with no gaurding, rigidity or rebound. Extremities: B/L Lower Ext shows no edema, both legs are warm to touch Neurology: Awake alert, and oriented X 3, CN II-XII intact, Non focal Skin: No Rash  Discharge Instructions  Discharge Instructions    Diet - low sodium heart healthy   Complete by:  As directed    Increase activity slowly   Complete by:  As directed      Allergies as of 10/19/2017      Reactions   Quinolones Anaphylaxis   All vitals and pressures dropped, blacked out and had seizures      Medication List    STOP taking these medications   cephALEXin 500 MG capsule Commonly known as:  KEFLEX   oxyCODONE-acetaminophen 5-325 MG tablet Commonly known as:  PERCOCET/ROXICET     TAKE these medications   folic acid 1 MG tablet Commonly known as:  FOLVITE Take 1 tablet (1 mg total) by mouth daily.   furosemide 20 MG tablet Commonly known as:  LASIX Take 40 mg by mouth daily as needed for fluid.   multivitamin with minerals Tabs tablet Take 1 tablet by mouth daily.   oxyCODONE 80 mg 12 hr tablet Commonly  known as:  OXYCONTIN Take 1 tablet (80 mg total) by mouth every 12 (twelve) hours as needed (for severe pain). What changed:  Another medication with the same name was changed. Make sure you understand how and when to take each.   Oxycodone HCl 10 MG Tabs Take 1 tablet (10 mg total) by mouth every 4 (four) hours as needed for severe pain. What changed:    medication strength  how much to take   predniSONE 20 MG tablet Commonly known as:  DELTASONE Take 3 tablets daily for 2 days, then 2 tablets daily x 2 days then 1 tab daily for 3 days       The results of significant diagnostics from this hospitalization (including imaging, microbiology, ancillary and laboratory) are  listed below for reference.    Significant Diagnostic Studies: Dg Chest 2 View  Result Date: 10/14/2017 CLINICAL DATA:  Initial evaluation for acute cough, shortness of breath. Sickle cell crisis. EXAM: CHEST - 2 VIEW COMPARISON:  Prior radiograph from 10/08/2017. FINDINGS: Moderate cardiomegaly, stable from previous. Mediastinal silhouette normal. Aortic atherosclerosis. Lungs mildly hypoinflated. Perihilar vascular congestion without overt pulmonary edema. Patchy and linear bibasilar opacities, right greater than left, similar to previous exams, likely atelectasis and/or chronic scarring. No other focal infiltrates. No pleural effusion. No pneumothorax. No acute osseous abnormality. IMPRESSION: 1. Cardiomegaly with perihilar vascular congestion without overt pulmonary edema. 2. Patchy and linear bibasilar opacities, right greater than left, similar to previous exams, and likely reflecting atelectasis and/or chronic scarring. 3. Aortic atherosclerosis. Electronically Signed   By: Jeannine Boga M.D.   On: 10/14/2017 18:16   Dg Chest 2 View  Result Date: 10/08/2017 CLINICAL DATA:  Left arm pain, swelling, shortness of breath EXAM: CHEST - 2 VIEW COMPARISON:  09/05/2016 FINDINGS: Cardiomegaly, vascular congestion.  Linear atelectasis or scarring in the lung bases no effusions. No acute bony abnormality. IMPRESSION: Cardiomegaly with vascular congestion. Bibasilar scarring or atelectasis, stable. Electronically Signed   By: Rolm Baptise M.D.   On: 10/08/2017 12:45   Dg Wrist Complete Left  Result Date: 10/14/2017 CLINICAL DATA:  Initial evaluation for acute swelling, no trauma. EXAM: LEFT WRIST - COMPLETE 3+ VIEW COMPARISON:  None. FINDINGS: Fracture involving the waist of the scaphoid with 5 mm of distraction, favored to be chronic. No other acute fracture dislocation. Bones are diffusely osteopenic. Scattered osteoarthritic changes noted about the proximal left hand. Diffuse soft tissue swelling. IMPRESSION: 1. Fracture involving the scaphoid waist with 5 mm of distraction, favored to be chronic in nature. Correlation with physical exam recommended. 2. No other acute osseous abnormality about the wrist. 3. Diffuse osteopenia. Electronically Signed   By: Jeannine Boga M.D.   On: 10/14/2017 18:22   Dg Hand Complete Left  Result Date: 10/14/2017 CLINICAL DATA:  Initial evaluation for acute swelling, no trauma. EXAM: LEFT HAND - COMPLETE 3+ VIEW COMPARISON:  None. FINDINGS: Fracture extending through the waist of the scaphoid noted with 5 mm of distraction, favored to be chronic in nature. Bones are diffusely osteopenia no other acute fracture or dislocation. Mild scattered osteoarthritic changes present about the hand, most notable at the first [facility] joint. Mild diffuse soft tissue swelling. IMPRESSION: 1. Fracture involving the scaphoid waist with 5 mm of distraction, favored to be chronic in nature. Correlation with physical exam recommended. 2. No other acute osseous abnormality about the left hand. 3. Diffuse osteopenia. Electronically Signed   By: Jeannine Boga M.D.   On: 10/14/2017 18:20    Microbiology: No results found for this or any previous visit (from the past 240 hour(s)).   Labs: Basic  Metabolic Panel: Recent Labs  Lab 10/14/17 1658 10/15/17 0302 10/19/17 0424  NA 140 140 139  K 4.5 5.6* 4.3  CL 110 112* 109  CO2 21* 20* 24  GLUCOSE 100* 134* 111*  BUN 23* 23* 27*  CREATININE 1.64* 1.54* 1.12*  CALCIUM 9.2 9.1 9.8   Liver Function Tests: Recent Labs  Lab 10/14/17 1658 10/19/17 0424  AST 17 14*  ALT 14 11*  ALKPHOS 109 77  BILITOT 1.6* 2.3*  PROT 6.5 5.5*  ALBUMIN 3.9 3.6   No results for input(s): LIPASE, AMYLASE in the last 168 hours. No results for input(s): AMMONIA in the last 168 hours. CBC:  Recent Labs  Lab 10/14/17 1658 10/15/17 0302 10/15/17 1346 10/15/17 2345 10/19/17 0424  WBC 7.2 7.7  --   --  8.2  NEUTROABS 5.1  --   --   --  3.8  HGB 6.1* 5.7* 7.0* 9.1* 9.0*  HCT 19.1* 17.2* 21.2* 27.6* 28.7*  MCV 100.0 98.9  --   --  98.0  PLT 433* 379  --   --  374   Cardiac Enzymes: Recent Labs  Lab 10/14/17 2119 10/15/17 0302 10/15/17 1015  TROPONINI <0.03 <0.03 <0.03   BNP: Invalid input(s): POCBNP CBG: No results for input(s): GLUCAP in the last 168 hours.  Time coordinating discharge: 50 minutes  Signed:  [facility] 10/19/2017, 12:21 PM

## 2017-10-19 NOTE — Progress Notes (Signed)
Patient accompanied home by daughter, transported to the car via wheelchair. Patient in no distress.

## 2017-12-02 ENCOUNTER — Other Ambulatory Visit: Payer: Self-pay | Admitting: Internal Medicine

## 2017-12-02 DIAGNOSIS — N183 Chronic kidney disease, stage 3 unspecified: Secondary | ICD-10-CM

## 2017-12-24 ENCOUNTER — Ambulatory Visit (HOSPITAL_COMMUNITY)
Admission: RE | Admit: 2017-12-24 | Discharge: 2017-12-24 | Disposition: A | Discharge status: Home / Self Care | Payer: MEDICARE | Source: Ambulatory Visit | Attending: Internal Medicine | Admitting: Internal Medicine

## 2017-12-24 DIAGNOSIS — Z881 Allergy status to other antibiotic agents status: Secondary | ICD-10-CM | POA: Insufficient documentation

## 2017-12-24 DIAGNOSIS — D571 Sickle-cell disease without crisis: Secondary | ICD-10-CM | POA: Diagnosis not present

## 2017-12-24 LAB — RENAL FUNCTION PANEL
ALBUMIN: 3.8 g/dL (ref 3.5–5.0)
ANION GAP: 8 (ref 5–15)
BUN: 23 mg/dL (ref 8–23)
CO2: 23 mmol/L (ref 22–32)
Calcium: 10.2 mg/dL (ref 8.9–10.3)
Chloride: 110 mmol/L (ref 98–111)
Creatinine, Ser: 0.92 mg/dL (ref 0.44–1.00)
GFR calc Af Amer: >60 mL/min (ref >60)
GFR calc non Af Amer: >60 mL/min (ref >60)
GLUCOSE: 128 mg/dL — AB (ref 70–99)
POTASSIUM: 4.6 mmol/L (ref 3.5–5.1)
Phosphorus: 3.4 mg/dL (ref 2.5–4.6)
SODIUM: 141 mmol/L (ref 135–145)

## 2017-12-24 LAB — HEPATIC FUNCTION PANEL
ALK PHOS: 141 U/L — AB (ref 38–126)
ALT: 26 U/L (ref 0–44)
AST: 36 U/L (ref 15–41)
Albumin: 3.8 g/dL (ref 3.5–5.0)
BILIRUBIN DIRECT: 0.7 mg/dL — AB (ref 0.0–0.2)
BILIRUBIN INDIRECT: 2.7 mg/dL — AB (ref 0.3–0.9)
Total Bilirubin: 3.4 mg/dL — ABNORMAL HIGH (ref 0.3–1.2)
Total Protein: 6.3 g/dL — ABNORMAL LOW (ref 6.5–8.1)

## 2017-12-24 LAB — CBC WITH DIFFERENTIAL/PLATELET
BASOS ABS: 0 10*3/uL (ref 0.0–0.1)
BASOS PCT: 0 %
EOS PCT: 4 %
Eosinophils Absolute: 0.4 10*3/uL (ref 0.0–0.7)
HEMATOCRIT: 22.7 % — AB (ref 36.0–46.0)
Hemoglobin: 8.1 g/dL — ABNORMAL LOW (ref 12.0–15.0)
Lymphocytes Relative: 55 %
Lymphs Abs: 5.5 10*3/uL (ref 0.7–4.0)
MCH: 34.8 pg — AB (ref 26.0–34.0)
MCHC: 35.7 g/dL (ref 30.0–36.0)
MCV: 97.4 fL (ref 78.0–100.0)
MONO ABS: 1 10*3/uL (ref 0.1–1.0)
MONOS PCT: 10 %
Neutro Abs: 3.2 10*3/uL (ref 1.7–7.7)
Neutrophils Relative %: 31 %
Platelets: 287 10*3/uL (ref 150–400)
RBC: 2.33 MIL/uL — ABNORMAL LOW (ref 3.87–5.11)
RDW: 27.9 % — AB (ref 11.5–15.5)
WBC: 10 10*3/uL (ref 4.0–10.5)

## 2017-12-24 LAB — URIC ACID: Uric Acid, Serum: 7.6 mg/dL — ABNORMAL HIGH (ref 2.5–7.1)

## 2017-12-24 LAB — LACTATE DEHYDROGENASE: LDH: 513 U/L — AB (ref 98–192)

## 2017-12-24 LAB — PREPARE RBC (CROSSMATCH)

## 2017-12-24 LAB — RETICULOCYTES
RBC.: 2.33 MIL/uL — ABNORMAL LOW (ref 3.87–5.11)
Retic Ct Pct: >23 % — ABNORMAL HIGH (ref 0.4–3.1)

## 2017-12-24 LAB — FERRITIN: FERRITIN: 1210 ng/mL — AB (ref 11–307)

## 2017-12-24 NOTE — Progress Notes (Signed)
Pt arrived for lab draw and type and screen for blood transfusion scheduled for 8/8. No complications noted.  Ordering Provider: Dr. Maia Petties, MD Associated diagnosis: Sickle cell anemia, symptomatic

## 2017-12-25 ENCOUNTER — Ambulatory Visit (HOSPITAL_COMMUNITY): Payer: MEDICARE

## 2017-12-26 ENCOUNTER — Ambulatory Visit (HOSPITAL_COMMUNITY)
Admission: RE | Admit: 2017-12-26 | Discharge: 2017-12-26 | Disposition: A | Discharge status: Home / Self Care | Payer: MEDICARE | Source: Ambulatory Visit | Attending: Internal Medicine | Admitting: Internal Medicine

## 2017-12-26 DIAGNOSIS — D571 Sickle-cell disease without crisis: Secondary | ICD-10-CM | POA: Diagnosis not present

## 2017-12-26 LAB — PREPARE RBC (CROSSMATCH)

## 2017-12-26 MED ORDER — ACETAMINOPHEN 325 MG PO TABS
650.0000 mg | ORAL_TABLET | Freq: Once | ORAL | Status: AC
  Administered 2017-12-26: 650 mg via ORAL
  Filled 2017-12-26: qty 2

## 2017-12-26 MED ORDER — SODIUM CHLORIDE 0.9% IV SOLUTION: Freq: Once | INTRAVENOUS | Status: DC

## 2017-12-26 MED ORDER — DIPHENHYDRAMINE HCL 25 MG PO CAPS
50.0000 mg | ORAL_CAPSULE | Freq: Once | ORAL | Status: AC
  Administered 2017-12-26: 50 mg via ORAL
  Filled 2017-12-26: qty 2

## 2017-12-26 NOTE — Progress Notes (Signed)
PATIENT CARE CENTER NOTE     Provider: Leretha Pol MD   Procedure: 1 unit of packed RBC   Note:Patient received 1 unit of packed RBC via PIV. Pre transfusions medications 50 mg Benadryl and 650 mg of Tylenol were administered. Tolerated well, vitals stable, discharge instructions given, verbalized understanding. Patient alert, oriented and ambulatory at the time of discharge.

## 2017-12-26 NOTE — Discharge Instructions (Signed)

## 2017-12-28 LAB — TYPE AND SCREEN
ABO/RH(D): B POS
Antibody Screen: NEGATIVE
UNIT DIVISION: 0
UNIT DIVISION: 0

## 2017-12-28 LAB — BPAM RBC
Blood Product Expiration Date: 201909012359
Blood Product Expiration Date: 201909072359
ISSUE DATE / TIME: 201908091001
UNIT TYPE AND RH: 5100
UNIT TYPE AND RH: 7300

## 2018-02-04 ENCOUNTER — Ambulatory Visit
Admission: RE | Admit: 2018-02-04 | Discharge: 2018-02-04 | Disposition: A | Discharge status: Home / Self Care | Payer: MEDICARE | Source: Ambulatory Visit | Attending: Internal Medicine | Admitting: Internal Medicine

## 2018-02-04 DIAGNOSIS — N183 Chronic kidney disease, stage 3 unspecified: Secondary | ICD-10-CM

## 2018-02-16 ENCOUNTER — Ambulatory Visit (HOSPITAL_COMMUNITY)
Admission: RE | Admit: 2018-02-16 | Discharge: 2018-02-16 | Disposition: A | Discharge status: Home / Self Care | Payer: MEDICARE | Source: Ambulatory Visit | Attending: Internal Medicine | Admitting: Internal Medicine

## 2018-02-16 DIAGNOSIS — D571 Sickle-cell disease without crisis: Secondary | ICD-10-CM | POA: Diagnosis present

## 2018-02-16 NOTE — Progress Notes (Signed)
Patient had Type and Cross drawn. Tolerated lab draw well. Patient to come back tomorrow for 2 units PRBC's per Dr. Caroline More order. Patient alert, oriented and ambulatory at discharge.

## 2018-02-17 ENCOUNTER — Ambulatory Visit (HOSPITAL_COMMUNITY)
Admission: RE | Admit: 2018-02-17 | Discharge: 2018-02-17 | Disposition: A | Discharge status: Home / Self Care | Payer: MEDICARE | Source: Ambulatory Visit | Attending: Internal Medicine | Admitting: Internal Medicine

## 2018-02-17 DIAGNOSIS — D571 Sickle-cell disease without crisis: Secondary | ICD-10-CM | POA: Insufficient documentation

## 2018-02-17 LAB — PREPARE RBC (CROSSMATCH)

## 2018-02-17 MED ORDER — DIPHENHYDRAMINE HCL 25 MG PO CAPS
50.0000 mg | ORAL_CAPSULE | Freq: Once | ORAL | Status: AC
  Administered 2018-02-17: 50 mg via ORAL
  Filled 2018-02-17: qty 2

## 2018-02-17 MED ORDER — SODIUM CHLORIDE 0.9% IV SOLUTION: Freq: Once | INTRAVENOUS | Status: DC

## 2018-02-17 MED ORDER — ACETAMINOPHEN 325 MG PO TABS
650.0000 mg | ORAL_TABLET | Freq: Once | ORAL | Status: AC
  Administered 2018-02-17: 650 mg via ORAL
  Filled 2018-02-17: qty 2

## 2018-02-17 MED ORDER — FUROSEMIDE 40 MG PO TABS
40.0000 mg | ORAL_TABLET | Freq: Once | ORAL | Status: AC
  Administered 2018-02-17: 40 mg via ORAL
  Filled 2018-02-17: qty 1

## 2018-02-17 NOTE — Discharge Instructions (Signed)

## 2018-02-17 NOTE — Progress Notes (Signed)
PATIENT CARE CENTER NOTE  Diagnosis: Sickle Cell Anemia- Symptomatic    Provider: Dr. Maia Petties   Procedure: 2 units PRBC's   Note: Patient received 2 units of blood. Pre-medications given and Lasix given between units per order. Patient tolerated transfusion well with no adverse reaction. Discharge instructions given to patient. Vital signs stable. Patient alert, oriented and ambulatory at discharge.

## 2018-02-18 LAB — TYPE AND SCREEN
ABO/RH(D): B POS
Antibody Screen: NEGATIVE
UNIT DIVISION: 0
Unit division: 0

## 2018-02-18 LAB — BPAM RBC
BLOOD PRODUCT EXPIRATION DATE: 201911012359
Blood Product Expiration Date: 201911012359
ISSUE DATE / TIME: 201910010852
ISSUE DATE / TIME: 201910010852
UNIT TYPE AND RH: 5100
Unit Type and Rh: 5100

## 2018-04-06 ENCOUNTER — Ambulatory Visit (HOSPITAL_COMMUNITY)
Admission: RE | Admit: 2018-04-06 | Discharge: 2018-04-06 | Disposition: A | Discharge status: Home / Self Care | Payer: MEDICARE | Source: Ambulatory Visit | Attending: Internal Medicine | Admitting: Internal Medicine

## 2018-04-06 DIAGNOSIS — D571 Sickle-cell disease without crisis: Secondary | ICD-10-CM | POA: Diagnosis not present

## 2018-04-06 NOTE — Discharge Instructions (Signed)
Type and screen done for blood transfusion tomorrow.

## 2018-04-06 NOTE — Progress Notes (Signed)
Lab draw for a type and screen was done. Blood transfusion tomorrow. Patient on home oxygen. Complained of shortness of breath. Was transported in a wheelchair to the day hospital. The oxygen tube was observed to be kinked. Upon un-kinking of the tube, patient's shortness of breath improved. She felt "better". The vitals were stable. After the lab draw patient, went home. Told to go the ER if her condition worsens. Verbalized understanding. Alert and oriented on discharge. Will be back tomorrow.

## 2018-04-07 ENCOUNTER — Ambulatory Visit (HOSPITAL_COMMUNITY)
Admission: RE | Admit: 2018-04-07 | Discharge: 2018-04-07 | Disposition: A | Discharge status: Home / Self Care | Payer: MEDICARE | Source: Ambulatory Visit | Attending: Internal Medicine | Admitting: Internal Medicine

## 2018-04-07 DIAGNOSIS — D571 Sickle-cell disease without crisis: Secondary | ICD-10-CM | POA: Diagnosis not present

## 2018-04-07 LAB — PREPARE RBC (CROSSMATCH)

## 2018-04-07 MED ORDER — DIPHENHYDRAMINE HCL 25 MG PO CAPS
50.0000 mg | ORAL_CAPSULE | Freq: Once | ORAL | Status: AC
  Administered 2018-04-07: 50 mg via ORAL
  Filled 2018-04-07: qty 2

## 2018-04-07 MED ORDER — FUROSEMIDE 10 MG/ML IJ SOLN
20.0000 mg | Freq: Once | INTRAMUSCULAR | Status: AC
  Administered 2018-04-07: 20 mg via INTRAVENOUS
  Filled 2018-04-07: qty 2

## 2018-04-07 MED ORDER — SODIUM CHLORIDE 0.9% IV SOLUTION
Freq: Once | INTRAVENOUS | Status: AC
  Administered 2018-04-07: 09:00:00 via INTRAVENOUS

## 2018-04-07 MED ORDER — ACETAMINOPHEN 325 MG PO TABS
650.0000 mg | ORAL_TABLET | Freq: Once | ORAL | Status: AC
  Administered 2018-04-07: 650 mg via ORAL
  Filled 2018-04-07: qty 2

## 2018-04-07 NOTE — Progress Notes (Signed)
PATIENT CARE CENTER NOTE  Diagnosis: Sickle cell anemia- symptomatic    Provider: Dr. Maia Petties   Procedure: 2 units PRBC's   Note: Patient received 2 units of blood. Pre-medications given per order. Patient noted to have labored breathing. Patient reported that she has had SOB and labored breathing since Sunday. Lung sound clear. Oxygen saturation 98% on 2 liter [place]. Dr. Maia Petties notified and gave order for 20 mg IV Lasix post-transfusion. At completion of blood, patient's breathing was more relaxed and patient reported feeling like she was close to her baseline.  Lasix given. Vital signs monitored and stable. Dr. Maia Petties called and updated on patient's improvement post-infusion and Lasix. Bakare spoke with patient over the phone. Patient advised to call Bakare's office  for any changes in conditon and to report to the Emergency room if breathing worsens. Patient expresses an understanding. Patient alert, oriented and ambulatory to wheelchair at discharge. Patient discharged home with her daughter.

## 2018-04-07 NOTE — Discharge Instructions (Signed)

## 2018-04-08 LAB — TYPE AND SCREEN
ABO/RH(D): B POS
ANTIBODY SCREEN: NEGATIVE
Unit division: 0
Unit division: 0

## 2018-04-08 LAB — BPAM RBC
BLOOD PRODUCT EXPIRATION DATE: 201912202359
BLOOD PRODUCT EXPIRATION DATE: 201912212359
ISSUE DATE / TIME: 201911190822
ISSUE DATE / TIME: 201911190822
Unit Type and Rh: 5100
Unit Type and Rh: 5100

## 2018-05-25 ENCOUNTER — Other Ambulatory Visit: Payer: Self-pay

## 2018-05-25 ENCOUNTER — Encounter (HOSPITAL_COMMUNITY): Payer: Self-pay

## 2018-05-25 ENCOUNTER — Inpatient Hospital Stay (HOSPITAL_COMMUNITY)
Admission: EM | Admit: 2018-05-25 | Discharge: 2018-05-30 | DRG: 811 | Disposition: A | Discharge status: Home / Self Care | Payer: MEDICARE | Attending: Internal Medicine | Admitting: Internal Medicine

## 2018-05-25 DIAGNOSIS — M109 Gout, unspecified: Secondary | ICD-10-CM | POA: Diagnosis present

## 2018-05-25 DIAGNOSIS — D631 Anemia in chronic kidney disease: Secondary | ICD-10-CM | POA: Diagnosis present

## 2018-05-25 DIAGNOSIS — J9621 Acute and chronic respiratory failure with hypoxia: Secondary | ICD-10-CM | POA: Diagnosis present

## 2018-05-25 DIAGNOSIS — H0520 Unspecified exophthalmos: Secondary | ICD-10-CM | POA: Diagnosis present

## 2018-05-25 DIAGNOSIS — Z79899 Other long term (current) drug therapy: Secondary | ICD-10-CM

## 2018-05-25 DIAGNOSIS — Z79891 Long term (current) use of opiate analgesic: Secondary | ICD-10-CM

## 2018-05-25 DIAGNOSIS — D564 Hereditary persistence of fetal hemoglobin [HPFH]: Secondary | ICD-10-CM | POA: Diagnosis present

## 2018-05-25 DIAGNOSIS — H3500 Unspecified background retinopathy: Secondary | ICD-10-CM | POA: Diagnosis present

## 2018-05-25 DIAGNOSIS — G894 Chronic pain syndrome: Secondary | ICD-10-CM | POA: Diagnosis present

## 2018-05-25 DIAGNOSIS — D5700 Hb-SS disease with crisis, unspecified: Secondary | ICD-10-CM | POA: Diagnosis not present

## 2018-05-25 DIAGNOSIS — Z87891 Personal history of nicotine dependence: Secondary | ICD-10-CM

## 2018-05-25 DIAGNOSIS — N189 Chronic kidney disease, unspecified: Secondary | ICD-10-CM

## 2018-05-25 DIAGNOSIS — I272 Pulmonary hypertension, unspecified: Secondary | ICD-10-CM | POA: Diagnosis present

## 2018-05-25 DIAGNOSIS — Z9049 Acquired absence of other specified parts of digestive tract: Secondary | ICD-10-CM

## 2018-05-25 DIAGNOSIS — R410 Disorientation, unspecified: Secondary | ICD-10-CM

## 2018-05-25 DIAGNOSIS — Z888 Allergy status to other drugs, medicaments and biological substances status: Secondary | ICD-10-CM

## 2018-05-25 DIAGNOSIS — Z90710 Acquired absence of both cervix and uterus: Secondary | ICD-10-CM

## 2018-05-25 DIAGNOSIS — M8580 Other specified disorders of bone density and structure, unspecified site: Secondary | ICD-10-CM | POA: Diagnosis present

## 2018-05-25 DIAGNOSIS — N183 Chronic kidney disease, stage 3 (moderate): Secondary | ICD-10-CM | POA: Diagnosis present

## 2018-05-25 LAB — COMPREHENSIVE METABOLIC PANEL
ALBUMIN: 4 g/dL (ref 3.5–5.0)
ALT: 29 U/L (ref 0–44)
ANION GAP: 7 (ref 5–15)
AST: 54 U/L — ABNORMAL HIGH (ref 15–41)
Alkaline Phosphatase: 120 U/L (ref 38–126)
BILIRUBIN TOTAL: 4.2 mg/dL — AB (ref 0.3–1.2)
BUN: 18 mg/dL (ref 8–23)
CALCIUM: 10 mg/dL (ref 8.9–10.3)
CO2: 19 mmol/L — AB (ref 22–32)
Chloride: 117 mmol/L — ABNORMAL HIGH (ref 98–111)
Creatinine, Ser: 1.24 mg/dL — ABNORMAL HIGH (ref 0.44–1.00)
GFR calc Af Amer: 50 mL/min — ABNORMAL LOW (ref >60)
GFR calc non Af Amer: 43 mL/min — ABNORMAL LOW (ref >60)
GLUCOSE: 96 mg/dL (ref 70–99)
Potassium: 4.1 mmol/L (ref 3.5–5.1)
SODIUM: 143 mmol/L (ref 135–145)
TOTAL PROTEIN: 6.3 g/dL — AB (ref 6.5–8.1)

## 2018-05-25 LAB — CBC WITH DIFFERENTIAL/PLATELET
Abs Immature Granulocytes: 0.07 10*3/uL (ref 0.00–0.07)
BASOS PCT: 1 %
Basophils Absolute: 0.1 10*3/uL (ref 0.0–0.1)
EOS ABS: 0.4 10*3/uL (ref 0.0–0.5)
Eosinophils Relative: 4 %
HEMATOCRIT: 21.5 % — AB (ref 36.0–46.0)
HEMOGLOBIN: 7 g/dL — AB (ref 12.0–15.0)
Immature Granulocytes: 1 %
LYMPHS ABS: 3.4 10*3/uL (ref 0.7–4.0)
Lymphocytes Relative: 34 %
MCH: 31.7 pg (ref 26.0–34.0)
MCHC: 32.6 g/dL (ref 30.0–36.0)
MCV: 97.3 fL (ref 80.0–100.0)
MONO ABS: 1 10*3/uL (ref 0.1–1.0)
MONOS PCT: 10 %
NEUTROS PCT: 50 %
Neutro Abs: 5.2 10*3/uL (ref 1.7–7.7)
Platelets: 268 10*3/uL (ref 150–400)
RBC: 2.21 MIL/uL — ABNORMAL LOW (ref 3.87–5.11)
RDW: 29.8 % — AB (ref 11.5–15.5)
WBC: 10.1 10*3/uL (ref 4.0–10.5)
nRBC: 27.3 % — ABNORMAL HIGH (ref 0.0–0.2)

## 2018-05-25 LAB — RETICULOCYTES
IMMATURE RETIC FRACT: 27.1 % — AB (ref 2.3–15.9)
RBC.: 2.21 MIL/uL — AB (ref 3.87–5.11)
RETIC COUNT ABSOLUTE: 648.4 10*3/uL — AB (ref 19.0–186.0)
Retic Ct Pct: 30 % — ABNORMAL HIGH (ref 0.4–3.1)

## 2018-05-25 MED ORDER — HYDROMORPHONE HCL 1 MG/ML IJ SOLN: 1.0000 mg | INTRAMUSCULAR | Status: AC

## 2018-05-25 MED ORDER — HYDROMORPHONE HCL 1 MG/ML IJ SOLN: 0.5000 mg | INTRAMUSCULAR | Status: AC

## 2018-05-25 MED ORDER — DEXTROSE-NACL 5-0.45 % IV SOLN
INTRAVENOUS | Status: DC
  Administered 2018-05-25: 23:00:00 via INTRAVENOUS

## 2018-05-25 MED ORDER — HYDROMORPHONE HCL 1 MG/ML IJ SOLN
1.0000 mg | INTRAMUSCULAR | Status: AC
  Administered 2018-05-26 (×2): 1 mg via INTRAVENOUS
  Filled 2018-05-25 (×2): qty 1

## 2018-05-25 MED ORDER — HYDROMORPHONE HCL 1 MG/ML IJ SOLN
0.5000 mg | INTRAMUSCULAR | Status: AC
  Administered 2018-05-25: 0.5 mg via INTRAVENOUS
  Filled 2018-05-25: qty 1

## 2018-05-25 MED ORDER — HYDROMORPHONE HCL 1 MG/ML IJ SOLN
1.0000 mg | INTRAMUSCULAR | Status: AC
  Administered 2018-05-25: 1 mg via INTRAVENOUS
  Filled 2018-05-25: qty 1

## 2018-05-25 MED ORDER — HYDROMORPHONE HCL 1 MG/ML IJ SOLN
0.5000 mg | Freq: Once | INTRAMUSCULAR | Status: AC
  Administered 2018-05-25: 0.5 mg via SUBCUTANEOUS
  Filled 2018-05-25: qty 1

## 2018-05-25 NOTE — ED Triage Notes (Signed)
Pt complains of sickle cell pain in her back and extremities for a few days, worse tonight because her breathing became labored and shallow

## 2018-05-25 NOTE — ED Provider Notes (Addendum)
[facility] DEPT Provider Note   CSN: [id number] Arrival date & time: 05/25/18  1911     History   Chief Complaint No chief complaint on file. Pain  HPI Chelsea Hensley is a 73 y.o. female.  The history is provided by the patient and medical records. No language interpreter was used.  Sickle Cell Pain Crisis  Location:  Back, lower extremity and upper extremity Severity:  Severe Onset quality:  Gradual Duration:  1 week Similar to previous crisis episodes: yes   Timing:  Constant Progression:  Unchanged Chronicity:  Recurrent History of pulmonary emboli: no   Context: not infection   Relieved by:  Nothing Worsened by:  Nothing Ineffective treatments:  Prescription drugs Associated symptoms: shortness of breath   Associated symptoms: no chest pain, no congestion, no cough, no fatigue, no fever, no headaches, no nausea, no swelling of legs, no vomiting and no wheezing   Risk factors: hx of pneumonia   Risk factors: no prior acute chest     Past Medical History:  Diagnosis Date  . Gout   . Hyperuricemia   . Sickle cell anemia (HCC)   . 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 Sickle cell pain crisis ([place])   . Left arm pain 10/15/2017  . Gout 10/14/2017  . Left wrist pain 10/14/2017  . Dyspnea on exertion 12/24/2016  . Anemia 12/02/2015  . Sickle cell disease ([place]) 12/02/2015    Past Surgical History:  Procedure Laterality Date  . ABDOMINAL HYSTERECTOMY    . CATARACT EXTRACTION, BILATERAL    . CHOLECYSTECTOMY    . HERNIA REPAIR    . TOTAL ABDOMINAL HYSTERECTOMY W/ BILATERAL SALPINGOOPHORECTOMY Bilateral      OB History   No obstetric history on file.      Home Medications    Prior to Admission medications   Medication Sig Start Date End Date Taking? Authorizing Provider  allopurinol (ZYLOPRIM) 300 MG tablet Take 300 mg by  mouth daily.   Yes [provider]  Deferasirox (JADENU) 180 MG TABS Take 180 mg by mouth daily.   Yes [provider]  Deferasirox (JADENU) 360 MG TABS Take 720 mg by mouth daily.   Yes [provider]  folic acid (FOLVITE) 1 MG tablet Take 1 tablet (1 mg total) by mouth daily. 01/24/16  Yes Micheline Chapman, NP  furosemide (LASIX) 20 MG tablet Take 40 mg by mouth daily as needed for fluid.    Yes [provider]  Multiple Vitamin (MULTIVITAMIN WITH MINERALS) TABS tablet Take 1 tablet by mouth daily.   Yes [provider]  naloxone (NARCAN) nasal spray 4 mg/0.1 mL Place 0.4 mg into the nose once.  04/02/18  Yes [provider]  oxyCODONE (OXYCONTIN) 80 mg 12 hr tablet Take 1 tablet (80 mg total) by mouth every 12 (twelve) hours as needed (for severe pain). 01/24/16  Yes Micheline Chapman, NP  oxyCODONE-Aspirin [phone] MG TABS Take 2 tablets by mouth 4 (four) times daily as needed for severe pain. 04/03/18  Yes [provider]  oxyCODONE 10 MG TABS Take 1 tablet (10 mg total) by mouth every 4 (four) hours as needed for severe pain. Patient not taking: Reported on 05/25/2018 10/19/17   Tresa Garter, MD  predniSONE (DELTASONE) 20 MG tablet Take 3 tablets daily for 2 days, then 2 tablets daily x  2 days then 1 tab daily for 3 days Patient not taking: Reported on 05/25/2018 10/19/17   Tresa Garter, MD    Family History History reviewed. No pertinent family history.  Social History Social History   Tobacco Use  . Smoking status: Former Smoker    Packs/day: 1.00    Types: Cigarettes    Last attempt to quit: 12/25/2006    Years since quitting: 11.4  . Smokeless tobacco: Never Used  . Tobacco comment: pt states that Chelsea Hensley was in her 75s when Chelsea Hensley started smoking; cannot remember exactly  Substance Use Topics  . Alcohol use: Yes    Comment: rare  . Drug use: No     Allergies   Quinolones   Review of Systems Review of  Systems  Constitutional: Negative for chills, diaphoresis, fatigue and fever.  HENT: Negative for congestion.   Respiratory: Positive for shortness of breath. Negative for cough, chest tightness, wheezing and stridor.   Cardiovascular: Negative for chest pain, palpitations and leg swelling.  Gastrointestinal: Negative for constipation, diarrhea, nausea and vomiting.  Genitourinary: Negative for flank pain.  Musculoskeletal: Positive for back pain. Negative for neck pain and neck stiffness.  Skin: Negative for rash and wound.  Neurological: Negative for weakness, light-headedness and headaches.  Psychiatric/Behavioral: Negative for agitation.  All other systems reviewed and are negative.    Physical Exam Updated Vital Signs BP (!) 144/65 (BP Location: Right Arm)   Pulse 92   Temp 98.7 F (37.1 C) (Oral)   SpO2 94%   Physical Exam Vitals signs and nursing note reviewed.  Constitutional:      General: Chelsea Hensley is not in acute distress.    Appearance: Chelsea Hensley is well-developed. Chelsea Hensley is not ill-appearing, toxic-appearing or diaphoretic.  HENT:     Head: Normocephalic and atraumatic.     Nose: No congestion or rhinorrhea.     Mouth/Throat:     Pharynx: No oropharyngeal exudate.  Eyes:     Conjunctiva/sclera: Conjunctivae normal.  Neck:     Musculoskeletal: Neck supple. No muscular tenderness.  Cardiovascular:     Rate and Rhythm: Normal rate and regular rhythm.     Heart sounds: No murmur.  Pulmonary:     Effort: Pulmonary effort is normal. No respiratory distress.     Breath sounds: Rales (faint) present. No wheezing or rhonchi.  Chest:     Chest wall: No tenderness.  Abdominal:     General: Abdomen is flat.     Palpations: Abdomen is soft.     Tenderness: There is no abdominal tenderness.  Musculoskeletal:        General: Tenderness present.     Right lower leg: No edema.     Left lower leg: No edema.  Skin:    General: Skin is warm and dry.     Capillary Refill: Capillary  refill takes less than 2 seconds.  Neurological:     General: No focal deficit present.     Mental Status: Chelsea Hensley is alert and oriented to person, place, and time.  Psychiatric:        Mood and Affect: Mood normal.      ED Treatments / Results  Labs (all labs ordered are listed, but only abnormal results are displayed) Labs Reviewed  COMPREHENSIVE METABOLIC PANEL - Abnormal; Notable for the following components:      Result Value   Chloride 117 (*)    CO2 19 (*)    Creatinine, Ser 1.24 (*)    Total  Protein 6.3 (*)    AST 54 (*)    Total Bilirubin 4.2 (*)    GFR calc non Af Amer 43 (*)    GFR calc Af Amer 50 (*)    All other components within normal limits  CBC WITH DIFFERENTIAL/PLATELET - Abnormal; Notable for the following components:   RBC 2.21 (*)    Hemoglobin 7.0 (*)    HCT 21.5 (*)    RDW 29.8 (*)    nRBC 27.3 (*)    All other components within normal limits  RETICULOCYTES - Abnormal; Notable for the following components:   Retic Ct Pct 30.0 (*)    RBC. 2.21 (*)    Retic Count, Absolute 648.4 (*)    Immature Retic Fract 27.1 (*)    All other components within normal limits    EKG None  Radiology No results found.  Procedures Procedures (including critical care time)  CRITICAL CARE Performed by: Gwenyth Allegra Tegeler Total critical care time: 35 minutes Critical care time was exclusive of separately billable procedures and treating other patients. Critical care was necessary to treat or prevent imminent or life-threatening deterioration. Critical care was time spent personally by me on the following activities: development of treatment plan with patient and/or surrogate as well as nursing, discussions with consultants, evaluation of patient's response to treatment, examination of patient, obtaining history from patient or surrogate, ordering and performing treatments and interventions, ordering and review of laboratory studies, ordering and review of  radiographic studies, pulse oximetry and re-evaluation of patient's condition.   Medications Ordered in ED Medications  dextrose 5 %-0.45 % sodium chloride infusion ( Intravenous New Bag/Given 05/25/18 2230)  HYDROmorphone (DILAUDID) injection 1 mg (has no administration in time range)    Or  HYDROmorphone (DILAUDID) injection 1 mg (has no administration in time range)  HYDROmorphone (DILAUDID) injection 0.5 mg (0.5 mg Subcutaneous Given 05/25/18 2014)  HYDROmorphone (DILAUDID) injection 0.5 mg (0.5 mg Intravenous Given 05/25/18 2231)    Or  HYDROmorphone (DILAUDID) injection 0.5 mg ( Subcutaneous See Alternative 05/25/18 2231)  HYDROmorphone (DILAUDID) injection 1 mg (1 mg Intravenous Given 05/25/18 2316)    Or  HYDROmorphone (DILAUDID) injection 1 mg ( Subcutaneous See Alternative 05/25/18 2316)  HYDROmorphone (DILAUDID) injection 1 mg (1 mg Intravenous Given 05/25/18 2354)    Or  HYDROmorphone (DILAUDID) injection 1 mg ( Subcutaneous See Alternative 05/25/18 2354)     Initial Impression / Assessment and Plan / ED Course  I have reviewed the triage vital signs and the nursing notes.  Pertinent labs & imaging results that were available during my care of the patient were reviewed by me and considered in my medical decision making (see chart for details).     Moe Brier is a 73 y.o. female with a past medical history significant for sickle cell disease on chronic home oxygen who presents for shortness of breath and pain crisis.  Patient reports that for the last week Chelsea Hensley has been having worsening shortness of breath and pain in her back and extremities.  Chelsea Hensley says this is consistent with prior pain crisis.  Chelsea Hensley reports that Chelsea Hensley has not had a severe crisis in several months and has not had to come to the emergency department since May.  Chelsea Hensley reports that Chelsea Hensley only comes when Chelsea Hensley really needs to.  Chelsea Hensley says that the pain is severe in her back and legs and arms.  Chelsea Hensley has no history of DVT or PE.  Chelsea Hensley reports  no recent fevers,  chills, congestion, or cough.  Chelsea Hensley says Chelsea Hensley is had pneumonia several times in the past and says it does not feel like a pneumonia currently.  Chelsea Hensley denies any chest pain.  Chelsea Hensley reports that Chelsea Hensley does not want a chest x-ray at this time as Chelsea Hensley has a low suspicion for an infectious cause of her symptoms.  Chelsea Hensley denies any nausea, vomiting, constipation, or diarrhea.  Chelsea Hensley denies any abdominal pain or recent traumas.  Chelsea Hensley reports the pain is 20 out of 10 in her back and extremities and Chelsea Hensley does not feel Chelsea Hensley can make it to her appointment on Thursday.  On exam, patient has some diffuse back tenderness but no focal CVA tenderness.  Lungs are somewhat crackly in the bases.  Chest is nontender.  Patient has a systolic murmur.  No focal neurologic deficit on my initial exam.  Legs are nontender and nonswollen in the calves.  We discussed that given patient's age and shortness of breath pneumonia or PE PE would be a consideration however Chelsea Hensley does not want any imaging at this time.  Chelsea Hensley would rather get fluids and pain medication to see if Chelsea Hensley is going to be able to go home.  Anticipate reassessment after labs and medications.  Suspect admission.  11:53 PM Patient was reassessed after several doses of pain medication and Chelsea Hensley still feels that her pain is uncontrolled and Chelsea Hensley needs to be admitted.  Patient still denies any new cough, fevers, or chills.  Chelsea Hensley still does not want chest x-ray.  Patient's labs show slight increase in creatinine.  Hemoglobin is 7.0 which is similar to when Chelsea Hensley has had admission in the past.  We will hold on blood transfusion at this time.  Hospitalist team will be called for admission for further pain management for sickle cell pain crisis.   Final Clinical Impressions(s) / ED Diagnoses   Final diagnoses:  Sickle cell pain crisis [facility])    ED Discharge Orders    None     Clinical Impression: 1. Sickle cell pain crisis ([place])     Disposition: Admit  This note was  prepared with assistance of Dragon voice recognition software. Occasional wrong-word or sound-a-like substitutions may have occurred due to the inherent limitations of voice recognition software.     Tegeler, Gwenyth Allegra, MD 05/26/18 2341    Tegeler, Gwenyth Allegra, MD 06/01/18 1431

## 2018-05-26 ENCOUNTER — Other Ambulatory Visit: Payer: Self-pay

## 2018-05-26 DIAGNOSIS — J9621 Acute and chronic respiratory failure with hypoxia: Secondary | ICD-10-CM

## 2018-05-26 DIAGNOSIS — Z87891 Personal history of nicotine dependence: Secondary | ICD-10-CM | POA: Diagnosis not present

## 2018-05-26 DIAGNOSIS — Z79891 Long term (current) use of opiate analgesic: Secondary | ICD-10-CM | POA: Diagnosis not present

## 2018-05-26 DIAGNOSIS — D564 Hereditary persistence of fetal hemoglobin [HPFH]: Secondary | ICD-10-CM | POA: Diagnosis present

## 2018-05-26 DIAGNOSIS — Z9049 Acquired absence of other specified parts of digestive tract: Secondary | ICD-10-CM | POA: Diagnosis not present

## 2018-05-26 DIAGNOSIS — D631 Anemia in chronic kidney disease: Secondary | ICD-10-CM | POA: Diagnosis present

## 2018-05-26 DIAGNOSIS — N189 Chronic kidney disease, unspecified: Secondary | ICD-10-CM | POA: Diagnosis not present

## 2018-05-26 DIAGNOSIS — M8580 Other specified disorders of bone density and structure, unspecified site: Secondary | ICD-10-CM | POA: Diagnosis present

## 2018-05-26 DIAGNOSIS — R410 Disorientation, unspecified: Secondary | ICD-10-CM | POA: Diagnosis present

## 2018-05-26 DIAGNOSIS — M109 Gout, unspecified: Secondary | ICD-10-CM | POA: Diagnosis present

## 2018-05-26 DIAGNOSIS — Z90710 Acquired absence of both cervix and uterus: Secondary | ICD-10-CM | POA: Diagnosis not present

## 2018-05-26 DIAGNOSIS — I272 Pulmonary hypertension, unspecified: Secondary | ICD-10-CM | POA: Diagnosis present

## 2018-05-26 DIAGNOSIS — G894 Chronic pain syndrome: Secondary | ICD-10-CM | POA: Diagnosis present

## 2018-05-26 DIAGNOSIS — D5700 Hb-SS disease with crisis, unspecified: Secondary | ICD-10-CM | POA: Diagnosis present

## 2018-05-26 DIAGNOSIS — Z888 Allergy status to other drugs, medicaments and biological substances status: Secondary | ICD-10-CM | POA: Diagnosis not present

## 2018-05-26 DIAGNOSIS — H3500 Unspecified background retinopathy: Secondary | ICD-10-CM | POA: Diagnosis present

## 2018-05-26 DIAGNOSIS — Z79899 Other long term (current) drug therapy: Secondary | ICD-10-CM | POA: Diagnosis not present

## 2018-05-26 DIAGNOSIS — H0520 Unspecified exophthalmos: Secondary | ICD-10-CM | POA: Diagnosis present

## 2018-05-26 DIAGNOSIS — N183 Chronic kidney disease, stage 3 (moderate): Secondary | ICD-10-CM | POA: Diagnosis present

## 2018-05-26 LAB — PREPARE RBC (CROSSMATCH)

## 2018-05-26 MED ORDER — SENNOSIDES-DOCUSATE SODIUM 8.6-50 MG PO TABS
1.0000 | ORAL_TABLET | Freq: Two times a day (BID) | ORAL | Status: DC
  Administered 2018-05-26 – 2018-05-29 (×6): 1 via ORAL
  Filled 2018-05-26 (×7): qty 1

## 2018-05-26 MED ORDER — DIPHENHYDRAMINE HCL 25 MG PO CAPS: 25.0000 mg | ORAL_CAPSULE | ORAL | Status: DC | PRN

## 2018-05-26 MED ORDER — ONDANSETRON HCL 4 MG/2ML IJ SOLN: 4.0000 mg | Freq: Four times a day (QID) | INTRAMUSCULAR | Status: DC | PRN

## 2018-05-26 MED ORDER — HYDROMORPHONE 1 MG/ML IV SOLN
INTRAVENOUS | Status: DC
  Administered 2018-05-26: 0 mg via INTRAVENOUS
  Administered 2018-05-26: 1.6 mg via INTRAVENOUS
  Administered 2018-05-26: 30 mg via INTRAVENOUS
  Administered 2018-05-27: 0 mg via INTRAVENOUS
  Administered 2018-05-27: 0.6 mg via INTRAVENOUS
  Administered 2018-05-27: 0 mg via INTRAVENOUS
  Administered 2018-05-27: 0.3 mg via INTRAVENOUS
  Administered 2018-05-28: 0 mg via INTRAVENOUS

## 2018-05-26 MED ORDER — SODIUM CHLORIDE 0.9 % IV SOLN
25.0000 mg | INTRAVENOUS | Status: DC | PRN
  Filled 2018-05-26: qty 0.5

## 2018-05-26 MED ORDER — NALOXONE HCL 0.4 MG/ML IJ SOLN: 0.4000 mg | INTRAMUSCULAR | Status: DC | PRN

## 2018-05-26 MED ORDER — SODIUM CHLORIDE 0.9% FLUSH: 9.0000 mL | INTRAVENOUS | Status: DC | PRN

## 2018-05-26 MED ORDER — DIPHENHYDRAMINE HCL 25 MG PO CAPS
25.0000 mg | ORAL_CAPSULE | Freq: Once | ORAL | Status: AC
  Administered 2018-05-26: 25 mg via ORAL
  Filled 2018-05-26: qty 1

## 2018-05-26 MED ORDER — ACETAMINOPHEN 325 MG PO TABS
650.0000 mg | ORAL_TABLET | Freq: Once | ORAL | Status: AC
  Administered 2018-05-26: 650 mg via ORAL
  Filled 2018-05-26: qty 2

## 2018-05-26 MED ORDER — OXYCODONE HCL ER 20 MG PO T12A
80.0000 mg | EXTENDED_RELEASE_TABLET | Freq: Two times a day (BID) | ORAL | Status: DC
  Administered 2018-05-26 – 2018-05-27 (×3): 80 mg via ORAL
  Filled 2018-05-26 (×4): qty 4

## 2018-05-26 MED ORDER — SODIUM CHLORIDE 0.9% IV SOLUTION: Freq: Once | INTRAVENOUS | Status: DC

## 2018-05-26 MED ORDER — ALLOPURINOL 300 MG PO TABS
300.0000 mg | ORAL_TABLET | Freq: Every day | ORAL | Status: DC
  Administered 2018-05-26 – 2018-05-30 (×4): 300 mg via ORAL
  Filled 2018-05-26 (×4): qty 1

## 2018-05-26 MED ORDER — DIPHENHYDRAMINE HCL 25 MG PO CAPS
25.0000 mg | ORAL_CAPSULE | ORAL | Status: DC | PRN
  Administered 2018-05-26: 25 mg via ORAL
  Filled 2018-05-26: qty 1

## 2018-05-26 MED ORDER — FOLIC ACID 1 MG PO TABS
1.0000 mg | ORAL_TABLET | Freq: Every day | ORAL | Status: DC
  Administered 2018-05-26 – 2018-05-30 (×4): 1 mg via ORAL
  Filled 2018-05-26 (×4): qty 1

## 2018-05-26 MED ORDER — DEFERASIROX 180 MG PO TABS: 180.0000 mg | ORAL_TABLET | Freq: Every day | ORAL | Status: DC

## 2018-05-26 MED ORDER — ONDANSETRON HCL 4 MG/2ML IJ SOLN: 4.0000 mg | INTRAMUSCULAR | Status: DC | PRN

## 2018-05-26 MED ORDER — POLYETHYLENE GLYCOL 3350 17 G PO PACK: 17.0000 g | PACK | Freq: Every day | ORAL | Status: DC | PRN

## 2018-05-26 MED ORDER — SODIUM CHLORIDE 0.9 % IV SOLN: 25.0000 mg | INTRAVENOUS | Status: DC | PRN

## 2018-05-26 MED ORDER — ONDANSETRON HCL 4 MG PO TABS: 4.0000 mg | ORAL_TABLET | ORAL | Status: DC | PRN

## 2018-05-26 MED ORDER — DEXTROSE-NACL 5-0.45 % IV SOLN: INTRAVENOUS | Status: DC

## 2018-05-26 MED ORDER — DEFERASIROX 360 MG PO TABS: 720.0000 mg | ORAL_TABLET | Freq: Every day | ORAL | Status: DC

## 2018-05-26 MED ORDER — ADULT MULTIVITAMIN W/MINERALS CH
1.0000 | ORAL_TABLET | Freq: Every day | ORAL | Status: DC
  Administered 2018-05-26 – 2018-05-29 (×3): 1 via ORAL
  Filled 2018-05-26 (×4): qty 1

## 2018-05-26 MED ORDER — MORPHINE SULFATE 2 MG/ML IV SOLN: INTRAVENOUS | Status: DC

## 2018-05-26 MED ORDER — MORPHINE SULFATE 2 MG/ML IV SOLN
INTRAVENOUS | Status: DC
  Administered 2018-05-26: 07:00:00 via INTRAVENOUS

## 2018-05-26 NOTE — Progress Notes (Signed)
Morphine PCA 22 ml wasted in waste disposal container.  Witnessed by Sharyn Blitz, RN.

## 2018-05-26 NOTE — Progress Notes (Signed)
Subjective: Chelsea Hensley, a 73 year old female with a medical history significant for sickle cell anemia, HbSS, anemia of chronic disease, chronic kidney disease, gouty arthritis, chronic pain syndrome, and osteopenia was admitted in sickle cell crisis. Patient says that she noticed that she was becoming consistently short of breath, which is typically how her pain crisis begin. She says that pain started to increase several days ago, she has not identified any provocative factors related to current crisis. Patient is currently on Morphine PCA, she says that she is typically on dilaudid PCA when hospitalized. Patient is opiate tolerant and typically takes OxyContin 80 mg. She does not take medication consistently, only when she has increased pain. Patient does not have a hematologist and sickle cell is managed in primary care. She says that pain has not been controlled on morphine PCA.. Current pain intensity is 10/10 primarily to upper/lower extremities and low back.  In regard to history of symptomatic anemia, patient's baseline is typically 6.0-7.0. Patient endorses shortness of breath at rest.  Patient states that she has been unable to tolerate low hemoglobin in the past, shortness of breath typically worsens as hemoglobin gets closer to 7. Oxygen saturation is currently 93% on 2 L.   She denies chest pain, heart palpitations, dizziness, dysuria, nausea, vomiting, or diarrhea.   Objective:  Vital signs in last 24 hours:  Vitals:   05/26/18 0400 05/26/18 0443 05/26/18 0451 05/26/18 0641  BP: (!) 143/87 117/72    Pulse: 89 85    Resp: $Remo'13 15  18  'bcdTz$ Temp:  98.1 F (36.7 C)    TempSrc:      SpO2: 93% 97%  95%  Weight:   65 kg   Height:   '5\' 3"'$  (1.6 m)     Intake/Output from previous day:   Intake/Output Summary (Last 24 hours) at 05/26/2018 1124 Last data filed at 05/26/2018 0700 Gross per 24 hour  Intake 806.62 ml  Output 100 ml  Net 706.62 ml   Physical Exam Constitutional:    Appearance: Normal appearance.  HENT:     Mouth/Throat:     Mouth: Mucous membranes are moist.  Eyes:     General:        Right eye: No discharge.        Left eye: No discharge.     Pupils: Pupils are equal, round, and reactive to light.     Comments: Right lid lag Proptosis  Neck:     Musculoskeletal: Normal range of motion and neck supple.  Cardiovascular:     Rate and Rhythm: Normal rate. Rhythm irregular.     Heart sounds: No murmur.  Pulmonary:     Effort: No respiratory distress.     Breath sounds: No rhonchi.  Chest:     Chest wall: No tenderness.  Musculoskeletal: Normal range of motion.     Comments: Bilateral swan-neck deformities  Skin:    General: Skin is warm.  Neurological:     General: No focal deficit present.     Mental Status: She is alert.     Motor: Weakness present.     Gait: Gait normal.  Psychiatric:        Mood and Affect: Mood normal.        Behavior: Behavior normal.        Thought Content: Thought content normal.     Lab Results:  Basic Metabolic Panel:    Component Value Date/Time   NA 143 05/25/2018 2059   K  4.1 05/25/2018 2059   CL 117 (H) 05/25/2018 2059   CO2 19 (L) 05/25/2018 2059   BUN 18 05/25/2018 2059   CREATININE 1.24 (H) 05/25/2018 2059   CREATININE 1.16 (H) 01/12/2016 1007   GLUCOSE 96 05/25/2018 2059   CALCIUM 10.0 05/25/2018 2059   CBC:    Component Value Date/Time   WBC 10.1 05/25/2018 2059   HGB 7.0 (L) 05/25/2018 2059   HCT 21.5 (L) 05/25/2018 2059   PLT 268 05/25/2018 2059   MCV 97.3 05/25/2018 2059   NEUTROABS 5.2 05/25/2018 2059   LYMPHSABS 3.4 05/25/2018 2059   MONOABS 1.0 05/25/2018 2059   EOSABS 0.4 05/25/2018 2059   BASOSABS 0.1 05/25/2018 2059    No results found for this or any previous visit (from the past 240 hour(s)).  Studies/Results: No results found.  Medications: Scheduled Meds: . sodium chloride   Intravenous Once  . allopurinol  300 mg Oral Daily  . Deferasirox  180 mg Oral Daily   . Deferasirox  720 mg Oral Daily  . folic acid  1 mg Oral Daily  . multivitamin with minerals  1 tablet Oral Daily  . oxyCODONE  80 mg Oral Q12H  . senna-docusate  1 tablet Oral BID   Continuous Infusions: . dextrose 5 % and 0.45% NaCl 50 mL/hr at 05/26/18 1018  . diphenhydrAMINE     PRN Meds:.diphenhydrAMINE **OR** diphenhydrAMINE, ondansetron **OR** ondansetron (ZOFRAN) IV, polyethylene glycol   Procedures:  1 unit of PRBCs  Assessment/Plan: Principal Problem:   Sickle cell pain crisis ([place]) Active Problems:   Acute on chronic respiratory failure with hypoxia (HCC)   CKD (chronic kidney disease)   Sickle cell crisis (HCC)  Sickle cell pain crisis Patient typically uses Dilaudid PCA while hospitalized, discontinue Morphine PCA  Start dilaudid PCA with settings of 0.3, 10 minute lockout, with 2 ml/hr D5 0.45% saline at 75 mL/h Continue to monitor pain scale regularly Maintain oxygen saturation above 90%  Chronic pain syndrome:  Continue Oxycontin 80 mg every 12 hours per home medication list Hold Oxycodone 10 mg, use PCA dilaudid as substitute  Chronic kidney disease:  Creatinine is 1.24, consistent with baseline Continue gentle hydration  Follow BMP, repeat in am  Acute on chronic respiratory failure Patient has dyspnea at rest and on exertion. Patient currently requirilng 2L/min of Oxygen BNP and 2D Echo. Last echo 2017, at that time EF was 60-65% with mild pulmonary hypertension  Anemia of chronic disease/symptomatic anemia: Hemoglobin is 7.0, will transfuse 1 unit of PRBCs on today.  Unit of blood will arrive from [place], [place] this afternoon due to patient's history of antibodies. Follow CBC, repeat in am  Proptosis: History of retinopathy. Followed by opthalmology     Code Status: Full Code Family Communication: N/A Disposition Plan: Not yet ready for discharge  [place], MSN, FNP-C Patient [facility] [address] [place], [place] [address] [phone]  If 5PM-7AM, please contact night-coverage.  05/26/2018, 11:24 AM  LOS: 0 days

## 2018-05-26 NOTE — ED Notes (Signed)
ED TO INPATIENT HANDOFF REPORT  Name/Age/Gender Chelsea Hensley 73 y.o. female  Code Status Code Status History    Date Active Date Inactive Code Status Order ID Comments User Context   10/14/2017 2323 10/19/2017 1642 Full Code [id number]  Bethena Roys, MD Inpatient   12/02/2015 1922 12/06/2015 2109 Full Code [id number]  Florencia Reasons, MD Inpatient      Home/SNF/Other Home  Chief Complaint SOB/Sickle Cell Crisis  Level of Care/Admitting Diagnosis ED Disposition    ED Disposition Condition Comment   Admit  The patient appears reasonably stabilized for admission considering the current resources, flow, and capabilities available in the ED at this time, and I doubt any other [facility] requiring further screening and/or treatment in the ED prior to admission is  present.       Medical History Past Medical History:  Diagnosis Date  . Gout   . Hyperuricemia   . Sickle cell anemia (HCC)   . 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 Quinolones Anaphylaxis    All vitals and pressures dropped, blacked out and had seizures    IV Location/Drains/Wounds Patient Lines/Drains/Airways Status   Active Line/Drains/Airways    Name:   Placement date:   Placement time:   Site:   Days:   Peripheral IV 05/25/18 Right;Upper Arm   05/25/18    2100    Arm   1          Labs/Imaging Results for orders placed or performed during the hospital encounter of 05/25/18 (from the past 48 hour(s))  Comprehensive metabolic panel     Status: Abnormal   Collection Time: 05/25/18  8:59 PM  Result Value Ref Range   Sodium 143 135 - 145 mmol/L   Potassium 4.1 3.5 - 5.1 mmol/L   Chloride 117 (H) 98 - 111 mmol/L   CO2 19 (L) 22 - 32 mmol/L   Glucose, Bld 96 70 - 99 mg/dL   BUN 18 8 - 23 mg/dL   Creatinine, Ser 1.24 (H) 0.44 - 1.00 mg/dL   Calcium 10.0 8.9 - 10.3 mg/dL   Total Protein 6.3 (L) 6.5 - 8.1 g/dL   Albumin 4.0  3.5 - 5.0 g/dL   AST 54 (H) 15 - 41 U/L   ALT 29 0 - 44 U/L   Alkaline Phosphatase 120 38 - 126 U/L   Total Bilirubin 4.2 (H) 0.3 - 1.2 mg/dL   GFR calc non Af Amer 43 (L) >60 mL/min   GFR calc Af Amer 50 (L) >60 mL/min   Anion gap 7 5 - 15    Comment: Performed at [facility], [place] [address] [place], [place] [address]  CBC with Differential     Status: Abnormal   Collection Time: 05/25/18  8:59 PM  Result Value Ref Range   WBC 10.1 4.0 - 10.5 K/uL   RBC 2.21 (L) 3.87 - 5.11 MIL/uL   Hemoglobin 7.0 (L) 12.0 - 15.0 g/dL   HCT 21.5 (L) 36.0 - 46.0 %   MCV 97.3 80.0 - 100.0 fL   MCH 31.7 26.0 - 34.0 pg   MCHC 32.6 30.0 - 36.0 g/dL   RDW 29.8 (H) 11.5 - 15.5 %   Platelets 268 150 - 400 K/uL    Comment: REPEATED TO VERIFY PLATELET COUNT CONFIRMED BY SMEAR    nRBC 27.3 (H) 0.0 - 0.2 %   Neutrophils Relative % 50 %  Neutro Abs 5.2 1.7 - 7.7 K/uL   Lymphocytes Relative 34 %   Lymphs Abs 3.4 0.7 - 4.0 K/uL   Monocytes Relative 10 %   Monocytes Absolute 1.0 0.1 - 1.0 K/uL   Eosinophils Relative 4 %   Eosinophils Absolute 0.4 0.0 - 0.5 K/uL   Basophils Relative 1 %   Basophils Absolute 0.1 0.0 - 0.1 K/uL   RBC Morphology RARE NRBCS PRESENT    Immature Granulocytes 1 %   Abs Immature Granulocytes 0.07 0.00 - 0.07 K/uL   Polychromasia PRESENT    Sickle Cells PRESENT    Target Cells PRESENT     Comment: Performed at [facility], [place] [address] [place], [place] [address]  Reticulocytes     Status: Abnormal   Collection Time: 05/25/18  8:59 PM  Result Value Ref Range   Retic Ct Pct 30.0 (H) 0.4 - 3.1 %   RBC. 2.21 (L) 3.87 - 5.11 MIL/uL   Retic Count, Absolute 648.4 (H) 19.0 - 186.0 K/uL   Immature Retic Fract 27.1 (H) 2.3 - 15.9 %    Comment: Performed at [facility], [place] [address], [place], [place] [address]   No results found. None  Pending Labs Unresulted Labs (From admission, onward)   None       Vitals/Pain Today's Vitals   05/25/18 2302 05/25/18 2306 05/25/18 2330 05/26/18 0001  BP: (!) 165/78 (!) 165/78 (!) 106/91 (!) 148/70  Pulse: (!) 37 (!) 104 94 95  Resp: 20 (!) $Remo'23 18 20  'zXVDx$ Temp:      TempSrc:      SpO2: (!) 73% 95% 92% 94%  PainSc:        Isolation Precautions No active isolations  Medications Medications  dextrose 5 %-0.45 % sodium chloride infusion ( Intravenous New Bag/Given 05/25/18 2230)  HYDROmorphone (DILAUDID) injection 1 mg (has no administration in time range)    Or  HYDROmorphone (DILAUDID) injection 1 mg (has no administration in time range)  HYDROmorphone (DILAUDID) injection 0.5 mg (0.5 mg Subcutaneous Given 05/25/18 2014)  HYDROmorphone (DILAUDID) injection 0.5 mg (0.5 mg Intravenous Given 05/25/18 2231)    Or  HYDROmorphone (DILAUDID) injection 0.5 mg ( Subcutaneous See Alternative 05/25/18 2231)  HYDROmorphone (DILAUDID) injection 1 mg (1 mg Intravenous Given 05/25/18 2316)    Or  HYDROmorphone (DILAUDID) injection 1 mg ( Subcutaneous See Alternative 05/25/18 2316)  HYDROmorphone (DILAUDID) injection 1 mg (1 mg Intravenous Given 05/25/18 2354)    Or  HYDROmorphone (DILAUDID) injection 1 mg ( Subcutaneous See Alternative 05/25/18 2354)    Mobility walks

## 2018-05-26 NOTE — H&P (Signed)
H&P        History and Physical    Chelsea Hensley HEN:[id number] DOB: 07/22/1945 DOA: 05/25/2018  PCP: Audley Hose, MD  Patient coming from: Home  I have personally briefly reviewed patient's old medical records in [place]  Chief Complaint: Pain  HPI: Chelsea Hensley is a 73 y.o. female with medical history significant of sickle cell disease and gout presents with pain.  Patient had increasing pain for the past day or so.  She is tried her home oxycodone with aspirin without relief.  Her pain is in her bilateral wrist bilateral legs and her lower back which is typical of her crisis.  She denies any obvious fever chest pain shortness of breath dysuria nausea vomiting.  Has been compliant with her medications.  She refused imaging here today per ER physician. She has been getting transfusion at the [facility] about every 4 to 6 weeks.  She is actually overdue currently. Last admit was may 2019.    ED Course: Patient was hemodynamically stable here.  Baseline creatinine stable at 1.24.  Hemoglobin was 7.  SHe had no leukocytosis.  She was given about 6 and half milligrams of IV Dilaudid with continued pain.  Review of Systems: + back pain leg pain wrist pain All others reviewed with patient  and are  negative unless otherwise stated   Past Medical History:  Diagnosis Date  . Gout   . Hyperuricemia   . Sickle cell anemia (HCC)   . 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 ABDOMINAL HYSTERECTOMY    . CATARACT EXTRACTION, BILATERAL    . CHOLECYSTECTOMY    . HERNIA REPAIR    . TOTAL ABDOMINAL HYSTERECTOMY W/ BILATERAL SALPINGOOPHORECTOMY Bilateral      reports that she quit smoking about 11 years ago. Her smoking use included cigarettes. She smoked 1.00 pack per day. She has never used smokeless tobacco. She reports current alcohol use. She reports that she does not use  drugs.  Allergies  Allergen Reactions  . Quinolones Anaphylaxis    All vitals and pressures dropped, blacked out and had seizures    History reviewed. No pertinent family history.   Prior to Admission medications   Medication Sig Start Date End Date Taking? Authorizing Provider  allopurinol (ZYLOPRIM) 300 MG tablet Take 300 mg by mouth daily.   Yes [provider]  Deferasirox (JADENU) 180 MG TABS Take 180 mg by mouth daily.   Yes [provider]  Deferasirox (JADENU) 360 MG TABS Take 720 mg by mouth daily.   Yes [provider]  folic acid (FOLVITE) 1 MG tablet Take 1 tablet (1 mg total) by mouth daily. 01/24/16  Yes Micheline Chapman, NP  furosemide (LASIX) 20 MG tablet Take 40 mg by mouth daily as needed for fluid.    Yes [provider]  Multiple Vitamin (MULTIVITAMIN WITH MINERALS) TABS tablet Take 1 tablet by mouth daily.   Yes [provider]  naloxone (NARCAN) nasal spray 4 mg/0.1 mL Place 0.4 mg into the nose once.  04/02/18  Yes [provider]  oxyCODONE (OXYCONTIN) 80 mg 12 hr tablet Take 1 tablet (80 mg total) by mouth every 12 (twelve) hours as needed (for severe pain). 01/24/16  Yes Micheline Chapman, NP  oxyCODONE-Aspirin [phone] MG TABS Take 2 tablets by mouth 4 (four) times daily as needed for severe  pain. 04/03/18  Yes [provider]  oxyCODONE 10 MG TABS Take 1 tablet (10 mg total) by mouth every 4 (four) hours as needed for severe pain. Patient not taking: Reported on 05/25/2018 10/19/17   Tresa Garter, MD  predniSONE (DELTASONE) 20 MG tablet Take 3 tablets daily for 2 days, then 2 tablets daily x 2 days then 1 tab daily for 3 days Patient not taking: Reported on 05/25/2018 10/19/17   Tresa Garter, MD    Physical Exam: Vitals:   05/26/18 0100 05/26/18 0130 05/26/18 0200 05/26/18 0230  BP: 139/70 132/72 (!) 143/98 129/71  Pulse: 91 87 98 93  Resp: $Remo'14 11 20 12  'RtuQZ$ Temp:      TempSrc:        SpO2: 93% 92% 93% 91%    Constitutional: NAD, calm, comfortable Vitals:   05/26/18 0100 05/26/18 0130 05/26/18 0200 05/26/18 0230  BP: 139/70 132/72 (!) 143/98 129/71  Pulse: 91 87 98 93  Resp: $Remo'14 11 20 12  'LFRmx$ Temp:      TempSrc:      SpO2: 93% 92% 93% 91%   Eyes: PERRL, lids normal sclera mildly ENMT: Mucous membranes are moist. Posterior pharynx clear of any exudate or lesions.  Neck: normal, supple, Respiratory: clear to auscultation bilaterally, no wheezing, no crackles. Normal respiratory effort. No accessory muscle use.  Cardiovascular: Regular rate and rhythm, no murmurs  No extremity edema. 2+ pedal pulses.  Abdomen: no tenderness, no masses palpated.. Bowel sounds positive.  Musculoskeletal: no clubbing / cyanosis.  Neurologic: CN 2-12 grossly intact. Strength equal in all 4.  Psychiatric: Normal judgment and insight. Alert and oriented x 3. Normal mood.     Labs on Admission: I have personally reviewed following labs and imaging studies  CBC: Recent Labs  Lab 05/25/18 2059  WBC 10.1  NEUTROABS 5.2  HGB 7.0*  HCT 21.5*  MCV 97.3  PLT 007   Basic Metabolic Panel: Recent Labs  Lab 05/25/18 2059  NA 143  K 4.1  CL 117*  CO2 19*  GLUCOSE 96  BUN 18  CREATININE 1.24*  CALCIUM 10.0   GFR: CrCl cannot be calculated (Unknown ideal weight.). Liver Function Tests: Recent Labs  Lab 05/25/18 2059  AST 54*  ALT 29  ALKPHOS 120  BILITOT 4.2*  PROT 6.3*  ALBUMIN 4.0   No results for input(s): LIPASE, AMYLASE in the last 168 hours. No results for input(s): AMMONIA in the last 168 hours. Coagulation Profile: No results for input(s): INR, PROTIME in the last 168 hours. Cardiac Enzymes: No results for input(s): CKTOTAL, CKMB, CKMBINDEX, TROPONINI in the last 168 hours. BNP (last 3 results) No results for input(s): PROBNP in the last 8760 hours. HbA1C: No results for input(s): HGBA1C in the last 72 hours. CBG: No results for input(s): GLUCAP in the last  168 hours. Lipid Profile: No results for input(s): CHOL, HDL, LDLCALC, TRIG, CHOLHDL, LDLDIRECT in the last 72 hours. Thyroid Function Tests: No results for input(s): TSH, T4TOTAL, FREET4, T3FREE, THYROIDAB in the last 72 hours. Anemia Panel: Recent Labs    05/25/18 2059  RETICCTPCT 30.0*   Urine analysis:    Component Value Date/Time   COLORURINE AMBER (A) 12/02/2015 1658   APPEARANCEUR CLEAR 12/02/2015 1658   LABSPEC 1.012 12/02/2015 1658   PHURINE 5.5 12/02/2015 1658   GLUCOSEU NEGATIVE 12/02/2015 1658   HGBUR MODERATE (A) 12/02/2015 1658   BILIRUBINUR NEGATIVE 12/02/2015 1658   KETONESUR NEGATIVE 12/02/2015 1658   PROTEINUR 100 (  A) 12/02/2015 1658   NITRITE NEGATIVE 12/02/2015 1658   LEUKOCYTESUR NEGATIVE 12/02/2015 1658    Radiological Exams on Admission: No results found. Assessment/Plan Principal Problem:   Sickle cell pain crisis ([place]) Active Problems:  chronic respiratory failure with hypoxia (HCC)   CKD (chronic kidney disease)   Sickle cell crisis ([place])    -Observation admission to telemetry.  Transfuse 2 units/h cells.  Continue IV fluids and PCA pump per protocol.  Continue JADENU and folic acid -Chronic kidney disease stable    DVT prophylaxis: SCDs Code Status: Full Disposition Plan: Home 1 to 2 days Admission status: Obs telemetry    Meridian Scherger Johnson-Pitts MD [facility] Pager [phone]  If 7PM-7AM, please contact night-coverage [email or web address] Password [email or web address]  05/26/2018, 2:57 AM

## 2018-05-27 ENCOUNTER — Inpatient Hospital Stay (HOSPITAL_COMMUNITY): Payer: MEDICARE

## 2018-05-27 DIAGNOSIS — J9621 Acute and chronic respiratory failure with hypoxia: Secondary | ICD-10-CM

## 2018-05-27 DIAGNOSIS — N189 Chronic kidney disease, unspecified: Secondary | ICD-10-CM

## 2018-05-27 DIAGNOSIS — D5700 Hb-SS disease with crisis, unspecified: Principal | ICD-10-CM

## 2018-05-27 LAB — CBC
HCT: 23.9 % — ABNORMAL LOW (ref 36.0–46.0)
Hemoglobin: 7.8 g/dL — ABNORMAL LOW (ref 12.0–15.0)
MCH: 30.2 pg (ref 26.0–34.0)
MCHC: 32.6 g/dL (ref 30.0–36.0)
MCV: 92.6 fL (ref 80.0–100.0)
Platelets: 224 10*3/uL (ref 150–400)
RBC: 2.58 MIL/uL — ABNORMAL LOW (ref 3.87–5.11)
RDW: 26.6 % — ABNORMAL HIGH (ref 11.5–15.5)
WBC: 9.2 10*3/uL (ref 4.0–10.5)
nRBC: 37.2 % — ABNORMAL HIGH (ref 0.0–0.2)

## 2018-05-27 LAB — COMPREHENSIVE METABOLIC PANEL
ALBUMIN: 3.7 g/dL (ref 3.5–5.0)
ALT: 25 U/L (ref 0–44)
AST: 43 U/L — ABNORMAL HIGH (ref 15–41)
Alkaline Phosphatase: 106 U/L (ref 38–126)
Anion gap: 8 (ref 5–15)
BUN: 22 mg/dL (ref 8–23)
CO2: 19 mmol/L — ABNORMAL LOW (ref 22–32)
Calcium: 9.4 mg/dL (ref 8.9–10.3)
Chloride: 113 mmol/L — ABNORMAL HIGH (ref 98–111)
Creatinine, Ser: 1.54 mg/dL — ABNORMAL HIGH (ref 0.44–1.00)
GFR calc Af Amer: 39 mL/min — ABNORMAL LOW (ref >60)
GFR calc non Af Amer: 33 mL/min — ABNORMAL LOW (ref >60)
Glucose, Bld: 117 mg/dL — ABNORMAL HIGH (ref 70–99)
Potassium: 5.1 mmol/L (ref 3.5–5.1)
Sodium: 140 mmol/L (ref 135–145)
TOTAL PROTEIN: 5.7 g/dL — AB (ref 6.5–8.1)
Total Bilirubin: 2.4 mg/dL — ABNORMAL HIGH (ref 0.3–1.2)

## 2018-05-27 LAB — ECHOCARDIOGRAM COMPLETE
HEIGHTINCHES: 63 in
Weight: 2291.2 oz

## 2018-05-27 LAB — BRAIN NATRIURETIC PEPTIDE: B Natriuretic Peptide: 690 pg/mL — ABNORMAL HIGH (ref 0.0–100.0)

## 2018-05-27 NOTE — Progress Notes (Signed)
  Echocardiogram 2D Echocardiogram has been performed.  Chelsea Hensley 05/27/2018, 10:57 AM

## 2018-05-27 NOTE — Progress Notes (Signed)
Subjective: Patient says that pain has improved overnight. Current pain intensity is 6/10 primarily to low back and lower extremities. Patient endorses shortness of breath. BNP elevated to 690. Patient to undergo 2D echocardiogram on today.   She denies headache, chest pain, nausea, vomiting, or diarrhea.  Objective:  Vital signs in last 24 hours:  Vitals:   05/26/18 2358 05/27/18 0426 05/27/18 0433 05/27/18 0901  BP: 124/72 (!) 123/58    Pulse: 88 96    Resp: $Remo'20 18 16 16  'FBMYo$ Temp:  98.4 F (36.9 C)    TempSrc:      SpO2: 100% 99% 99% 100%  Weight:      Height:        Intake/Output from previous day:   Intake/Output Summary (Last 24 hours) at 05/27/2018 0915 Last data filed at 05/27/2018 0600 Gross per 24 hour  Intake 2041 ml  Output 625 ml  Net 1416 ml   Physical Exam Constitutional:      Appearance: She is normal weight.  HENT:     Head: Normocephalic.     Nose: Nose normal.  Eyes:     Pupils: Pupils are equal, round, and reactive to light.  Cardiovascular:     Rate and Rhythm: Normal rate. Rhythm irregular.  Pulmonary:     Effort: Pulmonary effort is normal.  Abdominal:     General: Abdomen is flat.  Skin:    General: Skin is warm and dry.  Neurological:     General: No focal deficit present.     Mental Status: She is alert.  Psychiatric:        Mood and Affect: Mood normal.        Behavior: Behavior normal.        Thought Content: Thought content normal.        Judgment: Judgment normal.      Lab Results:  Basic Metabolic Panel:    Component Value Date/Time   NA 140 05/27/2018 0403   K 5.1 05/27/2018 0403   CL 113 (H) 05/27/2018 0403   CO2 19 (L) 05/27/2018 0403   BUN 22 05/27/2018 0403   CREATININE 1.54 (H) 05/27/2018 0403   CREATININE 1.16 (H) 01/12/2016 1007   GLUCOSE 117 (H) 05/27/2018 0403   CALCIUM 9.4 05/27/2018 0403   CBC:    Component Value Date/Time   WBC 9.2 05/27/2018 0403   HGB 7.8 (L) 05/27/2018 0403   HCT 23.9 (L) 05/27/2018  0403   PLT 224 05/27/2018 0403   MCV 92.6 05/27/2018 0403   NEUTROABS 5.2 05/25/2018 2059   LYMPHSABS 3.4 05/25/2018 2059   MONOABS 1.0 05/25/2018 2059   EOSABS 0.4 05/25/2018 2059   BASOSABS 0.1 05/25/2018 2059    No results found for this or any previous visit (from the past 240 hour(s)).  Studies/Results: No results found.  Medications: Scheduled Meds: . sodium chloride   Intravenous Once  . allopurinol  300 mg Oral Daily  . Deferasirox  180 mg Oral Daily  . Deferasirox  720 mg Oral Daily  . folic acid  1 mg Oral Daily  . HYDROmorphone   Intravenous Q4H  . multivitamin with minerals  1 tablet Oral Daily  . oxyCODONE  80 mg Oral Q12H  . senna-docusate  1 tablet Oral BID   Continuous Infusions: . dextrose 5 % and 0.45% NaCl 50 mL/hr at 05/26/18 1018   PRN Meds:.diphenhydrAMINE **OR** [DISCONTINUED] diphenhydrAMINE, naloxone **AND** sodium chloride flush, ondansetron **OR** ondansetron (ZOFRAN) IV, polyethylene glycol  Procedures:  2D echocardiogram  Assessment/Plan: Principal Problem:   Sickle cell pain crisis ([place]) Active Problems:   Acute on chronic respiratory failure with hypoxia (HCC)   CKD (chronic kidney disease)   Sickle cell crisis (HCC)  Sickle cell pain crisis: Continue dilaudid PCA Continue D5 0.45% saline at 50 mL/h Continue to monitor pain scale regularly Maintain oxygen saturation above 90%  Chronic pain syndrome: Continue OxyContin 80 mg every 12 hours Continue to hold oxycodone 10 mg, use PCA Dilaudid as substitute  Chronic kidney disease: Creatinine consistent with baseline Continue gentle hydration Follow BMP, repeat in a.m.  Acute on chronic respiratory failure: Review 2D echocardiogram as results become available Continue supplemental oxygen at 2 L  Anemia of chronic disease: Hemoglobin improved to 7.8, which is consistent with patient's baseline of 7-8.  Continue to follow CBC.  Patient is s/p 1 unit of packed red blood  cells   Code Status: Full Code Family Communication: N/A Disposition Plan: Not yet ready for discharge  [place], MSN, FNP-C Patient [facility] [address] [place], [place] [address] [phone]  If 5PM-7AM, please contact night-coverage.  05/27/2018, 9:15 AM  LOS: 1 day

## 2018-05-28 LAB — BASIC METABOLIC PANEL
Anion gap: 3 — ABNORMAL LOW (ref 5–15)
BUN: 35 mg/dL — ABNORMAL HIGH (ref 8–23)
CO2: 19 mmol/L — ABNORMAL LOW (ref 22–32)
Calcium: 9.7 mg/dL (ref 8.9–10.3)
Chloride: 116 mmol/L — ABNORMAL HIGH (ref 98–111)
Creatinine, Ser: 1.73 mg/dL — ABNORMAL HIGH (ref 0.44–1.00)
GFR calc Af Amer: 34 mL/min — ABNORMAL LOW (ref >60)
GFR calc non Af Amer: 29 mL/min — ABNORMAL LOW (ref >60)
Glucose, Bld: 125 mg/dL — ABNORMAL HIGH (ref 70–99)
Potassium: 5.4 mmol/L — ABNORMAL HIGH (ref 3.5–5.1)
Sodium: 138 mmol/L (ref 135–145)

## 2018-05-28 LAB — CBC
HCT: 24.7 % — ABNORMAL LOW (ref 36.0–46.0)
Hemoglobin: 7.9 g/dL — ABNORMAL LOW (ref 12.0–15.0)
MCH: 30.6 pg (ref 26.0–34.0)
MCHC: 32 g/dL (ref 30.0–36.0)
MCV: 95.7 fL (ref 80.0–100.0)
Platelets: 119 10*3/uL — ABNORMAL LOW (ref 150–400)
RBC: 2.58 MIL/uL — ABNORMAL LOW (ref 3.87–5.11)
RDW: 27.5 % — ABNORMAL HIGH (ref 11.5–15.5)
WBC: 8.3 10*3/uL (ref 4.0–10.5)
nRBC: 26 % — ABNORMAL HIGH (ref 0.0–0.2)

## 2018-05-28 LAB — GLUCOSE, CAPILLARY: Glucose-Capillary: 118 mg/dL — ABNORMAL HIGH (ref 70–99)

## 2018-05-28 MED ORDER — SODIUM CHLORIDE 0.45 % IV SOLN
INTRAVENOUS | Status: AC
  Administered 2018-05-28 (×2): via INTRAVENOUS

## 2018-05-28 MED ORDER — OXYCODONE HCL 5 MG PO TABS
10.0000 mg | ORAL_TABLET | ORAL | Status: DC | PRN
  Administered 2018-05-28 – 2018-05-29 (×2): 10 mg via ORAL
  Filled 2018-05-28 (×4): qty 2

## 2018-05-28 MED ORDER — LIP MEDEX EX OINT: TOPICAL_OINTMENT | CUTANEOUS | Status: DC | PRN

## 2018-05-28 NOTE — Progress Notes (Signed)
Subjective: Patient is sleeping, yet arousable. She appears to be resting comfortably. She says that she has been up throughout the night. She quickly returned to sleep. She has not utilized the PCA overnight.  Creatinine is trending up and is 1.73 on today, which is increased from baseline of 1.2.    Objective:  Vital signs in last 24 hours:  Vitals:   05/28/18 0049 05/28/18 0438 05/28/18 0634 05/28/18 0746  BP:   (!) 125/51   Pulse:   (!) 101   Resp: $Remo'17 11 15 16  'hmYFt$ Temp:   99 F (37.2 C)   TempSrc:   Oral   SpO2:  92% 95% 95%  Weight:      Height:        Intake/Output from previous day:   Intake/Output Summary (Last 24 hours) at 05/28/2018 1014 Last data filed at 05/28/2018 0500 Gross per 24 hour  Intake 770.67 ml  Output 600 ml  Net 170.67 ml   Physical Exam Constitutional:      Appearance: Normal appearance.  HENT:     Head: Normocephalic.     Nose: Nose normal.     Mouth/Throat:     Mouth: Mucous membranes are moist.  Cardiovascular:     Rate and Rhythm: Normal rate and regular rhythm.  Pulmonary:     Effort: Pulmonary effort is normal.     Breath sounds: Normal breath sounds.  Chest:     Chest wall: No tenderness.  Abdominal:     General: Bowel sounds are normal.     Palpations: Abdomen is soft.  Musculoskeletal: Normal range of motion.     Comments: Edema to right hand  Skin:    General: Skin is warm and dry.  Neurological:     Mental Status: She is lethargic.     Cranial Nerves: No cranial nerve deficit or facial asymmetry.     Sensory: No sensory deficit.     Lab Results:  Basic Metabolic Panel:    Component Value Date/Time   NA 140 05/27/2018 0403   K 5.1 05/27/2018 0403   CL 113 (H) 05/27/2018 0403   CO2 19 (L) 05/27/2018 0403   BUN 22 05/27/2018 0403   CREATININE 1.54 (H) 05/27/2018 0403   CREATININE 1.16 (H) 01/12/2016 1007   GLUCOSE 117 (H) 05/27/2018 0403   CALCIUM 9.4 05/27/2018 0403   CBC:    Component Value Date/Time   WBC 8.3  05/28/2018 0621   HGB 7.9 (L) 05/28/2018 0621   HCT 24.7 (L) 05/28/2018 0621   PLT 119 (L) 05/28/2018 0621   MCV 95.7 05/28/2018 0621   NEUTROABS 5.2 05/25/2018 2059   LYMPHSABS 3.4 05/25/2018 2059   MONOABS 1.0 05/25/2018 2059   EOSABS 0.4 05/25/2018 2059   BASOSABS 0.1 05/25/2018 2059    No results found for this or any previous visit (from the past 240 hour(s)).  Studies/Results: No results found.  Medications: Scheduled Meds: . sodium chloride   Intravenous Once  . allopurinol  300 mg Oral Daily  . Deferasirox  180 mg Oral Daily  . Deferasirox  720 mg Oral Daily  . folic acid  1 mg Oral Daily  . multivitamin with minerals  1 tablet Oral Daily  . senna-docusate  1 tablet Oral BID   Continuous Infusions: . dextrose 5 % and 0.45% NaCl 10 mL/hr at 05/27/18 0916   PRN Meds:.ondansetron **OR** ondansetron (ZOFRAN) IV, oxyCODONE, polyethylene glycol  Assessment/Plan: Principal Problem:   Sickle cell pain crisis ([place]) Active  Problems:   Acute on chronic respiratory failure with hypoxia (HCC)   CKD (chronic kidney disease)   Sickle cell crisis (HCC)  Sickle cell pain crisis:  Discontinue dilaudid PCA due to excessive drowsiness Continue 0.45 % saline at 100 ml/hour Continue to monitor pain scale regularly Maintain oxygen saturation above 90%  Chronic pain syndrome:  Discontinue Oxycontin 80 mg every 12 hours. Patient is arousable, but very sleepy today.  Oxycodone 10 mg every 4 hours as needed for pain Spoke with patient's daughter, Dimple Nanas at length and informed her that patient was very sleepy today and I was discontinuing home Oxycotin and all IV pain medications.   Acute on chronic kidney disease:  Creatine increased from baseline. Will hold home Deferasirox due to worsening renal functioning Gentle hydration Repeat BMP in am  Acute on chronic respiratory failure 2 D Echo on 05/27/2018, EF 60-65% Continue supplemental oxygen  Anemia of chronic disease:   Hemoglobin improved to 7.9, which is consistent with baseline. She is s/p 1 unit of PRBCs on 05/25/2017.  CBC in am  Code Status: Full Code Family Communication: N/A Disposition Plan: Not yet ready for discharge  [place], MSN, FNP-C Patient [facility] [address] [place], [place] [address] [phone]  If 5PM-7AM, please contact night-coverage.  05/28/2018, 10:14 AM  LOS: 2 days

## 2018-05-29 DIAGNOSIS — R410 Disorientation, unspecified: Secondary | ICD-10-CM

## 2018-05-29 LAB — BPAM RBC
Blood Product Expiration Date: 202001242359
Blood Product Expiration Date: 202001312359
ISSUE DATE / TIME: 202001071850
Unit Type and Rh: 5100
Unit Type and Rh: 7300

## 2018-05-29 LAB — COMPREHENSIVE METABOLIC PANEL
ALT: 18 U/L (ref 0–44)
AST: 29 U/L (ref 15–41)
Albumin: 3.6 g/dL (ref 3.5–5.0)
Alkaline Phosphatase: 93 U/L (ref 38–126)
Anion gap: 5 (ref 5–15)
BUN: 36 mg/dL — ABNORMAL HIGH (ref 8–23)
CO2: 20 mmol/L — ABNORMAL LOW (ref 22–32)
Calcium: 9.9 mg/dL (ref 8.9–10.3)
Chloride: 114 mmol/L — ABNORMAL HIGH (ref 98–111)
Creatinine, Ser: 1.24 mg/dL — ABNORMAL HIGH (ref 0.44–1.00)
GFR calc Af Amer: 50 mL/min — ABNORMAL LOW (ref >60)
GFR calc non Af Amer: 43 mL/min — ABNORMAL LOW (ref >60)
Glucose, Bld: 107 mg/dL — ABNORMAL HIGH (ref 70–99)
POTASSIUM: 4.8 mmol/L (ref 3.5–5.1)
SODIUM: 139 mmol/L (ref 135–145)
Total Bilirubin: 4 mg/dL — ABNORMAL HIGH (ref 0.3–1.2)
Total Protein: 5.6 g/dL — ABNORMAL LOW (ref 6.5–8.1)

## 2018-05-29 LAB — CBC WITH DIFFERENTIAL/PLATELET
ABS IMMATURE GRANULOCYTES: 0.04 10*3/uL (ref 0.00–0.07)
Basophils Absolute: 0.1 10*3/uL (ref 0.0–0.1)
Basophils Relative: 1 %
Eosinophils Absolute: 0.2 10*3/uL (ref 0.0–0.5)
Eosinophils Relative: 3 %
HCT: 23.1 % — ABNORMAL LOW (ref 36.0–46.0)
Hemoglobin: 7.5 g/dL — ABNORMAL LOW (ref 12.0–15.0)
Immature Granulocytes: 1 %
Lymphocytes Relative: 15 %
Lymphs Abs: 1.1 10*3/uL (ref 0.7–4.0)
MCH: 30.7 pg (ref 26.0–34.0)
MCHC: 32.5 g/dL (ref 30.0–36.0)
MCV: 94.7 fL (ref 80.0–100.0)
Monocytes Absolute: 0.8 10*3/uL (ref 0.1–1.0)
Monocytes Relative: 11 %
NEUTROS ABS: 5 10*3/uL (ref 1.7–7.7)
NRBC: 13.2 % — AB (ref 0.0–0.2)
Neutrophils Relative %: 69 %
Platelets: 173 10*3/uL (ref 150–400)
RBC: 2.44 MIL/uL — ABNORMAL LOW (ref 3.87–5.11)
RDW: 27 % — ABNORMAL HIGH (ref 11.5–15.5)
WBC: 7.2 10*3/uL (ref 4.0–10.5)

## 2018-05-29 LAB — TYPE AND SCREEN
ABO/RH(D): B POS
Antibody Screen: NEGATIVE
UNIT DIVISION: 0
Unit division: 0

## 2018-05-29 LAB — URINALYSIS, COMPLETE (UACMP) WITH MICROSCOPIC
Bilirubin Urine: NEGATIVE
Glucose, UA: NEGATIVE mg/dL
Ketones, ur: NEGATIVE mg/dL
Leukocytes, UA: NEGATIVE
Nitrite: NEGATIVE
Protein, ur: 100 mg/dL — AB
Specific Gravity, Urine: 1.009 (ref 1.005–1.030)
pH: 5 (ref 5.0–8.0)

## 2018-05-29 LAB — URIC ACID: Uric Acid, Serum: 9 mg/dL — ABNORMAL HIGH (ref 2.5–7.1)

## 2018-05-29 MED ORDER — ACETAMINOPHEN 500 MG PO TABS
500.0000 mg | ORAL_TABLET | ORAL | Status: DC | PRN
  Administered 2018-05-29: 500 mg via ORAL
  Filled 2018-05-29: qty 1

## 2018-05-29 MED ORDER — OXYCODONE HCL 5 MG PO TABS
5.0000 mg | ORAL_TABLET | ORAL | Status: DC | PRN
  Administered 2018-05-29 (×2): 5 mg via ORAL
  Filled 2018-05-29 (×2): qty 1

## 2018-05-29 MED ORDER — SODIUM CHLORIDE 0.45 % IV SOLN
INTRAVENOUS | Status: DC
  Administered 2018-05-29: 16:00:00 via INTRAVENOUS

## 2018-05-29 NOTE — Care Management Important Message (Signed)
Important Message  Patient Details  Name: Chelsea Hensley MRN: [id number] Date of Birth: [birth year]   Medicare Important Message Given:  Yes    Kerin Salen 05/29/2018, 12:52 [place] Message  Patient Details  Name: Chelsea Hensley MRN: [id number] Date of Birth: [birth year]   Medicare Important Message Given:  Yes    Kerin Salen 05/29/2018, 12:52 PM

## 2018-05-29 NOTE — Progress Notes (Signed)
Subjective: Patient is somewhat lethargic, yet arousable today. She follows commands, but is very sleepy. Spoke with patient's daughter, Dimple Nanas, who states that behavior is not consistent with patient's baseline. Patient awakens long enough to ask if she was able to be discharged on today.  All IV medications and home pain medication was discontinued on 05/28/2018 due to excessive drowsiness.   Creatinine has returned to baseline today of 1.24.   Patient denies headache, chest pain, dizziness, or paresthesias. She endorses pain to upper and lower extremities. Patient is unable to rate pain with traditional pain scale. When asked whether pain is big or small. Patient responds, "big". Patient requiring maximum assistance with ambulation.   Objective:  Vital signs in last 24 hours:  Vitals:   05/28/18 1921 05/28/18 2208 05/29/18 0442 05/29/18 1503  BP: (!) 135/58 (!) 132/59 (!) 149/53 137/65  Pulse: 97 95 98 93  Resp: (!) $RemoveB'30 16 16 18  'vecymiRm$ Temp: 99.4 F (37.4 C) 98.8 F (37.1 C) 99.2 F (37.3 C) 99.8 F (37.7 C)  TempSrc: Oral Oral Oral Oral  SpO2: 91%  94% 92%  Weight:      Height:        Intake/Output from previous day:   Intake/Output Summary (Last 24 hours) at 05/29/2018 1526 Last data filed at 05/29/2018 1503 Gross per 24 hour  Intake 0 ml  Output 2350 ml  Net -2350 ml   Physical Exam Constitutional:      General: She is not in acute distress.    Appearance: Normal appearance. She is not ill-appearing, toxic-appearing or diaphoretic.  HENT:     Head: Normocephalic.     Nose: Nose normal.     Mouth/Throat:     Mouth: Mucous membranes are moist.  Cardiovascular:     Rate and Rhythm: Normal rate and regular rhythm.  Pulmonary:     Effort: Pulmonary effort is normal.     Breath sounds: Normal breath sounds.  Chest:     Chest wall: No tenderness.  Abdominal:     General: Bowel sounds are normal.     Palpations: Abdomen is soft.  Musculoskeletal:     Right wrist:  She exhibits decreased range of motion and swelling.     Comments: Edema to right hand  Skin:    General: Skin is warm and dry.  Neurological:     Mental Status: She is lethargic.     Cranial Nerves: No cranial nerve deficit or facial asymmetry.     Sensory: No sensory deficit.     Motor: No tremor.     Lab Results:  Basic Metabolic Panel:    Component Value Date/Time   NA 139 05/29/2018 0359   K 4.8 05/29/2018 0359   CL 114 (H) 05/29/2018 0359   CO2 20 (L) 05/29/2018 0359   BUN 36 (H) 05/29/2018 0359   CREATININE 1.24 (H) 05/29/2018 0359   CREATININE 1.16 (H) 01/12/2016 1007   GLUCOSE 107 (H) 05/29/2018 0359   CALCIUM 9.9 05/29/2018 0359   CBC:    Component Value Date/Time   WBC 7.2 05/29/2018 0359   HGB 7.5 (L) 05/29/2018 0359   HCT 23.1 (L) 05/29/2018 0359   PLT 173 05/29/2018 0359   MCV 94.7 05/29/2018 0359   NEUTROABS 5.0 05/29/2018 0359   LYMPHSABS 1.1 05/29/2018 0359   MONOABS 0.8 05/29/2018 0359   EOSABS 0.2 05/29/2018 0359   BASOSABS 0.1 05/29/2018 0359    No results found for this or any previous visit (from  the past 240 hour(s)).  Studies/Results: No results found.  Medications: Scheduled Meds: . sodium chloride   Intravenous Once  . allopurinol  300 mg Oral Daily  . folic acid  1 mg Oral Daily  . multivitamin with minerals  1 tablet Oral Daily  . senna-docusate  1 tablet Oral BID   Continuous Infusions:  PRN Meds:.acetaminophen, lip balm, ondansetron **OR** ondansetron (ZOFRAN) IV, oxyCODONE, polyethylene glycol  Assessment/Plan: Principal Problem:   Sickle cell pain crisis ([place]) Active Problems:   Acute on chronic respiratory failure with hypoxia (HCC)   CKD (chronic kidney disease)   Sickle cell crisis (HCC)  Sickle cell pain crisis:  Continue to refrain from IV pain medications Oxycodone 5 mg every 4 hours as needed for severe pain Continue 0.45 % saline at [facility] Continue to monitor pain scale regularly Maintain oxygen saturation  above 90%  Chronic pain syndrome:  Discontinued Oxycontin 80 mg every 12 hours per home dosage.  Will discontinue OxyContin at discharge.  Patient is opiate nave. Patient is arousable, but very sleepy today.  Spoke with patient's daughter, Dimple Nanas at length and informed her that patient was very sleepy today and I was discontinuing home Oxycotin and all IV pain medications.    Acute on chronic kidney disease:  Creatine return to baseline at 1.24.  Continue to hold Jadenu Gentle hydration Repeat BMP in am  Acute on chronic respiratory failure 2 D Echo on 05/27/2018, EF 60-65% Continue supplemental oxygen  Anemia of chronic disease:  Hemoglobin improved to 7.5, which is consistent with baseline. S/p 1 unit of PRBCs on 05/25/2017.  CBC in am  Altered mental status:  Glucose WNL Will review urinalysis/urine culture as results become available  Physical deconditioning:  Physical therapy consult  Code Status: Full Code Family Communication: N/A Disposition Plan: Not yet ready for discharge  [place], MSN, FNP-C Patient [place], [place] [address] [phone]  If 5PM-7AM, please contact night-coverage.  05/29/2018, 3:26 PM  LOS: 3 days

## 2018-05-29 NOTE — Progress Notes (Signed)
PT Cancellation Note  Patient Details Name: Synda Bagent MRN: [id number] DOB: 1945-06-03   Cancelled Treatment:    Reason Eval/Treat Not Completed: Fatigue/lethargy limiting ability to participatePatient did not arouse with stimulation . Will check back tomorrow. Claretha Cooper 05/29/2018, 4:27 PM  [place] Pager [phone] Office [phone]

## 2018-05-30 LAB — CBC WITH DIFFERENTIAL/PLATELET
Abs Immature Granulocytes: 0.03 10*3/uL (ref 0.00–0.07)
Basophils Absolute: 0.1 10*3/uL (ref 0.0–0.1)
Basophils Relative: 1 %
Eosinophils Absolute: 0.5 10*3/uL (ref 0.0–0.5)
Eosinophils Relative: 8 %
HCT: 23.9 % — ABNORMAL LOW (ref 36.0–46.0)
Hemoglobin: 7.6 g/dL — ABNORMAL LOW (ref 12.0–15.0)
Immature Granulocytes: 0 %
Lymphocytes Relative: 18 %
Lymphs Abs: 1.2 10*3/uL (ref 0.7–4.0)
MCH: 30.4 pg (ref 26.0–34.0)
MCHC: 31.8 g/dL (ref 30.0–36.0)
MCV: 95.6 fL (ref 80.0–100.0)
MONOS PCT: 11 %
Monocytes Absolute: 0.7 10*3/uL (ref 0.1–1.0)
Neutro Abs: 4.2 10*3/uL (ref 1.7–7.7)
Neutrophils Relative %: 62 %
Platelets: 212 10*3/uL (ref 150–400)
RBC: 2.5 MIL/uL — ABNORMAL LOW (ref 3.87–5.11)
RDW: 26.5 % — ABNORMAL HIGH (ref 11.5–15.5)
WBC: 6.8 10*3/uL (ref 4.0–10.5)
nRBC: 4.4 % — ABNORMAL HIGH (ref 0.0–0.2)

## 2018-05-30 LAB — URINE CULTURE: Culture: NO GROWTH

## 2018-05-30 NOTE — Progress Notes (Signed)
Went over discharge papers with patient and family.  All questions answered.  O2 sating  well on RA. Wheeled out by [employer].

## 2018-05-30 NOTE — Progress Notes (Signed)
Patient has become more responsive overnight. Patient has had several stools during the night. No prn's given for pain. Will continue to monitor patient.

## 2018-05-30 NOTE — Evaluation (Signed)
Physical Therapy Evaluation Patient Details Name: Chelsea Hensley MRN: [id number] DOB: [birth year] Today's Date: 05/30/2018   History of Present Illness  73 YO admitted 05/25/18 with  sickle cell crisis/pain.  Clinical Impression  The patient aroused and ambulated with Rw x 80'. Patiwent ndeclines need for AD nor HHPT. Continue mobility while in acute care. Pt admitted with above diagnosis. Pt currently with functional limitations due to the deficits listed below (see PT Problem List).  Pt will benefit from skilled PT to increase their independence and safety with mobility to allow discharge to the venue listed below.       Follow Up Recommendations No PT follow up(pt. declined)    Equipment Recommendations  None recommended by PT    Recommendations for Other Services       Precautions / Restrictions Precautions Precautions: Fall Precaution Comments: watch sats      Mobility  Bed Mobility Overal bed mobility: Independent                Transfers Overall transfer level: Needs assistance Equipment used: Rolling walker (2 wheeled) Transfers: Sit to/from Stand Sit to Stand: Supervision            Ambulation/Gait Ambulation/Gait assistance: Supervision Gait Distance (Feet): 80 Feet Assistive device: Rolling walker (2 wheeled) Gait Pattern/deviations: Step-through pattern     General Gait Details: RW tending to veer. No balance losses  Stairs            Wheelchair Mobility    Modified Rankin (Stroke Patients Only)       Balance Overall balance assessment: No apparent balance deficits (not formally assessed)                                           Pertinent Vitals/Pain Pain Assessment: Faces Faces Pain Scale: Hurts even more Pain Location: generalized"I am always in pain" Pain Intervention(s): Monitored during session    Home Living Family/patient expects to be discharged to:: Private residence Living Arrangements:  Alone Available Help at Discharge: Family Type of Home: Apartment Home Access: Level entry     Home Layout: One level Home Equipment: Gilmer Mor - single point      Prior Function Level of Independence: Independent               Hand Dominance        Extremity/Trunk Assessment   Upper Extremity Assessment Upper Extremity Assessment: Overall WFL for tasks assessed    Lower Extremity Assessment Lower Extremity Assessment: Overall WFL for tasks assessed    Cervical / Trunk Assessment Cervical / Trunk Assessment: Normal  Communication   Communication: No difficulties  Cognition Arousal/Alertness: Awake/alert Behavior During Therapy: WFL for tasks assessed/performed Overall Cognitive Status: Within Functional Limits for tasks assessed                                 General Comments: required extra stimulation  to arouse      General Comments      Exercises     Assessment/Plan    PT Assessment Patient needs continued PT services  PT Problem List Decreased activity tolerance;Decreased mobility;Pain       PT Treatment Interventions DME instruction;Gait training;Functional mobility training;Therapeutic activities;Patient/family education    PT Goals (Current goals can be found in the Care Plan section)  Acute Rehab  PT Goals Patient Stated Goal: to go home today PT Goal Formulation: With patient Time For Goal Achievement: 06/13/18 Potential to Achieve Goals: Good    Frequency Min 3X/week   Barriers to discharge        Co-evaluation               AM-PAC PT "6 Clicks" Mobility  Outcome Measure Help needed turning from your back to your side while in a flat bed without using bedrails?: None Help needed moving from lying on your back to sitting on the side of a flat bed without using bedrails?: None Help needed moving to and from a bed to a chair (including a wheelchair)?: A Little Help needed standing up from a chair using your arms  (e.g., wheelchair or bedside chair)?: A Little Help needed to walk in hospital room?: A Little Help needed climbing 3-5 steps with a railing? : A Little 6 Click Score: 20    End of Session   Activity Tolerance: Patient tolerated treatment well Patient left: in chair;with chair alarm set Nurse Communication: Mobility status PT Visit Diagnosis: Unsteadiness on feet (R26.81)    Time: 0964-3838 PT Time Calculation (min) (ACUTE ONLY): 24 min   Charges:   PT Evaluation $PT Eval Low Complexity: 1 Low PT Treatments $Gait Training: 8-22 mins        Tresa Endo PT Acute Rehabilitation Services Pager [phone] Office [phone]   Claretha Cooper 05/30/2018, 8:45 AM

## 2018-05-30 NOTE — Discharge Summary (Signed)
Physician Discharge Summary  Patient ID: Chelsea Hensley MRN: [id number] DOB/AGE: April 04, 1946 73 y.o.  Admit date: 05/25/2018 Discharge date: 05/30/2018  Admission Diagnoses:  Discharge Diagnoses:  Principal Problem:   Sickle cell pain crisis ([place]) Active Problems:   Acute on chronic respiratory failure with hypoxia (HCC)   CKD (chronic kidney disease)   Sickle cell crisis [facility])   Disorientation   Discharged Condition: good  Hospital Course: This is a 73 year old opiate nave female with history of sickle cell disease and chronic kidney disease stage III who was admitted with sickle cell painful crisis.  Patient also had acute on chronic respiratory failure with hypoxemia.  She is not on home O2.  She has history of gout so initially not sure if she was having gouty attacks.  Patient takes narcotics as needed for pain and she tried it at home with no relief.  In the hospital she was treated with a combination of IV Dilaudid and oral oxycodone.  Also nonsteroidals were given.  She gradually improved to her baseline.  She was subsequently discharged home on home regimen.  Patient requires transfusion ever 4 to 6 weeks.  Her creatinine also got worse initially but improved with hydration.  She has some altered mental status suspected to be due to narcotics use as patient was opiate nave.  No evidence of UTI.  She was transfused 1 unit of packed red blood cells this admission and hemoglobin was stable at discharge.  Consults: None  Significant Diagnostic Studies: labs: Serial CBCs and CMP's were checked.  Her hemoglobin dropped to 7 and she was transfused 1 unit of packed red blood cells.  At discharge hemoglobin was 7.6  Treatments: IV hydration and analgesia: acetaminophen, Dilaudid and oxycodone  Discharge Exam: Blood pressure (!) 147/68, pulse 99, temperature 98.2 F (36.8 C), temperature source Oral, resp. rate (!) 22, height 5' 3"  (1.6 m), weight 65 kg, SpO2 93 %. General appearance: alert,  cooperative, appears stated age and no distress Resp: clear to auscultation bilaterally Cardio: regular rate and rhythm, S1, S2 normal, no murmur, click, rub or gallop GI: soft, non-tender; bowel sounds normal; no masses,  no organomegaly Extremities: extremities normal, atraumatic, no cyanosis or edema Pulses: 2+ and symmetric Neurologic: Grossly normal  Disposition: Discharge disposition: 01-Home or Self Care       Discharge Instructions    Diet - low sodium heart healthy   Complete by:  As directed    Increase activity slowly   Complete by:  As directed      Allergies as of 05/30/2018      Reactions   Quinolones Anaphylaxis   All vitals and pressures dropped, blacked out and had seizures      Medication List    TAKE these medications   allopurinol 300 MG tablet Commonly known as:  ZYLOPRIM Take 300 mg by mouth daily.   folic acid 1 MG tablet Commonly known as:  FOLVITE Take 1 tablet (1 mg total) by mouth daily.   furosemide 20 MG tablet Commonly known as:  LASIX Take 40 mg by mouth daily as needed for fluid.   JADENU 180 MG Tabs Generic drug:  Deferasirox Take 180 mg by mouth daily. Takes 180 mg tab along with 2 320 mg tabs for a 900 mg dose   JADENU 360 MG Tabs Generic drug:  Deferasirox Take 720 mg by mouth daily. Takes 180 mg tab along with 2 320 mg tabs for a 900 mg dose   multivitamin with minerals  Tabs tablet Take 1 tablet by mouth daily.   NARCAN 4 MG/0.1ML Liqd nasal spray kit Generic drug:  naloxone Place 0.4 mg into the nose once.   oxyCODONE 80 mg 12 hr tablet Commonly known as:  OXYCONTIN Take 1 tablet (80 mg total) by mouth every 12 (twelve) hours as needed (for severe pain).   Oxycodone HCl 10 MG Tabs Take 1 tablet (10 mg total) by mouth every 4 (four) hours as needed for severe pain.   oxyCODONE-Aspirin 7.0964-383 MG Tabs Take 2 tablets by mouth 4 (four) times daily as needed for severe pain.   predniSONE 20 MG tablet Commonly  known as:  DELTASONE Take 3 tablets daily for 2 days, then 2 tablets daily x 2 days then 1 tab daily for 3 days        Signed: Aundray Cartlidge,LAWAL 05/30/2018, 12:16 PM   Time spent 37 minutes

## 2018-07-20 ENCOUNTER — Ambulatory Visit (HOSPITAL_COMMUNITY)
Admission: RE | Admit: 2018-07-20 | Discharge: 2018-07-20 | Disposition: A | Discharge status: Home / Self Care | Payer: MEDICARE | Source: Ambulatory Visit | Attending: Internal Medicine | Admitting: Internal Medicine

## 2018-07-20 DIAGNOSIS — D571 Sickle-cell disease without crisis: Secondary | ICD-10-CM | POA: Diagnosis not present

## 2018-07-20 LAB — PREPARE RBC (CROSSMATCH)

## 2018-07-20 NOTE — Progress Notes (Signed)
Patient's labs drawn for Type and Screen. Tolerated well. Patient to return tomorrow for 2 units of of blood.  Alert, oriented and ambulatory at discharge.

## 2018-07-21 ENCOUNTER — Ambulatory Visit (HOSPITAL_COMMUNITY)
Admission: RE | Admit: 2018-07-21 | Discharge: 2018-07-21 | Disposition: A | Discharge status: Home / Self Care | Payer: MEDICARE | Source: Ambulatory Visit | Attending: Internal Medicine | Admitting: Internal Medicine

## 2018-07-21 DIAGNOSIS — D571 Sickle-cell disease without crisis: Secondary | ICD-10-CM | POA: Diagnosis not present

## 2018-07-21 MED ORDER — SODIUM CHLORIDE 0.9% IV SOLUTION
Freq: Once | INTRAVENOUS | Status: AC
  Administered 2018-07-21: 09:00:00 via INTRAVENOUS

## 2018-07-21 MED ORDER — ACETAMINOPHEN 325 MG PO TABS
650.0000 mg | ORAL_TABLET | Freq: Once | ORAL | Status: AC
  Administered 2018-07-21: 650 mg via ORAL
  Filled 2018-07-21: qty 2

## 2018-07-21 MED ORDER — FUROSEMIDE 40 MG PO TABS
40.0000 mg | ORAL_TABLET | Freq: Once | ORAL | Status: AC
  Administered 2018-07-21: 40 mg via ORAL
  Filled 2018-07-21: qty 1

## 2018-07-21 MED ORDER — DIPHENHYDRAMINE HCL 25 MG PO CAPS
50.0000 mg | ORAL_CAPSULE | Freq: Once | ORAL | Status: AC
  Administered 2018-07-21: 50 mg via ORAL
  Filled 2018-07-21: qty 2

## 2018-07-21 NOTE — Progress Notes (Signed)
PATIENT CARE CENTER NOTE  Diagnosis: Sickle Cell Anemia    Provider: Latanya Presser, MD   Procedure: 2 units PRBC's   Note: Patient received 2 units of blood. Pre-medications and Lasix give per order. Tolerated transfusions well with no adverse reaction. Vital signs stable. Discharge instructions given. Patient alert, oriented and ambulatory to wheelchair at discharge.

## 2018-07-21 NOTE — Discharge Instructions (Signed)
Blood Transfusion, Adult, Care After This sheet gives you information about how to care for yourself after your procedure. Your doctor may also give you more specific instructions. If you have problems or questions, contact your doctor. Follow these instructions at home:   Take over-the-counter and prescription medicines only as told by your doctor.  Go back to your normal activities as told by your doctor.  Follow instructions from your doctor about how to take care of the area where an IV tube was put into your vein (insertion site). Make sure you: ? Wash your hands with soap and water before you change your bandage (dressing). If there is no soap and water, use hand sanitizer. ? Change your bandage as told by your doctor.  Check your IV insertion site every day for signs of infection. Check for: ? More redness, swelling, or pain. ? More fluid or blood. ? Warmth. ? Pus or a bad smell. Contact a doctor if:  You have more redness, swelling, or pain around the IV insertion site.  You have more fluid or blood coming from the IV insertion site.  Your IV insertion site feels warm to the touch.  You have pus or a bad smell coming from the IV insertion site.  Your pee (urine) turns pink, red, or brown.  You feel weak after doing your normal activities. Get help right away if:  You have signs of a serious allergic or body defense (immune) system reaction, including: ? Itchiness. ? Hives. ? Trouble breathing. ? Anxiety. ? Pain in your chest or lower back. ? Fever, flushing, and chills. ? Fast pulse. ? Rash. ? Watery poop (diarrhea). ? Throwing up (vomiting). ? Dark pee. ? Serious headache. ? Dizziness. ? Stiff neck. ? Yellow color in your face or the white parts of your eyes (jaundice). Summary  After a blood transfusion, return to your normal activities as told by your doctor.  Every day, check for signs of infection where the IV tube was put into your vein.  Some  signs of infection are warm skin, more redness and pain, more fluid or blood, and pus or a bad smell where the needle went in.  Contact your doctor if you feel weak or have any unusual symptoms. This information is not intended to replace advice given to you by your health care provider. Make sure you discuss any questions you have with your health care provider. Document Released: 05/27/2014 Document Revised: 12/29/2015 Document Reviewed: 12/29/2015 Elsevier Interactive Patient Education  2019 [employer]  

## 2018-07-22 LAB — BPAM RBC
Blood Product Expiration Date: 202004042359
Blood Product Expiration Date: 202004062359
ISSUE DATE / TIME: 202003030817
ISSUE DATE / TIME: 202003030817
Unit Type and Rh: 7300
Unit Type and Rh: 7300

## 2018-07-22 LAB — TYPE AND SCREEN
ABO/RH(D): B POS
Antibody Screen: NEGATIVE
Unit division: 0
Unit division: 0

## 2018-09-07 ENCOUNTER — Other Ambulatory Visit: Payer: Self-pay

## 2018-09-07 ENCOUNTER — Ambulatory Visit (HOSPITAL_COMMUNITY)
Admission: RE | Admit: 2018-09-07 | Discharge: 2018-09-07 | Disposition: A | Discharge status: Home / Self Care | Payer: MEDICARE | Source: Ambulatory Visit | Attending: Internal Medicine | Admitting: Internal Medicine

## 2018-09-07 DIAGNOSIS — D571 Sickle-cell disease without crisis: Secondary | ICD-10-CM | POA: Diagnosis present

## 2018-09-07 DIAGNOSIS — D649 Anemia, unspecified: Secondary | ICD-10-CM | POA: Insufficient documentation

## 2018-09-07 LAB — CBC WITH DIFFERENTIAL/PLATELET
Abs Immature Granulocytes: 0.05 10*3/uL (ref 0.00–0.07)
Basophils Absolute: 0 10*3/uL (ref 0.0–0.1)
Basophils Relative: 1 %
Eosinophils Absolute: 0.5 10*3/uL (ref 0.0–0.5)
Eosinophils Relative: 4 %
HCT: 22 % — ABNORMAL LOW (ref 36.0–46.0)
Hemoglobin: 7 g/dL — ABNORMAL LOW (ref 12.0–15.0)
Immature Granulocytes: 1 %
Lymphocytes Relative: 43 %
Lymphs Abs: 2.5 10*3/uL (ref 0.7–4.0)
MCH: 34.8 pg — ABNORMAL HIGH (ref 26.0–34.0)
MCHC: 32.9 g/dL (ref 30.0–36.0)
MCV: 105.7 fL — ABNORMAL HIGH (ref 80.0–100.0)
Monocytes Absolute: 1.5 10*3/uL — ABNORMAL HIGH (ref 0.1–1.0)
Monocytes Relative: 11 %
Neutro Abs: 4 10*3/uL (ref 1.7–7.7)
Neutrophils Relative %: 40 %
Platelets: 295 10*3/uL (ref 150–400)
RBC: 2.1 MIL/uL — ABNORMAL LOW (ref 3.87–5.11)
RDW: 29.7 % — ABNORMAL HIGH (ref 11.5–15.5)
WBC: 8.5 10*3/uL (ref 4.0–10.5)
nRBC: 45.7 % — ABNORMAL HIGH (ref 0.0–0.2)

## 2018-09-07 LAB — RETICULOCYTES
RBC.: 2.1 MIL/uL — ABNORMAL LOW (ref 3.87–5.11)
Retic Count, Absolute: 662 10*3/uL — ABNORMAL HIGH (ref 19.0–186.0)
Retic Ct Pct: >30 % — ABNORMAL HIGH (ref 0.4–3.1)

## 2018-09-07 LAB — RENAL FUNCTION PANEL
Albumin: 3.8 g/dL (ref 3.5–5.0)
Anion gap: 9 (ref 5–15)
BUN: 27 mg/dL — ABNORMAL HIGH (ref 8–23)
CO2: 20 mmol/L — ABNORMAL LOW (ref 22–32)
Calcium: 9.6 mg/dL (ref 8.9–10.3)
Chloride: 108 mmol/L (ref 98–111)
Creatinine, Ser: 1.59 mg/dL — ABNORMAL HIGH (ref 0.44–1.00)
GFR calc Af Amer: 37 mL/min — ABNORMAL LOW (ref >60)
GFR calc non Af Amer: 32 mL/min — ABNORMAL LOW (ref >60)
Glucose, Bld: 110 mg/dL — ABNORMAL HIGH (ref 70–99)
Phosphorus: 4.8 mg/dL — ABNORMAL HIGH (ref 2.5–4.6)
Potassium: 3.7 mmol/L (ref 3.5–5.1)
Sodium: 137 mmol/L (ref 135–145)

## 2018-09-07 LAB — LACTATE DEHYDROGENASE: LDH: 592 U/L — ABNORMAL HIGH (ref 98–192)

## 2018-09-07 LAB — URIC ACID: Uric Acid, Serum: 8.2 mg/dL — ABNORMAL HIGH (ref 2.5–7.1)

## 2018-09-07 LAB — FERRITIN: Ferritin: 890 ng/mL — ABNORMAL HIGH (ref 11–307)

## 2018-09-07 LAB — PREPARE RBC (CROSSMATCH)

## 2018-09-07 NOTE — Progress Notes (Signed)
Patient came in for lab draw (CBC with diff, Type and screen, Ferritin, Uric acid , renal panel and LDH) with no complications. Patient to come back for blood transfusion tomorrow. Patient was alert and oriented. Transported in a wheel chair at discharge.

## 2018-09-08 ENCOUNTER — Other Ambulatory Visit: Payer: Self-pay

## 2018-09-08 ENCOUNTER — Ambulatory Visit (HOSPITAL_COMMUNITY)
Admission: RE | Admit: 2018-09-08 | Discharge: 2018-09-08 | Disposition: A | Discharge status: Home / Self Care | Payer: MEDICARE | Source: Ambulatory Visit | Attending: Internal Medicine | Admitting: Internal Medicine

## 2018-09-08 DIAGNOSIS — D649 Anemia, unspecified: Secondary | ICD-10-CM | POA: Diagnosis not present

## 2018-09-08 MED ORDER — SODIUM CHLORIDE 0.9% IV SOLUTION
Freq: Once | INTRAVENOUS | Status: AC
  Administered 2018-09-08: 09:00:00 via INTRAVENOUS

## 2018-09-08 MED ORDER — DIPHENHYDRAMINE HCL 25 MG PO CAPS
50.0000 mg | ORAL_CAPSULE | Freq: Once | ORAL | Status: AC
  Administered 2018-09-08: 09:00:00 50 mg via ORAL
  Filled 2018-09-08: qty 2

## 2018-09-08 MED ORDER — FUROSEMIDE 40 MG PO TABS
40.0000 mg | ORAL_TABLET | Freq: Once | ORAL | Status: AC
  Administered 2018-09-08: 11:00:00 40 mg via ORAL
  Filled 2018-09-08: qty 1

## 2018-09-08 MED ORDER — ACETAMINOPHEN 325 MG PO TABS
650.0000 mg | ORAL_TABLET | Freq: Once | ORAL | Status: AC
  Administered 2018-09-08: 650 mg via ORAL
  Filled 2018-09-08: qty 2

## 2018-09-08 NOTE — Discharge Instructions (Signed)
Blood Transfusion, Adult, Care After This sheet gives you information about how to care for yourself after your procedure. Your doctor may also give you more specific instructions. If you have problems or questions, contact your doctor. Follow these instructions at home:   Take over-the-counter and prescription medicines only as told by your doctor.  Go back to your normal activities as told by your doctor.  Follow instructions from your doctor about how to take care of the area where an IV tube was put into your vein (insertion site). Make sure you: ? Wash your hands with soap and water before you change your bandage (dressing). If there is no soap and water, use hand sanitizer. ? Change your bandage as told by your doctor.  Check your IV insertion site every day for signs of infection. Check for: ? More redness, swelling, or pain. ? More fluid or blood. ? Warmth. ? Pus or a bad smell. Contact a doctor if:  You have more redness, swelling, or pain around the IV insertion site.  You have more fluid or blood coming from the IV insertion site.  Your IV insertion site feels warm to the touch.  You have pus or a bad smell coming from the IV insertion site.  Your pee (urine) turns pink, red, or brown.  You feel weak after doing your normal activities. Get help right away if:  You have signs of a serious allergic or body defense (immune) system reaction, including: ? Itchiness. ? Hives. ? Trouble breathing. ? Anxiety. ? Pain in your chest or lower back. ? Fever, flushing, and chills. ? Fast pulse. ? Rash. ? Watery poop (diarrhea). ? Throwing up (vomiting). ? Dark pee. ? Serious headache. ? Dizziness. ? Stiff neck. ? Yellow color in your face or the white parts of your eyes (jaundice). Summary  After a blood transfusion, return to your normal activities as told by your doctor.  Every day, check for signs of infection where the IV tube was put into your vein.  Some  signs of infection are warm skin, more redness and pain, more fluid or blood, and pus or a bad smell where the needle went in.  Contact your doctor if you feel weak or have any unusual symptoms. This information is not intended to replace advice given to you by your health care provider. Make sure you discuss any questions you have with your health care provider. Document Released: 05/27/2014 Document Revised: 12/29/2015 Document Reviewed: 12/29/2015 Elsevier Interactive Patient Education  2019 [employer]  

## 2018-09-08 NOTE — Progress Notes (Signed)
PATIENT CARE CENTER NOTE  Diagnosis: Sickle Cell Anemia    Provider: Latanya Presser, MD   Procedure: 2 units PRBC's    Note: Patient received 2 units of blood. Pre-medications and Lasix given per order. Tolerated transfusion well with no adverse reaction. Vital signs stable. Discharge instructions given. Patient alert, oriented and ambulatory at discharge.

## 2018-09-09 LAB — TYPE AND SCREEN
ABO/RH(D): B POS
Antibody Screen: NEGATIVE
Unit division: 0
Unit division: 0

## 2018-09-09 LAB — BPAM RBC
Blood Product Expiration Date: 202005052359
Blood Product Expiration Date: 202005162359
ISSUE DATE / TIME: 202004210848
ISSUE DATE / TIME: 202004211126
Unit Type and Rh: 7300
Unit Type and Rh: 7300

## 2018-11-12 ENCOUNTER — Ambulatory Visit (HOSPITAL_COMMUNITY)
Admission: RE | Admit: 2018-11-12 | Discharge: 2018-11-12 | Disposition: A | Discharge status: Home / Self Care | Payer: MEDICARE | Source: Ambulatory Visit | Attending: Internal Medicine | Admitting: Internal Medicine

## 2018-11-12 ENCOUNTER — Other Ambulatory Visit: Payer: Self-pay

## 2018-11-12 DIAGNOSIS — D571 Sickle-cell disease without crisis: Secondary | ICD-10-CM | POA: Diagnosis not present

## 2018-11-12 LAB — HEPATIC FUNCTION PANEL
ALT: 21 U/L (ref 0–44)
AST: 41 U/L (ref 15–41)
Albumin: 3.6 g/dL (ref 3.5–5.0)
Alkaline Phosphatase: 150 U/L — ABNORMAL HIGH (ref 38–126)
Bilirubin, Direct: 13.2 mg/dL — ABNORMAL HIGH (ref 0.0–0.2)
Indirect Bilirubin: 7 mg/dL — ABNORMAL HIGH (ref 0.3–0.9)
Total Bilirubin: 20.2 mg/dL (ref 0.3–1.2)
Total Protein: 6.5 g/dL (ref 6.5–8.1)

## 2018-11-12 LAB — PREPARE RBC (CROSSMATCH)

## 2018-11-12 LAB — RENAL FUNCTION PANEL
Albumin: 3.6 g/dL (ref 3.5–5.0)
Anion gap: 13 (ref 5–15)
BUN: 35 mg/dL — ABNORMAL HIGH (ref 8–23)
CO2: 21 mmol/L — ABNORMAL LOW (ref 22–32)
Calcium: 10.7 mg/dL — ABNORMAL HIGH (ref 8.9–10.3)
Chloride: 106 mmol/L (ref 98–111)
Creatinine, Ser: 1.34 mg/dL — ABNORMAL HIGH (ref 0.44–1.00)
GFR calc Af Amer: 45 mL/min — ABNORMAL LOW (ref >60)
GFR calc non Af Amer: 39 mL/min — ABNORMAL LOW (ref >60)
Glucose, Bld: 106 mg/dL — ABNORMAL HIGH (ref 70–99)
Phosphorus: 4.2 mg/dL (ref 2.5–4.6)
Potassium: 3.4 mmol/L — ABNORMAL LOW (ref 3.5–5.1)
Sodium: 140 mmol/L (ref 135–145)

## 2018-11-12 LAB — CBC WITH DIFFERENTIAL/PLATELET
Abs Immature Granulocytes: 0.39 10*3/uL — ABNORMAL HIGH (ref 0.00–0.07)
Basophils Absolute: 0.1 10*3/uL (ref 0.0–0.1)
Basophils Relative: 1 %
Eosinophils Absolute: 0.5 10*3/uL (ref 0.0–0.5)
Eosinophils Relative: 4 %
HCT: 20.3 % — ABNORMAL LOW (ref 36.0–46.0)
Hemoglobin: 6.7 g/dL — CL (ref 12.0–15.0)
Immature Granulocytes: 3 %
Lymphocytes Relative: 24 %
Lymphs Abs: 3.1 10*3/uL (ref 0.7–4.0)
MCH: 33.7 pg (ref 26.0–34.0)
MCHC: 33 g/dL (ref 30.0–36.0)
MCV: 102 fL — ABNORMAL HIGH (ref 80.0–100.0)
Monocytes Absolute: 1.8 10*3/uL — ABNORMAL HIGH (ref 0.1–1.0)
Monocytes Relative: 14 %
Neutro Abs: 7.1 10*3/uL (ref 1.7–7.7)
Neutrophils Relative %: 54 %
Platelets: 265 10*3/uL (ref 150–400)
RBC: 1.99 MIL/uL — ABNORMAL LOW (ref 3.87–5.11)
RDW: 28.6 % — ABNORMAL HIGH (ref 11.5–15.5)
WBC: 13.1 10*3/uL — ABNORMAL HIGH (ref 4.0–10.5)
nRBC: 98.9 % — ABNORMAL HIGH (ref 0.0–0.2)

## 2018-11-12 LAB — RETICULOCYTES
Immature Retic Fract: 35.4 % — ABNORMAL HIGH (ref 2.3–15.9)
RBC.: 1.99 MIL/uL — ABNORMAL LOW (ref 3.87–5.11)
Retic Count, Absolute: 711 10*3/uL — ABNORMAL HIGH (ref 19.0–186.0)
Retic Ct Pct: 35.7 % — ABNORMAL HIGH (ref 0.4–3.1)

## 2018-11-12 LAB — LACTATE DEHYDROGENASE: LDH: 524 U/L — ABNORMAL HIGH (ref 98–192)

## 2018-11-12 LAB — FERRITIN: Ferritin: 1320 ng/mL — ABNORMAL HIGH (ref 11–307)

## 2018-11-12 LAB — URIC ACID: Uric Acid, Serum: 3.8 mg/dL (ref 2.5–7.1)

## 2018-11-12 MED ORDER — SODIUM CHLORIDE 0.9% IV SOLUTION: Freq: Once | INTRAVENOUS | Status: DC

## 2018-11-12 NOTE — Progress Notes (Signed)
CRITICAL VALUE ALERT  Critical Value:  Hemoglobin 6.7  Date & Time Notied:  11/12/18 at 09:17 am  Provider Notified: N/A  Orders Received/Actions taken: Dr. Maia Petties aware of patient's low hemoglobin. Patient schedule for 2 units of blood tomorrow morning.

## 2018-11-12 NOTE — Progress Notes (Signed)
Patient's Type and Screen and other ordered labs drawn. Tolerated well. Patient to come back tomorrow for blood transfusion. Alert, oriented and stable at discharge. Transferred in wheelchair.

## 2018-11-12 NOTE — Progress Notes (Signed)
CRITICAL VALUE ALERT  Critical Value:  Total Bilirubin 20.2  Date & Time Notified: 11/12/2018,  09:59 am  Provider Notified: Leretha Pol MD  Orders Received/Actions taken: To fax lab results to the MD

## 2018-11-13 ENCOUNTER — Ambulatory Visit (HOSPITAL_COMMUNITY)
Admission: RE | Admit: 2018-11-13 | Discharge: 2018-11-13 | Disposition: A | Discharge status: Home / Self Care | Payer: MEDICARE | Source: Ambulatory Visit | Attending: Internal Medicine | Admitting: Internal Medicine

## 2018-11-13 ENCOUNTER — Other Ambulatory Visit: Payer: Self-pay

## 2018-11-13 DIAGNOSIS — D571 Sickle-cell disease without crisis: Secondary | ICD-10-CM | POA: Diagnosis not present

## 2018-11-13 LAB — PREPARE RBC (CROSSMATCH)

## 2018-11-13 MED ORDER — ACETAMINOPHEN 325 MG PO TABS
650.0000 mg | ORAL_TABLET | Freq: Once | ORAL | Status: AC
  Administered 2018-11-13: 650 mg via ORAL
  Filled 2018-11-13: qty 2

## 2018-11-13 MED ORDER — DIPHENHYDRAMINE HCL 25 MG PO CAPS
50.0000 mg | ORAL_CAPSULE | Freq: Once | ORAL | Status: AC
  Administered 2018-11-13: 50 mg via ORAL
  Filled 2018-11-13: qty 2

## 2018-11-13 MED ORDER — SODIUM CHLORIDE 0.9% IV SOLUTION
Freq: Once | INTRAVENOUS | Status: AC
  Administered 2018-11-13: 10:00:00 via INTRAVENOUS

## 2018-11-13 NOTE — Discharge Instructions (Signed)
Blood Transfusion, Adult, Care After This sheet gives you information about how to care for yourself after your procedure. Your doctor may also give you more specific instructions. If you have problems or questions, contact your doctor. Follow these instructions at home:   Take over-the-counter and prescription medicines only as told by your doctor.  Go back to your normal activities as told by your doctor.  Follow instructions from your doctor about how to take care of the area where an IV tube was put into your vein (insertion site). Make sure you: ? Wash your hands with soap and water before you change your bandage (dressing). If there is no soap and water, use hand sanitizer. ? Change your bandage as told by your doctor.  Check your IV insertion site every day for signs of infection. Check for: ? More redness, swelling, or pain. ? More fluid or blood. ? Warmth. ? Pus or a bad smell. Contact a doctor if:  You have more redness, swelling, or pain around the IV insertion site.  You have more fluid or blood coming from the IV insertion site.  Your IV insertion site feels warm to the touch.  You have pus or a bad smell coming from the IV insertion site.  Your pee (urine) turns pink, red, or brown.  You feel weak after doing your normal activities. Get help right away if:  You have signs of a serious allergic or body defense (immune) system reaction, including: ? Itchiness. ? Hives. ? Trouble breathing. ? Anxiety. ? Pain in your chest or lower back. ? Fever, flushing, and chills. ? Fast pulse. ? Rash. ? Watery poop (diarrhea). ? Throwing up (vomiting). ? Dark pee. ? Serious headache. ? Dizziness. ? Stiff neck. ? Yellow color in your face or the white parts of your eyes (jaundice). Summary  After a blood transfusion, return to your normal activities as told by your doctor.  Every day, check for signs of infection where the IV tube was put into your vein.  Some  signs of infection are warm skin, more redness and pain, more fluid or blood, and pus or a bad smell where the needle went in.  Contact your doctor if you feel weak or have any unusual symptoms. This information is not intended to replace advice given to you by your health care provider. Make sure you discuss any questions you have with your health care provider. Document Released: 05/27/2014 Document Revised: 12/29/2015 Document Reviewed: 12/29/2015 Elsevier Interactive Patient Education  2019 [employer]  

## 2018-11-14 LAB — TYPE AND SCREEN
ABO/RH(D): B POS
Antibody Screen: NEGATIVE
Unit division: 0
Unit division: 0

## 2018-11-14 LAB — BPAM RBC
Blood Product Expiration Date: 202007162359
Blood Product Expiration Date: 202007202359
ISSUE DATE / TIME: 202006260925
ISSUE DATE / TIME: 202006261147
Unit Type and Rh: 5100
Unit Type and Rh: 5100

## 2018-11-19 ENCOUNTER — Other Ambulatory Visit: Payer: Self-pay

## 2018-11-19 ENCOUNTER — Ambulatory Visit (HOSPITAL_COMMUNITY)
Admission: RE | Admit: 2018-11-19 | Discharge: 2018-11-19 | Disposition: A | Discharge status: Home / Self Care | Payer: MEDICARE | Source: Ambulatory Visit | Attending: Internal Medicine | Admitting: Internal Medicine

## 2018-11-19 DIAGNOSIS — M109 Gout, unspecified: Secondary | ICD-10-CM | POA: Insufficient documentation

## 2018-11-19 DIAGNOSIS — N183 Chronic kidney disease, stage 3 (moderate): Secondary | ICD-10-CM | POA: Insufficient documentation

## 2018-11-19 DIAGNOSIS — D571 Sickle-cell disease without crisis: Secondary | ICD-10-CM | POA: Insufficient documentation

## 2018-11-19 LAB — CBC WITH DIFFERENTIAL/PLATELET
Abs Immature Granulocytes: 0.02 10*3/uL (ref 0.00–0.07)
Basophils Absolute: 0.1 10*3/uL (ref 0.0–0.1)
Basophils Relative: 1 %
Eosinophils Absolute: 0.6 10*3/uL — ABNORMAL HIGH (ref 0.0–0.5)
Eosinophils Relative: 13 %
HCT: 27.5 % — ABNORMAL LOW (ref 36.0–46.0)
Hemoglobin: 9 g/dL — ABNORMAL LOW (ref 12.0–15.0)
Immature Granulocytes: 0 %
Lymphocytes Relative: 32 %
Lymphs Abs: 1.6 10*3/uL (ref 0.7–4.0)
MCH: 33.1 pg (ref 26.0–34.0)
MCHC: 32.7 g/dL (ref 30.0–36.0)
MCV: 101.1 fL — ABNORMAL HIGH (ref 80.0–100.0)
Monocytes Absolute: 0.5 10*3/uL (ref 0.1–1.0)
Monocytes Relative: 10 %
Neutro Abs: 2.1 10*3/uL (ref 1.7–7.7)
Neutrophils Relative %: 44 %
Platelets: 255 10*3/uL (ref 150–400)
RBC: 2.72 MIL/uL — ABNORMAL LOW (ref 3.87–5.11)
RDW: 18 % — ABNORMAL HIGH (ref 11.5–15.5)
WBC: 4.9 10*3/uL (ref 4.0–10.5)
nRBC: 2.8 % — ABNORMAL HIGH (ref 0.0–0.2)

## 2018-11-19 LAB — HEPATIC FUNCTION PANEL
ALT: 22 U/L (ref 0–44)
AST: 38 U/L (ref 15–41)
Albumin: 3.2 g/dL — ABNORMAL LOW (ref 3.5–5.0)
Alkaline Phosphatase: 119 U/L (ref 38–126)
Bilirubin, Direct: 3.1 mg/dL — ABNORMAL HIGH (ref 0.0–0.2)
Indirect Bilirubin: 3 mg/dL — ABNORMAL HIGH (ref 0.3–0.9)
Total Bilirubin: 6.1 mg/dL — ABNORMAL HIGH (ref 0.3–1.2)
Total Protein: 5.8 g/dL — ABNORMAL LOW (ref 6.5–8.1)

## 2018-11-19 LAB — BASIC METABOLIC PANEL
Anion gap: 9 (ref 5–15)
BUN: 39 mg/dL — ABNORMAL HIGH (ref 8–23)
CO2: 23 mmol/L (ref 22–32)
Calcium: 9.4 mg/dL (ref 8.9–10.3)
Chloride: 107 mmol/L (ref 98–111)
Creatinine, Ser: 0.95 mg/dL (ref 0.44–1.00)
GFR calc Af Amer: >60 mL/min (ref >60)
GFR calc non Af Amer: 59 mL/min — ABNORMAL LOW (ref >60)
Glucose, Bld: 120 mg/dL — ABNORMAL HIGH (ref 70–99)
Potassium: 5.3 mmol/L — ABNORMAL HIGH (ref 3.5–5.1)
Sodium: 139 mmol/L (ref 135–145)

## 2018-11-19 NOTE — Progress Notes (Signed)
PATIENT CARE CENTER NOTE  Diagnosis: Sickle Cell Anemia    Provider: Dr. Maia Petties   Procedure: Lab draw (CBC, BMP and Hepatic panel)   Note: Patient's labs drawn. Tolerated well. Patient refused AVS. Alert, oriented and ambulatory at discharge.

## 2018-12-30 ENCOUNTER — Ambulatory Visit (HOSPITAL_COMMUNITY)
Admission: RE | Admit: 2018-12-30 | Discharge: 2018-12-30 | Disposition: A | Discharge status: Home / Self Care | Payer: MEDICARE | Source: Ambulatory Visit | Attending: Internal Medicine | Admitting: Internal Medicine

## 2018-12-30 ENCOUNTER — Other Ambulatory Visit: Payer: Self-pay

## 2018-12-30 DIAGNOSIS — D571 Sickle-cell disease without crisis: Secondary | ICD-10-CM | POA: Insufficient documentation

## 2018-12-30 LAB — FERRITIN: Ferritin: 1295 ng/mL — ABNORMAL HIGH (ref 11–307)

## 2018-12-30 LAB — RENAL FUNCTION PANEL
Albumin: 3.2 g/dL — ABNORMAL LOW (ref 3.5–5.0)
Anion gap: 6 (ref 5–15)
BUN: 25 mg/dL — ABNORMAL HIGH (ref 8–23)
CO2: 24 mmol/L (ref 22–32)
Calcium: 9.3 mg/dL (ref 8.9–10.3)
Chloride: 109 mmol/L (ref 98–111)
Creatinine, Ser: 1.04 mg/dL — ABNORMAL HIGH (ref 0.44–1.00)
GFR calc Af Amer: >60 mL/min (ref >60)
GFR calc non Af Amer: 53 mL/min — ABNORMAL LOW (ref >60)
Glucose, Bld: 114 mg/dL — ABNORMAL HIGH (ref 70–99)
Phosphorus: 3.3 mg/dL (ref 2.5–4.6)
Potassium: 4.7 mmol/L (ref 3.5–5.1)
Sodium: 139 mmol/L (ref 135–145)

## 2018-12-30 LAB — CBC WITH DIFFERENTIAL/PLATELET
Abs Immature Granulocytes: 0.01 10*3/uL (ref 0.00–0.07)
Basophils Absolute: 0.1 10*3/uL (ref 0.0–0.1)
Basophils Relative: 1 %
Eosinophils Absolute: 0.5 10*3/uL (ref 0.0–0.5)
Eosinophils Relative: 10 %
HCT: 21.9 % — ABNORMAL LOW (ref 36.0–46.0)
Hemoglobin: 7.4 g/dL — ABNORMAL LOW (ref 12.0–15.0)
Immature Granulocytes: 0 %
Lymphocytes Relative: 30 %
Lymphs Abs: 1.6 10*3/uL (ref 0.7–4.0)
MCH: 34.9 pg — ABNORMAL HIGH (ref 26.0–34.0)
MCHC: 33.8 g/dL (ref 30.0–36.0)
MCV: 103.3 fL — ABNORMAL HIGH (ref 80.0–100.0)
Monocytes Absolute: 0.7 10*3/uL (ref 0.1–1.0)
Monocytes Relative: 14 %
Neutro Abs: 2.4 10*3/uL (ref 1.7–7.7)
Neutrophils Relative %: 45 %
Platelets: 283 10*3/uL (ref 150–400)
RBC: 2.12 MIL/uL — ABNORMAL LOW (ref 3.87–5.11)
RDW: 21.9 % — ABNORMAL HIGH (ref 11.5–15.5)
WBC: 5.2 10*3/uL (ref 4.0–10.5)
nRBC: 1 % — ABNORMAL HIGH (ref 0.0–0.2)

## 2018-12-30 LAB — LACTATE DEHYDROGENASE: LDH: 293 U/L — ABNORMAL HIGH (ref 98–192)

## 2018-12-30 LAB — HEPATIC FUNCTION PANEL
ALT: 17 U/L (ref 0–44)
AST: 25 U/L (ref 15–41)
Albumin: 3.2 g/dL — ABNORMAL LOW (ref 3.5–5.0)
Alkaline Phosphatase: 129 U/L — ABNORMAL HIGH (ref 38–126)
Bilirubin, Direct: 2 mg/dL — ABNORMAL HIGH (ref 0.0–0.2)
Indirect Bilirubin: 3.3 mg/dL — ABNORMAL HIGH (ref 0.3–0.9)
Total Bilirubin: 5.3 mg/dL — ABNORMAL HIGH (ref 0.3–1.2)
Total Protein: 6.2 g/dL — ABNORMAL LOW (ref 6.5–8.1)

## 2018-12-30 LAB — RETICULOCYTES
Immature Retic Fract: 18 % — ABNORMAL HIGH (ref 2.3–15.9)
RBC.: 2.12 MIL/uL — ABNORMAL LOW (ref 3.87–5.11)
Retic Count, Absolute: 241 10*3/uL — ABNORMAL HIGH (ref 19.0–186.0)
Retic Ct Pct: 11.4 % — ABNORMAL HIGH (ref 0.4–3.1)

## 2018-12-30 LAB — URIC ACID: Uric Acid, Serum: 5.7 mg/dL (ref 2.5–7.1)

## 2018-12-30 NOTE — Progress Notes (Signed)
PATIENT CARE CENTER NOTE  Diagnosis: Sickle Cell Anemia    Provider: Dr. Maia Petties   Procedure: Lab draw   Note: Type and Screen and other labs drawn. Patient to come back tomorrow for 2 units of blood. Patient alert, oriented and ambulatory at discharge.

## 2018-12-31 ENCOUNTER — Ambulatory Visit (HOSPITAL_COMMUNITY)
Admission: RE | Admit: 2018-12-31 | Discharge: 2018-12-31 | Disposition: A | Discharge status: Home / Self Care | Payer: MEDICARE | Source: Ambulatory Visit | Attending: Internal Medicine | Admitting: Internal Medicine

## 2018-12-31 DIAGNOSIS — D571 Sickle-cell disease without crisis: Secondary | ICD-10-CM | POA: Diagnosis not present

## 2018-12-31 LAB — PREPARE RBC (CROSSMATCH)

## 2018-12-31 MED ORDER — SODIUM CHLORIDE 0.9% IV SOLUTION
Freq: Once | INTRAVENOUS | Status: AC
  Administered 2018-12-31: 09:00:00 via INTRAVENOUS

## 2018-12-31 MED ORDER — DIPHENHYDRAMINE HCL 25 MG PO CAPS
50.0000 mg | ORAL_CAPSULE | Freq: Once | ORAL | Status: AC
  Administered 2018-12-31: 50 mg via ORAL
  Filled 2018-12-31: qty 2

## 2018-12-31 MED ORDER — ACETAMINOPHEN 325 MG PO TABS
650.0000 mg | ORAL_TABLET | Freq: Once | ORAL | Status: AC
  Administered 2018-12-31: 09:00:00 650 mg via ORAL
  Filled 2018-12-31: qty 2

## 2018-12-31 NOTE — Progress Notes (Signed)
Patient received 2 units of packed red blood cells via PIV. Tolerated well, vitals stable, discharge instructions given, verbalized understanding. Patient alert, oriented and transported in a wheel chair at the time of discharge.

## 2018-12-31 NOTE — Discharge Instructions (Signed)
Blood Transfusion, Adult, Care After This sheet gives you information about how to care for yourself after your procedure. Your doctor may also give you more specific instructions. If you have problems or questions, contact your doctor. Follow these instructions at home:   Take over-the-counter and prescription medicines only as told by your doctor.  Go back to your normal activities as told by your doctor.  Follow instructions from your doctor about how to take care of the area where an IV tube was put into your vein (insertion site). Make sure you: ? Wash your hands with soap and water before you change your bandage (dressing). If there is no soap and water, use hand sanitizer. ? Change your bandage as told by your doctor.  Check your IV insertion site every day for signs of infection. Check for: ? More redness, swelling, or pain. ? More fluid or blood. ? Warmth. ? Pus or a bad smell. Contact a doctor if:  You have more redness, swelling, or pain around the IV insertion site.  You have more fluid or blood coming from the IV insertion site.  Your IV insertion site feels warm to the touch.  You have pus or a bad smell coming from the IV insertion site.  Your pee (urine) turns pink, red, or brown.  You feel weak after doing your normal activities. Get help right away if:  You have signs of a serious allergic or body defense (immune) system reaction, including: ? Itchiness. ? Hives. ? Trouble breathing. ? Anxiety. ? Pain in your chest or lower back. ? Fever, flushing, and chills. ? Fast pulse. ? Rash. ? Watery poop (diarrhea). ? Throwing up (vomiting). ? Dark pee. ? Serious headache. ? Dizziness. ? Stiff neck. ? Yellow color in your face or the white parts of your eyes (jaundice). Summary  After a blood transfusion, return to your normal activities as told by your doctor.  Every day, check for signs of infection where the IV tube was put into your vein.  Some  signs of infection are warm skin, more redness and pain, more fluid or blood, and pus or a bad smell where the needle went in.  Contact your doctor if you feel weak or have any unusual symptoms. This information is not intended to replace advice given to you by your health care provider. Make sure you discuss any questions you have with your health care provider. Document Released: 05/27/2014 Document Revised: 09/10/2017 Document Reviewed: 12/29/2015 Elsevier Patient Education  2020 [employer]  

## 2019-01-01 LAB — TYPE AND SCREEN
ABO/RH(D): B POS
Antibody Screen: NEGATIVE
Unit division: 0
Unit division: 0

## 2019-01-01 LAB — BPAM RBC
Blood Product Expiration Date: 202008262359
Blood Product Expiration Date: 202008272359
ISSUE DATE / TIME: 202008130843
ISSUE DATE / TIME: 202008130843
Unit Type and Rh: 7300
Unit Type and Rh: 7300

## 2019-02-08 ENCOUNTER — Other Ambulatory Visit: Payer: Self-pay | Admitting: Internal Medicine

## 2019-02-08 DIAGNOSIS — Z1231 Encounter for screening mammogram for malignant neoplasm of breast: Secondary | ICD-10-CM

## 2019-03-02 ENCOUNTER — Other Ambulatory Visit: Payer: Self-pay

## 2019-03-02 ENCOUNTER — Ambulatory Visit (HOSPITAL_COMMUNITY)
Admission: RE | Admit: 2019-03-02 | Discharge: 2019-03-02 | Disposition: A | Discharge status: Home / Self Care | Payer: MEDICARE | Source: Ambulatory Visit | Attending: Internal Medicine | Admitting: Internal Medicine

## 2019-03-02 ENCOUNTER — Other Ambulatory Visit: Payer: Self-pay | Admitting: Internal Medicine

## 2019-03-02 DIAGNOSIS — D571 Sickle-cell disease without crisis: Secondary | ICD-10-CM | POA: Diagnosis present

## 2019-03-02 DIAGNOSIS — Z13820 Encounter for screening for osteoporosis: Secondary | ICD-10-CM

## 2019-03-02 MED ORDER — SODIUM CHLORIDE 0.9% IV SOLUTION: Freq: Once | INTRAVENOUS | Status: DC

## 2019-03-02 NOTE — Progress Notes (Signed)
Patient type and crossed today. Will be coming back for blood transfusion tomorrow. Vitals stable, alert, oriented and transported in a wheelchair at the time of discharge.

## 2019-03-03 ENCOUNTER — Ambulatory Visit (HOSPITAL_COMMUNITY)
Admission: RE | Admit: 2019-03-03 | Discharge: 2019-03-03 | Disposition: A | Discharge status: Home / Self Care | Payer: MEDICARE | Source: Ambulatory Visit | Attending: Internal Medicine | Admitting: Internal Medicine

## 2019-03-03 DIAGNOSIS — D571 Sickle-cell disease without crisis: Secondary | ICD-10-CM | POA: Diagnosis not present

## 2019-03-03 LAB — PREPARE RBC (CROSSMATCH)

## 2019-03-03 MED ORDER — DIPHENHYDRAMINE HCL 25 MG PO CAPS
50.0000 mg | ORAL_CAPSULE | Freq: Once | ORAL | Status: AC
  Administered 2019-03-03: 50 mg via ORAL
  Filled 2019-03-03: qty 2

## 2019-03-03 MED ORDER — FUROSEMIDE 10 MG/ML IJ SOLN
40.0000 mg | Freq: Once | INTRAMUSCULAR | Status: AC
  Administered 2019-03-03: 40 mg via INTRAVENOUS
  Filled 2019-03-03: qty 4

## 2019-03-03 MED ORDER — SODIUM CHLORIDE 0.9% IV SOLUTION
Freq: Once | INTRAVENOUS | Status: AC
  Administered 2019-03-03: 10:00:00 via INTRAVENOUS

## 2019-03-03 MED ORDER — ACETAMINOPHEN 325 MG PO TABS
650.0000 mg | ORAL_TABLET | Freq: Once | ORAL | Status: AC
  Administered 2019-03-03: 650 mg via ORAL
  Filled 2019-03-03: qty 2

## 2019-03-03 NOTE — Progress Notes (Signed)
PATIENT CARE CENTER NOTE  Diagnosis: Sickle Cell Anemia    Provider: Latanya Presser, MD   Procedure: 2 units PRBC's    Note: Patient received 2 units of blood. Pre-medications and Lasix given per order. Tolerated transfusion well with no adverse reaction. Vital signs stable. Discharge instructions given. Patient alert, oriented and ambulatory to wheelchair at discharge.

## 2019-03-03 NOTE — Discharge Instructions (Signed)
Blood Transfusion, Adult, Care After This sheet gives you information about how to care for yourself after your procedure. Your doctor may also give you more specific instructions. If you have problems or questions, contact your doctor. Follow these instructions at home:   Take over-the-counter and prescription medicines only as told by your doctor.  Go back to your normal activities as told by your doctor.  Follow instructions from your doctor about how to take care of the area where an IV tube was put into your vein (insertion site). Make sure you: ? Wash your hands with soap and water before you change your bandage (dressing). If there is no soap and water, use hand sanitizer. ? Change your bandage as told by your doctor.  Check your IV insertion site every day for signs of infection. Check for: ? More redness, swelling, or pain. ? More fluid or blood. ? Warmth. ? Pus or a bad smell. Contact a doctor if:  You have more redness, swelling, or pain around the IV insertion site.  You have more fluid or blood coming from the IV insertion site.  Your IV insertion site feels warm to the touch.  You have pus or a bad smell coming from the IV insertion site.  Your pee (urine) turns pink, red, or brown.  You feel weak after doing your normal activities. Get help right away if:  You have signs of a serious allergic or body defense (immune) system reaction, including: ? Itchiness. ? Hives. ? Trouble breathing. ? Anxiety. ? Pain in your chest or lower back. ? Fever, flushing, and chills. ? Fast pulse. ? Rash. ? Watery poop (diarrhea). ? Throwing up (vomiting). ? Dark pee. ? Serious headache. ? Dizziness. ? Stiff neck. ? Yellow color in your face or the white parts of your eyes (jaundice). Summary  After a blood transfusion, return to your normal activities as told by your doctor.  Every day, check for signs of infection where the IV tube was put into your vein.  Some  signs of infection are warm skin, more redness and pain, more fluid or blood, and pus or a bad smell where the needle went in.  Contact your doctor if you feel weak or have any unusual symptoms. This information is not intended to replace advice given to you by your health care provider. Make sure you discuss any questions you have with your health care provider. Document Released: 05/27/2014 Document Revised: 09/10/2017 Document Reviewed: 12/29/2015 Elsevier Patient Education  2020 [employer]  

## 2019-03-04 LAB — BPAM RBC
Blood Product Expiration Date: 202010302359
Blood Product Expiration Date: 202011062359
ISSUE DATE / TIME: 202010140934
ISSUE DATE / TIME: 202010140934
Unit Type and Rh: 5100
Unit Type and Rh: 7300

## 2019-03-04 LAB — TYPE AND SCREEN
ABO/RH(D): B POS
Antibody Screen: NEGATIVE
Unit division: 0
Unit division: 0

## 2019-04-20 ENCOUNTER — Encounter (HOSPITAL_COMMUNITY): Payer: Self-pay

## 2019-04-20 ENCOUNTER — Inpatient Hospital Stay (HOSPITAL_COMMUNITY)
Admission: EM | Admit: 2019-04-20 | Discharge: 2019-04-22 | DRG: 812 | Disposition: A | Discharge status: Home / Self Care | Payer: MEDICARE | Attending: Internal Medicine | Admitting: Internal Medicine

## 2019-04-20 ENCOUNTER — Other Ambulatory Visit: Payer: Self-pay

## 2019-04-20 DIAGNOSIS — Z9981 Dependence on supplemental oxygen: Secondary | ICD-10-CM

## 2019-04-20 DIAGNOSIS — R0609 Other forms of dyspnea: Secondary | ICD-10-CM

## 2019-04-20 DIAGNOSIS — Z87891 Personal history of nicotine dependence: Secondary | ICD-10-CM

## 2019-04-20 DIAGNOSIS — M8580 Other specified disorders of bone density and structure, unspecified site: Secondary | ICD-10-CM | POA: Diagnosis present

## 2019-04-20 DIAGNOSIS — N184 Chronic kidney disease, stage 4 (severe): Secondary | ICD-10-CM | POA: Diagnosis present

## 2019-04-20 DIAGNOSIS — G894 Chronic pain syndrome: Secondary | ICD-10-CM | POA: Diagnosis present

## 2019-04-20 DIAGNOSIS — M109 Gout, unspecified: Secondary | ICD-10-CM | POA: Diagnosis present

## 2019-04-20 DIAGNOSIS — D631 Anemia in chronic kidney disease: Secondary | ICD-10-CM | POA: Diagnosis present

## 2019-04-20 DIAGNOSIS — D649 Anemia, unspecified: Secondary | ICD-10-CM | POA: Diagnosis present

## 2019-04-20 DIAGNOSIS — D5700 Hb-SS disease with crisis, unspecified: Principal | ICD-10-CM | POA: Diagnosis present

## 2019-04-20 DIAGNOSIS — N189 Chronic kidney disease, unspecified: Secondary | ICD-10-CM

## 2019-04-20 DIAGNOSIS — D72829 Elevated white blood cell count, unspecified: Secondary | ICD-10-CM | POA: Diagnosis present

## 2019-04-20 DIAGNOSIS — Z20828 Contact with and (suspected) exposure to other viral communicable diseases: Secondary | ICD-10-CM | POA: Diagnosis present

## 2019-04-20 DIAGNOSIS — Z90710 Acquired absence of both cervix and uterus: Secondary | ICD-10-CM

## 2019-04-20 DIAGNOSIS — Z79899 Other long term (current) drug therapy: Secondary | ICD-10-CM

## 2019-04-20 DIAGNOSIS — J9621 Acute and chronic respiratory failure with hypoxia: Secondary | ICD-10-CM | POA: Diagnosis present

## 2019-04-20 DIAGNOSIS — N179 Acute kidney failure, unspecified: Secondary | ICD-10-CM

## 2019-04-20 LAB — CBC WITH DIFFERENTIAL/PLATELET
Abs Immature Granulocytes: 0.27 10*3/uL — ABNORMAL HIGH (ref 0.00–0.07)
Basophils Absolute: 0.1 10*3/uL (ref 0.0–0.1)
Basophils Relative: 1 %
Eosinophils Absolute: 0.1 10*3/uL (ref 0.0–0.5)
Eosinophils Relative: 1 %
HCT: 18.6 % — ABNORMAL LOW (ref 36.0–46.0)
Hemoglobin: 6.2 g/dL — CL (ref 12.0–15.0)
Immature Granulocytes: 2 %
Lymphocytes Relative: 11 %
Lymphs Abs: 1.4 10*3/uL (ref 0.7–4.0)
MCH: 36.7 pg — ABNORMAL HIGH (ref 26.0–34.0)
MCHC: 33.3 g/dL (ref 30.0–36.0)
MCV: 110.1 fL — ABNORMAL HIGH (ref 80.0–100.0)
Monocytes Absolute: 2.4 10*3/uL — ABNORMAL HIGH (ref 0.1–1.0)
Monocytes Relative: 19 %
Neutro Abs: 8.9 10*3/uL — ABNORMAL HIGH (ref 1.7–7.7)
Neutrophils Relative %: 66 %
Platelets: 305 10*3/uL (ref 150–400)
RBC: 1.69 MIL/uL — ABNORMAL LOW (ref 3.87–5.11)
RDW: 32.5 % — ABNORMAL HIGH (ref 11.5–15.5)
WBC: 13.1 10*3/uL — ABNORMAL HIGH (ref 4.0–10.5)
nRBC: 196.4 % — ABNORMAL HIGH (ref 0.0–0.2)

## 2019-04-20 LAB — PREPARE RBC (CROSSMATCH)

## 2019-04-20 LAB — COMPREHENSIVE METABOLIC PANEL
ALT: 24 U/L (ref 0–44)
AST: 28 U/L (ref 15–41)
Albumin: 4 g/dL (ref 3.5–5.0)
Alkaline Phosphatase: 111 U/L (ref 38–126)
Anion gap: 9 (ref 5–15)
BUN: 84 mg/dL — ABNORMAL HIGH (ref 8–23)
CO2: 16 mmol/L — ABNORMAL LOW (ref 22–32)
Calcium: 9.8 mg/dL (ref 8.9–10.3)
Chloride: 112 mmol/L — ABNORMAL HIGH (ref 98–111)
Creatinine, Ser: 2.06 mg/dL — ABNORMAL HIGH (ref 0.44–1.00)
GFR calc Af Amer: 27 mL/min — ABNORMAL LOW (ref >60)
GFR calc non Af Amer: 23 mL/min — ABNORMAL LOW (ref >60)
Glucose, Bld: 113 mg/dL — ABNORMAL HIGH (ref 70–99)
Potassium: 4.3 mmol/L (ref 3.5–5.1)
Sodium: 137 mmol/L (ref 135–145)
Total Bilirubin: 8.5 mg/dL — ABNORMAL HIGH (ref 0.3–1.2)
Total Protein: 6.3 g/dL — ABNORMAL LOW (ref 6.5–8.1)

## 2019-04-20 LAB — RETICULOCYTES
Immature Retic Fract: 38.6 % — ABNORMAL HIGH (ref 2.3–15.9)
RBC.: 1.69 MIL/uL — ABNORMAL LOW (ref 3.87–5.11)
Retic Count, Absolute: 681.7 10*3/uL — ABNORMAL HIGH (ref 19.0–186.0)
Retic Ct Pct: 40.3 % — ABNORMAL HIGH (ref 0.4–3.1)

## 2019-04-20 LAB — SARS CORONAVIRUS 2 (TAT 6-24 HRS): SARS Coronavirus 2: NEGATIVE

## 2019-04-20 MED ORDER — HYDROMORPHONE HCL 2 MG/ML IJ SOLN
2.0000 mg | INTRAMUSCULAR | Status: DC | PRN
  Administered 2019-04-20 (×2): 2 mg via INTRAVENOUS
  Filled 2019-04-20 (×2): qty 1

## 2019-04-20 MED ORDER — ADULT MULTIVITAMIN W/MINERALS CH
1.0000 | ORAL_TABLET | Freq: Every day | ORAL | Status: DC
  Administered 2019-04-21 – 2019-04-22 (×2): 1 via ORAL
  Filled 2019-04-20 (×2): qty 1

## 2019-04-20 MED ORDER — SODIUM CHLORIDE 0.45 % IV SOLN
INTRAVENOUS | Status: DC
  Administered 2019-04-20: 19:00:00 via INTRAVENOUS

## 2019-04-20 MED ORDER — DEFERASIROX 180 MG PO TABS: 180.0000 mg | ORAL_TABLET | Freq: Every day | ORAL | Status: DC

## 2019-04-20 MED ORDER — FUROSEMIDE 40 MG PO TABS: 40.0000 mg | ORAL_TABLET | Freq: Every day | ORAL | Status: DC | PRN

## 2019-04-20 MED ORDER — SODIUM CHLORIDE 0.9% FLUSH: 3.0000 mL | Freq: Once | INTRAVENOUS | Status: DC

## 2019-04-20 MED ORDER — FOLIC ACID 1 MG PO TABS
1.0000 mg | ORAL_TABLET | Freq: Every day | ORAL | Status: DC
  Administered 2019-04-21 – 2019-04-22 (×2): 1 mg via ORAL
  Filled 2019-04-20 (×2): qty 1

## 2019-04-20 MED ORDER — SODIUM CHLORIDE 0.9 % IV BOLUS
1000.0000 mL | Freq: Once | INTRAVENOUS | Status: AC
  Administered 2019-04-20: 1000 mL via INTRAVENOUS

## 2019-04-20 MED ORDER — HYDROMORPHONE HCL 1 MG/ML IJ SOLN: 1.0000 mg | INTRAMUSCULAR | Status: DC | PRN

## 2019-04-20 MED ORDER — SODIUM CHLORIDE 0.9 % IV SOLN: 10.0000 mL/h | Freq: Once | INTRAVENOUS | Status: DC

## 2019-04-20 MED ORDER — ALLOPURINOL 300 MG PO TABS: 300.0000 mg | ORAL_TABLET | Freq: Every day | ORAL | Status: DC

## 2019-04-20 MED ORDER — OXYCODONE HCL ER 40 MG PO T12A: 80.0000 mg | EXTENDED_RELEASE_TABLET | Freq: Two times a day (BID) | ORAL | Status: DC | PRN

## 2019-04-20 NOTE — ED Provider Notes (Signed)
[facility] DEPT Provider Note   CSN: [id number] Arrival date & time: 04/20/19  8891     History   Chief Complaint Chief Complaint  Patient presents with  . Sickle Cell Pain Crisis    HPI Chelsea Hensley is a 73 y.o. 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  She reports insidious onset of her typical sickle cell pain in her lower back x 2 to 3 days ago.  She says this is the usual location for her pain.  She tried taking her medications at home but feels like it is not getting her pain under control.  Patient reports that she takes 30 mg of morphine extended release daily as maintenance, and also takes 80 mg of oxycodone as needed for breakthrough pain.  She states she only takes oxycodone when she is having severe pain, such as the past 2 to 3 days. She says is not helped her symptoms.  She denies any chest pain, cough, fevers, shortness of breath.  She wears 3 L nasal cannula home oxygen "as needed during her crisis" (according to her daughter)    HPI  Past Medical History:  Diagnosis Date  . Gout   . Hyperuricemia   . Sickle cell anemia (HCC)   . 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 Disorientation   . Acute on chronic respiratory failure with hypoxia ([place]) 05/26/2018  . CKD (chronic kidney disease) 05/26/2018  . Sickle cell crisis ([place]) 05/26/2018  . Sickle cell pain crisis ([place])   . Left arm pain 10/15/2017  . Gout 10/14/2017  . Left wrist pain 10/14/2017  . Dyspnea on exertion 12/24/2016  . Anemia 12/02/2015  . Sickle cell disease ([place]) 12/02/2015    Past Surgical History:  Procedure Laterality Date  . ABDOMINAL HYSTERECTOMY    . CATARACT EXTRACTION, BILATERAL    . CHOLECYSTECTOMY    . HERNIA REPAIR    . TOTAL ABDOMINAL HYSTERECTOMY W/ BILATERAL  SALPINGOOPHORECTOMY Bilateral      OB History   No obstetric history on file.      Home Medications    Prior to Admission medications   Medication Sig Start Date End Date Taking? Authorizing Provider  allopurinol (ZYLOPRIM) 300 MG tablet Take 300 mg by mouth daily.    [provider]  Deferasirox (JADENU) 180 MG TABS Take 180 mg by mouth daily. Takes 180 mg tab along with 2 320 mg tabs for a 900 mg dose    [provider]  Deferasirox (JADENU) 360 MG TABS Take 720 mg by mouth daily. Takes 180 mg tab along with 2 320 mg tabs for a 900 mg dose    [provider]  folic acid (FOLVITE) 1 MG tablet Take 1 tablet (1 mg total) by mouth daily. 01/24/16   Micheline Chapman, NP  furosemide (LASIX) 20 MG tablet Take 40 mg by mouth daily as needed for fluid.     [provider]  Multiple Vitamin (MULTIVITAMIN WITH MINERALS) TABS tablet Take 1 tablet by mouth daily.    [provider]  naloxone [facility]) nasal spray 4 mg/0.1 mL Place 0.4 mg into the nose once.  04/02/18   [provider]  oxyCODONE (OXYCONTIN) 80 mg 12 hr tablet Take 1 tablet (80 mg total) by mouth every 12 (twelve) hours  as needed (for severe pain). 01/24/16   Micheline Chapman, NP  oxyCODONE 10 MG TABS Take 1 tablet (10 mg total) by mouth every 4 (four) hours as needed for severe pain. Patient not taking: Reported on 05/25/2018 10/19/17   Tresa Garter, MD  oxyCODONE-Aspirin [phone] MG TABS Take 2 tablets by mouth 4 (four) times daily as needed for severe pain. 04/03/18   [provider]  predniSONE (DELTASONE) 20 MG tablet Take 3 tablets daily for 2 days, then 2 tablets daily x 2 days then 1 tab daily for 3 days Patient not taking: Reported on 05/25/2018 10/19/17   Tresa Garter, MD    Family History History reviewed. No pertinent family history.  Social History Social History   Tobacco Use  . Smoking status: Former Smoker    Packs/day: 1.00    Types:  Cigarettes    Quit date: 12/25/2006    Years since quitting: 12.3  . Smokeless tobacco: Never Used  . Tobacco comment: pt states that she was in her 56s when she started smoking; cannot remember exactly  Substance Use Topics  . Alcohol use: Yes    Comment: rare  . Drug use: No     Allergies   Quinolones   Review of Systems Review of Systems  Constitutional: Positive for chills and fatigue. Negative for fever.  Eyes: Negative for pain and visual disturbance.  Respiratory: Negative for cough and shortness of breath.   Cardiovascular: Negative for chest pain and palpitations.  Gastrointestinal: Negative for abdominal pain, nausea and vomiting.  Musculoskeletal: Positive for arthralgias, back pain and myalgias.  Skin: Negative for pallor and rash.  Neurological: Negative for syncope.  All other systems reviewed and are negative.    Physical Exam Updated Vital Signs BP (!) 128/56   Pulse 89   Temp (!) 97.5 F (36.4 C) (Oral)   Resp 17   Ht $R'5\' 3"'Hu$  (1.6 m)   Wt 63.5 kg   SpO2 98%   BMI 24.80 kg/m   Physical Exam Vitals signs and nursing note reviewed.  Constitutional:      General: She is not in acute distress.    Appearance: She is well-developed.  HENT:     Head: Normocephalic and atraumatic.  Eyes:     Conjunctiva/sclera: Conjunctivae normal.  Neck:     Musculoskeletal: Neck supple.  Cardiovascular:     Rate and Rhythm: Normal rate and regular rhythm.     Pulses: Normal pulses.  Pulmonary:     Effort: Pulmonary effort is normal. No respiratory distress.  Abdominal:     General: There is no distension.     Palpations: Abdomen is soft.     Tenderness: There is no abdominal tenderness.  Skin:    General: Skin is warm and dry.  Neurological:     General: No focal deficit present.     Mental Status: She is alert and oriented to person, place, and time.      ED Treatments / Results  Labs (all labs ordered are listed, but only abnormal results are  displayed) Labs Reviewed  COMPREHENSIVE METABOLIC PANEL - Abnormal; Notable for the following components:      Result Value   Chloride 112 (*)    CO2 16 (*)    Glucose, Bld 113 (*)    BUN 84 (*)    Creatinine, Ser 2.06 (*)    Total Protein 6.3 (*)    Total Bilirubin 8.5 (*)    GFR calc non  Af Amer 23 (*)    GFR calc Af Amer 27 (*)    All other components within normal limits  CBC WITH DIFFERENTIAL/PLATELET - Abnormal; Notable for the following components:   WBC 13.1 (*)    RBC 1.69 (*)    Hemoglobin 6.2 (*)    HCT 18.6 (*)    MCV 110.1 (*)    MCH 36.7 (*)    RDW 32.5 (*)    nRBC 196.4 (*)    Neutro Abs 8.9 (*)    Monocytes Absolute 2.4 (*)    Abs Immature Granulocytes 0.27 (*)    All other components within normal limits  RETICULOCYTES - Abnormal; Notable for the following components:   Retic Ct Pct 40.3 (*)    RBC. 1.69 (*)    Retic Count, Absolute 681.7 (*)    Immature Retic Fract 38.6 (*)    All other components within normal limits  PREPARE RBC (CROSSMATCH)  TYPE AND SCREEN    EKG None  Radiology No results found.  Procedures .Critical Care Performed by: Wyvonnia Dusky, MD Authorized by: Wyvonnia Dusky, MD   Critical care provider statement:    Critical care time (minutes):  40   Critical care was necessary to treat or prevent imminent or life-threatening deterioration of the following conditions:  Circulatory failure   Critical care was time spent personally by me on the following activities:  Discussions with consultants, evaluation of patient's response to treatment, examination of patient, ordering and performing treatments and interventions, ordering and review of laboratory studies, ordering and review of radiographic studies, pulse oximetry, re-evaluation of patient's condition, obtaining history from patient or surrogate and review of old charts Comments:     Blood transfusions for symptomatic anemia   (including critical care time)   Medications Ordered in ED Medications  HYDROmorphone (DILAUDID) injection 2 mg (2 mg Intravenous Given 04/20/19 1141)  0.9 %  sodium chloride infusion (has no administration in time range)  sodium chloride 0.9 % bolus 1,000 mL (1,000 mLs Intravenous New Bag/Given 04/20/19 1316)     Initial Impression / Assessment and Plan / ED Course  I have reviewed the triage vital signs and the nursing notes.  Pertinent labs & imaging results that were available during my care of the patient were reviewed by me and considered in my medical decision making (see chart for details).  73 yo female presenting here with sickle cell pain crisis x 4 days.  Typical pain pattern in back and legs.  Also having some SOB and "Coldness" in her fingers which she believes is indicative of needing a transfusion.  It appears she gets regular transfusions every 3 months or so.  She is not in acute respiratory distress.  Very low concern for acute chest syndrome or sepsis.    Will need to speak to pharmacy about converting her home meds  Clinical Course as of Apr 19 1453  Tue Apr 20, 2019  1125 I spoke to our pharmacist Larkin Ina and discussed the patient's pain regimen (morphine 30 mg every 4-6 hours daily), and oxycodone 80 mg BID PRN for breakthrough).  We found an equivalent dose of 2 mg dilaudid PRN Q2, whichw as ordered   [MT]  1206 After review of prior records, it appears the patient does eventually get blood transfusions every 2 to 3 months.  Level of 6.2 is lower than her baseline.  Believe it is reasonable to send a type and screen and transfuse 1 unit of blood  while she is here.   [MT]  9574 I spoke to the patient's daughter Dimple Nanas who reports the patient was in fact referred to the ED by her hematologist for blood transfusion, as she was symptomatic from her anemia and could not get an outpatient transfusion done.  Based on this information, leave it is reasonable to give the patient a total of 2 units of  packed red blood cells.  We will reassess her after transfusion.  If she is feeling well her daughter states she can come and pick her up and bring her home and keep an eye on her.   [MT]  1341 Informed by blood bank that the patient's blood products need to be shipped from [place].  This may take several hours.  Given this change in circumstances, I suspect the patient will need to be admitted for the transfusion   [MT]  1357 After speaking to the hospitalist, I reached out to [place] Hollis from the sickle cell program, reports she will admit for AKI and blood transfusions   [MT]    Clinical Course User Index [MT] Langston Masker, Carola Rhine, MD    Final Clinical Impressions(s) / ED Diagnoses   Final diagnoses:  Symptomatic anemia  Sickle cell pain crisis ([place])  AKI (acute kidney injury) [facility])    ED Discharge Orders    None       Wyvonnia Dusky, MD 04/20/19 [phone]

## 2019-04-20 NOTE — H&P (Signed)
H&P  Patient Demographics:  Chelsea Hensley, is a 73 y.o. female  MRN: [id number]   DOB - 1945-11-22  Admit Date - 04/20/2019  Outpatient Primary MD for the patient is Audley Hose, MD  Chief Complaint  Patient presents with  . Sickle Cell Pain Crisis      HPI:   Chelsea Hensley  is a 73 y.o. female with a medical history significant for sickle cell disease type SS, anemia of chronic disease, chronic kidney disease, gouty arthritis, acute on chronic hypoxia on home supplemental oxygen, chronic pain syndrome, and osteopenia presented to emergency department complaining of shortness of breath.  Patient states that she was in her usual state of health until 2-3 days prior.  Patient states that she reported to her primary care physician who advised her to report to emergency department for work-up and possible transfusion.  Patient states that she has been experiencing generalized pain over the past several days.  Pain is primarily to low back, lower extremities, and sternum.  Pain intensity is 8/10 characterized as constant and aching.  Patient says that she typically takes OxyContin 80 mg twice daily as needed.  She says that she does not like taking that particular medication and does not take it often.  Patient also takes oxycodone 20-30 mg every 6 hours as needed.  She states that her pain is not responding to home medications.  Patient says that she typically requires transfusions around every 3 to 6 months.  Her last transfusion 6 months ago.  Patient states that low back pain is worsened with ambulation and when transitioning from sitting to standing.  Patient denies sick contacts, recent travel, or exposure to COVID-19.  No fever, chills,  persistent cough, urinary symptoms, nausea, vomiting, or diarrhea.  Patient endorses shortness of breath with exertion.  She denies dizziness or paresthesias.  Emergency room course: Hemoglobin 6.3, which is below patient's baseline of 7-8 g/dL.  WBCs 13.1 and  platelets 305.  Creatinine 2.06, BUN 84, potassium 4.3, and total bilirubin 8.5.  Type and cross match done, patient has antibodies and is difficult to transfuse.  It will be later on this evening before phenotypically matched blood arrives.  Oxygen saturation 100% on 2 L of supplemental oxygen.  All other vital signs reassuring.  Patient admitted in observation for symptomatic anemia, warrants transfusion of 2 units of packed red blood cells and management of pain crisis.  Will admit to [employer].   Review of systems:  Review of Systems  Constitutional: Negative for chills and fever.  HENT: Negative.   Eyes: Negative.   Respiratory: Positive for shortness of breath. Negative for cough and sputum production.   Gastrointestinal: Negative.   Genitourinary: Negative.  Negative for frequency and hematuria.  Musculoskeletal: Positive for back pain and joint pain.  Skin: Negative.   Neurological: Negative.   Psychiatric/Behavioral: Negative.      A full 10 point Review of Systems was done, except as stated above, all other Review of Systems were negative.  With Past History of the following :   Past Medical History:  Diagnosis Date  . Gout   . Hyperuricemia   . Sickle cell anemia (HCC)   . 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 ABDOMINAL HYSTERECTOMY    . CATARACT EXTRACTION, BILATERAL    . CHOLECYSTECTOMY    . HERNIA REPAIR    .  TOTAL ABDOMINAL HYSTERECTOMY W/ BILATERAL SALPINGOOPHORECTOMY Bilateral      Social History:   Social History   Tobacco Use  . Smoking status: Former Smoker    Packs/day: 1.00    Types: Cigarettes    Quit date: 12/25/2006    Years since quitting: 12.3  . Smokeless tobacco: Never Used  . Tobacco comment: pt states that she was in her 43s when she started smoking; cannot remember exactly  Substance Use Topics  . Alcohol use: Yes    Comment:  rare     Lives - At home   Family History :   History reviewed. No pertinent family history.   Home Medications:   Prior to Admission medications   Medication Sig Start Date End Date Taking? Authorizing Provider  allopurinol (ZYLOPRIM) 300 MG tablet Take 300 mg by mouth daily.    [provider]  Deferasirox (JADENU) 180 MG TABS Take 180 mg by mouth daily. Takes 180 mg tab along with 2 320 mg tabs for a 900 mg dose    [provider]  Deferasirox (JADENU) 360 MG TABS Take 720 mg by mouth daily. Takes 180 mg tab along with 2 320 mg tabs for a 900 mg dose    [provider]  folic acid (FOLVITE) 1 MG tablet Take 1 tablet (1 mg total) by mouth daily. 01/24/16   Micheline Chapman, NP  furosemide (LASIX) 20 MG tablet Take 40 mg by mouth daily as needed for fluid.     [provider]  Multiple Vitamin (MULTIVITAMIN WITH MINERALS) TABS tablet Take 1 tablet by mouth daily.    [provider]  naloxone [facility]) nasal spray 4 mg/0.1 mL Place 0.4 mg into the nose once.  04/02/18   [provider]  oxyCODONE (OXYCONTIN) 80 mg 12 hr tablet Take 1 tablet (80 mg total) by mouth every 12 (twelve) hours as needed (for severe pain). 01/24/16   Micheline Chapman, NP  oxyCODONE 10 MG TABS Take 1 tablet (10 mg total) by mouth every 4 (four) hours as needed for severe pain. Patient not taking: Reported on 05/25/2018 10/19/17   Tresa Garter, MD  oxyCODONE-Aspirin [phone] MG TABS Take 2 tablets by mouth 4 (four) times daily as needed for severe pain. 04/03/18   [provider]  predniSONE (DELTASONE) 20 MG tablet Take 3 tablets daily for 2 days, then 2 tablets daily x 2 days then 1 tab daily for 3 days Patient not taking: Reported on 05/25/2018 10/19/17   Tresa Garter, MD     Allergies:   Allergies  Allergen Reactions  . Quinolones Anaphylaxis    All vitals and pressures dropped, blacked out and had seizures     Physical Exam:    Vitals:   Vitals:   04/20/19 1400 04/20/19 1500  BP: (!) 128/56 (!) 124/53  Pulse: 89 89  Resp: 17 13  Temp:    SpO2: 98% 93%   Physical Exam HENT:     Head: Normocephalic.     Mouth/Throat:     Mouth: Mucous membranes are moist.  Eyes:     Pupils: Pupils are equal, round, and reactive to light.  Neck:     Musculoskeletal: Normal range of motion.  Cardiovascular:     Rate and Rhythm: Normal rate and regular rhythm.     Pulses: Normal pulses.     Heart sounds: Normal heart sounds.  Pulmonary:     Effort: Pulmonary effort is normal.  Breath sounds: Normal breath sounds.  Abdominal:     General: Abdomen is flat. Bowel sounds are normal.  Musculoskeletal: Normal range of motion.  Skin:    General: Skin is warm.  Neurological:     General: No focal deficit present.     Mental Status: Mental status is at baseline.  Psychiatric:        Mood and Affect: Mood normal.        Behavior: Behavior normal. Behavior is cooperative.        Thought Content: Thought content normal.        Cognition and Memory: Cognition normal.      Data Review:   CBC Recent Labs  Lab 04/20/19 1020  WBC 13.1*  HGB 6.2*  HCT 18.6*  PLT 305  MCV 110.1*  MCH 36.7*  MCHC 33.3  RDW 32.5*  LYMPHSABS 1.4  MONOABS 2.4*  EOSABS 0.1  BASOSABS 0.1   ------------------------------------------------------------------------------------------------------------------  Chemistries  Recent Labs  Lab 04/20/19 1020  NA 137  K 4.3  CL 112*  CO2 16*  GLUCOSE 113*  BUN 84*  CREATININE 2.06*  CALCIUM 9.8  AST 28  ALT 24  ALKPHOS 111  BILITOT 8.5*   ------------------------------------------------------------------------------------------------------------------ estimated creatinine clearance is 21.8 mL/min (A) (by C-G formula based on SCr of 2.06 mg/dL (H)). ------------------------------------------------------------------------------------------------------------------ No results for  input(s): TSH, T4TOTAL, T3FREE, THYROIDAB in the last 72 hours.  Invalid input(s): FREET3  Coagulation profile No results for input(s): INR, PROTIME in the last 168 hours. ------------------------------------------------------------------------------------------------------------------- No results for input(s): DDIMER in the last 72 hours. -------------------------------------------------------------------------------------------------------------------  Cardiac Enzymes No results for input(s): CKMB, TROPONINI, MYOGLOBIN in the last 168 hours.  Invalid input(s): CK ------------------------------------------------------------------------------------------------------------------    Component Value Date/Time   BNP 690.0 (H) 05/27/2018 0403    ---------------------------------------------------------------------------------------------------------------  Urinalysis    Component Value Date/Time   COLORURINE YELLOW 05/29/2018 1559   APPEARANCEUR CLEAR 05/29/2018 1559   LABSPEC 1.009 05/29/2018 1559   PHURINE 5.0 05/29/2018 1559   GLUCOSEU NEGATIVE 05/29/2018 1559   HGBUR MODERATE (A) 05/29/2018 1559   BILIRUBINUR NEGATIVE 05/29/2018 1559   KETONESUR NEGATIVE 05/29/2018 1559   PROTEINUR 100 (A) 05/29/2018 1559   NITRITE NEGATIVE 05/29/2018 1559   LEUKOCYTESUR NEGATIVE 05/29/2018 1559    ----------------------------------------------------------------------------------------------------------------   Imaging Results:    No results found.   Assessment & Plan:  Principal Problem:   Symptomatic anemia Active Problems:   Sickle cell pain crisis (HCC)   Acute on chronic respiratory failure with hypoxia (HCC)   Acute-on-chronic kidney injury ([place])  Symptomatic anemia: Admit to MedSurg for management of symptomatic anemia.  Hemoglobin is 6.3, which is below patient's baseline of 7.0-8.0 g/dL.  Transfuse 2 units of packed red blood cells when they become available.  Continue  folic acid 1 mg daily.  Repeat CBC in a.m.  Sickle cell disease type SS with pain crisis: IV fluids, 0.45% saline at 50 mL/h 1-2 mg Dilaudid IV every 2 hours as needed for severe pain. OxyContin 80 mg every 12 hours as needed per home medication regimen. Reassess pain scale regularly.  Pain intensity will be evaluated in context of functioning in relationship to baseline as Ms. Camba's care progresses. Monitor vital signs closely Maintain oxygen saturation above 90% on 2 L supplemental oxygen  Chronic pain syndrome: Continue OxyContin 80 mg every 12 hours as needed.  OxyContin is typically scheduled every 12 hours.  Patient states that her primary provider prescribed medication this way being that she does not take  it on a daily basis, only as needed. Hold oxycodone, use Dilaudid as substitute  Leukocytosis: Mild leukocytosis.  WBCs 13.1.  Considered to be reactive at this juncture.  No antibiotics warranted at this time.  Patient afebrile.  Repeat CBC in a.m.  Acute on chronic kidney disease: Creatinine 2.06, above patient's baseline.  Gentle hydration.  Avoid nephrotoxic medications.  Repeat creatinine in a.m.  Chronic hypoxia: Continue 2 L supplemental oxygen  Gouty arthritis: Continue allopurinol    DVT Prophylaxis: SCDs  AM Labs Ordered, also please review Full Orders  Family Communication: Admission, patient's condition and plan of care including tests being ordered have been discussed with the patient who indicate understanding and agree with the plan and Code Status.  Code Status: Full Code  Consults called: None    Admission status: Inpatient    Time spent in minutes : 50 minutes  [place], MSN, FNP-C [facility] [address] [place], [place] [address] [phone]  04/20/2019 at 3:40 PM

## 2019-04-20 NOTE — ED Triage Notes (Addendum)
Patient reports she is in sickle cell crisis.  C/O pain bilateral leg pain and nausea 10/10 pain  Patient has home oxygen @ 3 liters.  Oxygen was out on arrival.    A/O Able to stand from wheelchair to stretcher.

## 2019-04-20 NOTE — ED Notes (Signed)
This RN notified by blood bank that pts blood products would not be available until likely this evening d/t having to have blood products shipped from [place].  EDP Trifan made aware.

## 2019-04-20 NOTE — ED Notes (Signed)
[facility] 6.2

## 2019-04-21 DIAGNOSIS — N184 Chronic kidney disease, stage 4 (severe): Secondary | ICD-10-CM | POA: Diagnosis present

## 2019-04-21 DIAGNOSIS — N179 Acute kidney failure, unspecified: Secondary | ICD-10-CM

## 2019-04-21 DIAGNOSIS — M8580 Other specified disorders of bone density and structure, unspecified site: Secondary | ICD-10-CM | POA: Diagnosis present

## 2019-04-21 DIAGNOSIS — D5700 Hb-SS disease with crisis, unspecified: Secondary | ICD-10-CM | POA: Diagnosis present

## 2019-04-21 DIAGNOSIS — Z90710 Acquired absence of both cervix and uterus: Secondary | ICD-10-CM | POA: Diagnosis not present

## 2019-04-21 DIAGNOSIS — M109 Gout, unspecified: Secondary | ICD-10-CM | POA: Diagnosis present

## 2019-04-21 DIAGNOSIS — G894 Chronic pain syndrome: Secondary | ICD-10-CM | POA: Diagnosis present

## 2019-04-21 DIAGNOSIS — Z9981 Dependence on supplemental oxygen: Secondary | ICD-10-CM | POA: Diagnosis not present

## 2019-04-21 DIAGNOSIS — J9621 Acute and chronic respiratory failure with hypoxia: Secondary | ICD-10-CM

## 2019-04-21 DIAGNOSIS — N189 Chronic kidney disease, unspecified: Secondary | ICD-10-CM

## 2019-04-21 DIAGNOSIS — Z79899 Other long term (current) drug therapy: Secondary | ICD-10-CM | POA: Diagnosis not present

## 2019-04-21 DIAGNOSIS — Z20828 Contact with and (suspected) exposure to other viral communicable diseases: Secondary | ICD-10-CM | POA: Diagnosis present

## 2019-04-21 DIAGNOSIS — D631 Anemia in chronic kidney disease: Secondary | ICD-10-CM | POA: Diagnosis present

## 2019-04-21 DIAGNOSIS — D72829 Elevated white blood cell count, unspecified: Secondary | ICD-10-CM | POA: Diagnosis present

## 2019-04-21 DIAGNOSIS — Z87891 Personal history of nicotine dependence: Secondary | ICD-10-CM | POA: Diagnosis not present

## 2019-04-21 LAB — CBC
HCT: 18.3 % — ABNORMAL LOW (ref 36.0–46.0)
HCT: 23 % — ABNORMAL LOW (ref 36.0–46.0)
Hemoglobin: 5.8 g/dL — CL (ref 12.0–15.0)
Hemoglobin: 7.4 g/dL — ABNORMAL LOW (ref 12.0–15.0)
MCH: 37.2 pg — ABNORMAL HIGH (ref 26.0–34.0)
MCH: 34.9 pg — ABNORMAL HIGH (ref 26.0–34.0)
MCHC: 31.7 g/dL (ref 30.0–36.0)
MCHC: 32.2 g/dL (ref 30.0–36.0)
MCV: 117.3 fL — ABNORMAL HIGH (ref 80.0–100.0)
MCV: 108.5 fL — ABNORMAL HIGH (ref 80.0–100.0)
Platelets: 213 10*3/uL (ref 150–400)
Platelets: 239 10*3/uL (ref 150–400)
RBC: 1.56 MIL/uL — ABNORMAL LOW (ref 3.87–5.11)
RBC: 2.12 MIL/uL — ABNORMAL LOW (ref 3.87–5.11)
RDW: 31.7 % — ABNORMAL HIGH (ref 11.5–15.5)
RDW: 28.6 % — ABNORMAL HIGH (ref 11.5–15.5)
WBC: 11.7 10*3/uL — ABNORMAL HIGH (ref 4.0–10.5)
WBC: 11.2 10*3/uL — ABNORMAL HIGH (ref 4.0–10.5)
nRBC: 253.8 % — ABNORMAL HIGH (ref 0.0–0.2)
nRBC: 32.05 % — ABNORMAL HIGH (ref 0.0–0.2)

## 2019-04-21 LAB — URIC ACID: Uric Acid, Serum: 9.1 mg/dL — ABNORMAL HIGH (ref 2.5–7.1)

## 2019-04-21 LAB — BASIC METABOLIC PANEL
Anion gap: 9 (ref 5–15)
BUN: 85 mg/dL — ABNORMAL HIGH (ref 8–23)
CO2: 16 mmol/L — ABNORMAL LOW (ref 22–32)
Calcium: 9.2 mg/dL (ref 8.9–10.3)
Chloride: 113 mmol/L — ABNORMAL HIGH (ref 98–111)
Creatinine, Ser: 2.12 mg/dL — ABNORMAL HIGH (ref 0.44–1.00)
GFR calc Af Amer: 26 mL/min — ABNORMAL LOW (ref >60)
GFR calc non Af Amer: 23 mL/min — ABNORMAL LOW (ref >60)
Glucose, Bld: 126 mg/dL — ABNORMAL HIGH (ref 70–99)
Potassium: 4.7 mmol/L (ref 3.5–5.1)
Sodium: 138 mmol/L (ref 135–145)

## 2019-04-21 LAB — FERRITIN: Ferritin: 1001 ng/mL — ABNORMAL HIGH (ref 11–307)

## 2019-04-21 LAB — MAGNESIUM: Magnesium: 2.5 mg/dL — ABNORMAL HIGH (ref 1.7–2.4)

## 2019-04-21 LAB — PHOSPHORUS: Phosphorus: 6.1 mg/dL — ABNORMAL HIGH (ref 2.5–4.6)

## 2019-04-21 MED ORDER — ALLOPURINOL 100 MG PO TABS
50.0000 mg | ORAL_TABLET | ORAL | Status: DC
  Administered 2019-04-21: 50 mg via ORAL
  Filled 2019-04-21: qty 1

## 2019-04-21 MED ORDER — DIPHENHYDRAMINE HCL 25 MG PO CAPS
25.0000 mg | ORAL_CAPSULE | Freq: Once | ORAL | Status: AC
  Administered 2019-04-21: 25 mg via ORAL
  Filled 2019-04-21: qty 1

## 2019-04-21 MED ORDER — DIPHENHYDRAMINE HCL 25 MG PO CAPS: 25.0000 mg | ORAL_CAPSULE | Freq: Once | ORAL | Status: DC

## 2019-04-21 MED ORDER — ACETAMINOPHEN 325 MG PO TABS
650.0000 mg | ORAL_TABLET | Freq: Once | ORAL | Status: AC
  Administered 2019-04-21: 650 mg via ORAL
  Filled 2019-04-21: qty 2

## 2019-04-21 MED ORDER — ACETAMINOPHEN 325 MG PO TABS: 650.0000 mg | ORAL_TABLET | Freq: Once | ORAL | Status: DC

## 2019-04-21 NOTE — Progress Notes (Signed)
Pt has voided once today for 400 ml tea colored urine. Pt has been encouraged to drink more.   Chelsea Hensley was notified.

## 2019-04-21 NOTE — Progress Notes (Signed)
Subjective: Chelsea Hensley, a 73 year old female with a medical history significant for sickle cell disease type SS, anemia of chronic disease with symptomatic anemia, chronic kidney disease, gouty arthritis, acute on chronic kidney hypoxia on home oxygen, chronic pain syndrome, opiate dependence and tolerance, and osteopenia was admitted with symptomatic anemia in the setting of sickle cell pain crisis.   Hemoglobin is 5.8 on today. Patient was admitted with symptomatic anemia and was to receive 2 units of packed red blood cells. Arrival of blood product was delayed due to patient having multiple antibodies. Patient has been difficult to match in the past.   Patient says that pain intensity has improved on today.  Pain is rated 6/10 and is primarily to low back. Patient also says that shortness of breath has improved, she feels as if she is breathing better on today.  Patient continues on 2 L supplemental oxygen.  She denies headache, dizziness, blurred vision, chest pain, heart palpitations, nausea, vomiting, or diarrhea.    Objective:  Vital signs in last 24 hours:  Vitals:   04/20/19 1600 04/20/19 1657 04/20/19 2037 04/21/19 0559  BP: 131/65 (!) 117/57 (!) 112/55 (!) 102/51  Pulse: 91 85 88 88  Resp: $Remo'13 12 16 16  'EiClF$ Temp:  97.7 F (36.5 C) (!) 97.4 F (36.3 C) 98.3 F (36.8 C)  TempSrc:  Oral Oral Oral  SpO2: 94% 99% 96% 98%  Weight:      Height:        Intake/Output from previous day:   Intake/Output Summary (Last 24 hours) at 04/21/2019 1002 Last data filed at 04/21/2019 0300 Gross per 24 hour  Intake 1400 ml  Output -  Net 1400 ml  Physical Exam Constitutional:      Appearance: Normal appearance.  HENT:     Mouth/Throat:     Mouth: Mucous membranes are moist.  Eyes:     General: No scleral icterus.    Pupils: Pupils are equal, round, and reactive to light.  Cardiovascular:     Rate and Rhythm: Rhythm irregular.     Pulses: Normal pulses.     Heart sounds: Murmur  present.  Pulmonary:     Effort: No respiratory distress.  Abdominal:     General: Bowel sounds are normal.  Musculoskeletal: Normal range of motion.  Neurological:     General: No focal deficit present.     Mental Status: She is alert. Mental status is at baseline.     Motor: Weakness present.  Psychiatric:        Mood and Affect: Mood normal.        Behavior: Behavior normal.        Thought Content: Thought content normal.        Judgment: Judgment normal.     Lab Results:  Basic Metabolic Panel:    Component Value Date/Time   NA 138 04/21/2019 0522   K 4.7 04/21/2019 0522   CL 113 (H) 04/21/2019 0522   CO2 16 (L) 04/21/2019 0522   BUN 85 (H) 04/21/2019 0522   CREATININE 2.12 (H) 04/21/2019 0522   CREATININE 1.16 (H) 01/12/2016 1007   GLUCOSE 126 (H) 04/21/2019 0522   CALCIUM 9.2 04/21/2019 0522   CBC:    Component Value Date/Time   WBC 11.7 (H) 04/21/2019 0522   HGB 5.8 (LL) 04/21/2019 0522   HCT 18.3 (L) 04/21/2019 0522   PLT 213 04/21/2019 0522   MCV 117.3 (H) 04/21/2019 0522   NEUTROABS 8.9 (H) 04/20/2019 1020  LYMPHSABS 1.4 04/20/2019 1020   MONOABS 2.4 (H) 04/20/2019 1020   EOSABS 0.1 04/20/2019 1020   BASOSABS 0.1 04/20/2019 1020    Recent Results (from the past 240 hour(s))  SARS CORONAVIRUS 2 (TAT 6-24 HRS) Nasopharyngeal Nasopharyngeal Swab     Status: None   Collection Time: 04/20/19  4:03 PM   Specimen: Nasopharyngeal Swab  Result Value Ref Range Status   SARS Coronavirus 2 NEGATIVE NEGATIVE Final    Comment: (NOTE) SARS-CoV-2 target nucleic acids are NOT DETECTED. The SARS-CoV-2 RNA is generally detectable in upper and lower respiratory specimens during the acute phase of infection. Negative results do not preclude SARS-CoV-2 infection, do not rule out co-infections with other pathogens, and should not be used as the sole basis for treatment or other patient management decisions. Negative results must be combined with clinical  observations, patient history, and epidemiological information. The expected result is Negative. Fact Sheet for Patients: SugarRoll.be Fact Sheet for Healthcare Providers: https://www.woods-mathews.com/ This test is not yet approved or cleared by the [place] FDA and  has been authorized for detection and/or diagnosis of SARS-CoV-2 by FDA under an Emergency Use Authorization (EUA). This EUA will remain  in effect (meaning this test can be used) for the duration of the COVID-19 declaration under Section 56 4(b)(1) of the Act, 21 U.S.C. section 360bbb-3(b)(1), unless the authorization is terminated or revoked sooner. Performed at [facility], [place] [address], [place], [place] [address]     Studies/Results: No results found.  Medications: Scheduled Meds: . allopurinol  50 mg Oral QODAY  . folic acid  1 mg Oral Daily  . multivitamin with minerals  1 tablet Oral Daily   Continuous Infusions: . sodium chloride 50 mL/hr at 04/20/19 1858  . sodium chloride     PRN Meds:.furosemide, HYDROmorphone (DILAUDID) injection, oxyCODONE  Consultants:  None  Procedures:  None  Antibiotics:  None  Assessment/Plan: Principal Problem:   Symptomatic anemia Active Problems:   Sickle cell pain crisis (HCC)   Acute on chronic respiratory failure with hypoxia (HCC)   Acute-on-chronic kidney injury (HCC)  Symptomatic anemia:  Hemoglobin decreased to 5.8 on today. Patient's blood was difficult to match due to multiple allo antibodies. Blood has arrived this am and 2 units will be administered followed by post transfusion H&H.   Sickle cell disease type SS with crisis:  Continue IV fluids at [facility] Dilaudid 0.5-1 mg every 2 hours as needed for severe breakthrough pain.  Oxycontin 80 mg as needed per home medication regimen.  Reassess pain scale regularly. Monitor vital signs closely  Chronic pain syndrome:  Continue home medications  with caution.   Leukocytosis: Mild leukocytosis. Considered to be reactive. Patient afebrile. No antibiotics warranted at this time  Acute on chronic kidney disease:  Patient's creatinine is worsening. Medication adjustments are warranted at this juncture. Will hold iron chelator and check ferritin level to see if patient warrants chelation.Desferol is contraindication with GFR <30.  Patient's GFR is 26, allopurinol dose is too high. Literature states that patient's with a GFR <30 should be on 50 mg every other day.  Will continue gentle hydration and avoid all nephrotoxic medications. Review magnesium and phosphorous as results become available  Chronic hypoxia:  Continue 2 liters supplemental oxygen Continuous pulse oximetry  Gouty arthritis:  Decrease allopurinol to 50 mg every other day due to worsening kidney functioning.  Evaluate uric acid level as results become available.     Code Status: Full Code Family Communication:  N/A Disposition Plan: Not yet ready for discharge  Guadalupe, MSN, FNP-C [facility] [address] [place], [place] [address] [phone]  If 5PM-7AM, please contact night-coverage.  04/21/2019, 10:02 AM  LOS: 0 days

## 2019-04-21 NOTE — Plan of Care (Signed)
  Problem: Activity: Goal: Risk for activity intolerance will decrease Outcome: Progressing   Problem: Pain Managment: Goal: General experience of comfort will improve Outcome: Progressing   Problem: Safety: Goal: Ability to remain free from injury will improve Outcome: Progressing   

## 2019-04-22 DIAGNOSIS — R0600 Dyspnea, unspecified: Secondary | ICD-10-CM

## 2019-04-22 LAB — CBC
HCT: 25.5 % — ABNORMAL LOW (ref 36.0–46.0)
Hemoglobin: 8 g/dL — ABNORMAL LOW (ref 12.0–15.0)
MCH: 35.6 pg — ABNORMAL HIGH (ref 26.0–34.0)
MCHC: 31.4 g/dL (ref 30.0–36.0)
MCV: 113.3 fL — ABNORMAL HIGH (ref 80.0–100.0)
Platelets: 167 10*3/uL (ref 150–400)
RBC: 2.25 MIL/uL — ABNORMAL LOW (ref 3.87–5.11)
RDW: 29.3 % — ABNORMAL HIGH (ref 11.5–15.5)
WBC: 8 10*3/uL (ref 4.0–10.5)
nRBC: 385.6 % — ABNORMAL HIGH (ref 0.0–0.2)

## 2019-04-22 LAB — BASIC METABOLIC PANEL
Anion gap: 10 (ref 5–15)
BUN: 84 mg/dL — ABNORMAL HIGH (ref 8–23)
CO2: 16 mmol/L — ABNORMAL LOW (ref 22–32)
Calcium: 9.1 mg/dL (ref 8.9–10.3)
Chloride: 112 mmol/L — ABNORMAL HIGH (ref 98–111)
Creatinine, Ser: 1.8 mg/dL — ABNORMAL HIGH (ref 0.44–1.00)
GFR calc Af Amer: 32 mL/min — ABNORMAL LOW (ref >60)
GFR calc non Af Amer: 27 mL/min — ABNORMAL LOW (ref >60)
Glucose, Bld: 111 mg/dL — ABNORMAL HIGH (ref 70–99)
Potassium: 5.8 mmol/L — ABNORMAL HIGH (ref 3.5–5.1)
Sodium: 138 mmol/L (ref 135–145)

## 2019-04-22 MED ORDER — ALLOPURINOL 100 MG PO TABS: 50.0000 mg | ORAL_TABLET | Freq: Every day | ORAL | 1 refills | Status: AC

## 2019-04-22 NOTE — Discharge Instructions (Signed)
Recommend that you follow up with your PCP within 1 week. If your hemoglobin is < 7.0. We can transfuse you as an outpatient at [facility] [phone]. Please refer to your medication list for changes.   Chronic Kidney Disease, Adult Chronic kidney disease (CKD) happens when the kidneys are damaged over a long period of time. The kidneys are two organs that help with:  Getting rid of waste and extra fluid from the blood.  Making hormones that maintain the amount of fluid in your tissues and blood vessels.  Making sure that the body has the right amount of fluids and chemicals. Most of the time, CKD does not go away, but it can usually be controlled. Steps must be taken to slow down the kidney damage or to stop it from getting worse. If this is not done, the kidneys may stop working. Follow these instructions at home: Medicines  Take over-the-counter and prescription medicines only as told by your doctor. You may need to change the amount of medicines you take.  Do not take any new medicines unless your doctor says it is okay. Many medicines can make your kidney damage worse.  Do not take any vitamin and supplements unless your doctor says it is okay. Many vitamins and supplements can make your kidney damage worse. General instructions  Follow a diet as told by your doctor. You may need to stay away from: ? Alcohol. ? Salty foods. ? Foods that are high in:  Potassium.  Calcium.  Protein.  Do not use any products that contain nicotine or tobacco, such as cigarettes and e-cigarettes. If you need help quitting, ask your doctor.  Keep track of your blood pressure at home. Tell your doctor about any changes.  If you have diabetes, keep track of your blood sugar as told by your doctor.  Try to stay at a healthy weight. If you need help, ask your doctor.  Exercise at least 30 minutes a day, 5 days a week.  Stay up-to-date with your shots (immunizations) as told  by your doctor.  Keep all follow-up visits as told by your doctor. This is important. Contact a doctor if:  Your symptoms get worse.  You have new symptoms. Get help right away if:  You have symptoms of end-stage kidney disease. These may include: ? Headaches. ? Numbness in your hands or feet. ? Easy bruising. ? Having hiccups often. ? Chest pain. ? Shortness of breath. ? Stopping of menstrual periods in women.  You have a fever.  You have very little pee (urine).  You have pain or bleeding when you pee. Summary  Chronic kidney disease (CKD) happens when the kidneys are damaged over a long period of time.  Most of the time, this condition does not go away, but it can usually be controlled. Steps must be taken to slow down the kidney damage or to stop it from getting worse.  Treatment may include a combination of medicines and lifestyle changes. This information is not intended to replace advice given to you by your health care provider. Make sure you discuss any questions you have with your health care provider. Document Released: 07/31/2009 Document Revised: 04/18/2017 Document Reviewed: 06/10/2016 Elsevier Patient Education  2020 [place]. Sickle Cell Anemia, Adult  Sickle cell anemia is a condition where your red blood cells are shaped like sickles. Red blood cells carry oxygen through the body. Sickle-shaped cells do not live as long as normal red blood cells. They  also clump together and block blood from flowing through the blood vessels. This prevents the body from getting enough oxygen. Sickle cell anemia causes organ damage and pain. It also increases the risk of infection. Follow these instructions at home: Medicines  Take over-the-counter and prescription medicines only as told by your doctor.  If you were prescribed an antibiotic medicine, take it as told by your doctor. Do not stop taking the antibiotic even if you start to feel better.  If you develop a  fever, do not take medicines to lower the fever right away. Tell your doctor about the fever. Managing pain, stiffness, and swelling  Try these methods to help with pain: ? Use a heating pad. ? Take a warm bath. ? Distract yourself, such as by watching TV. Eating and drinking  Drink enough fluid to keep your pee (urine) clear or pale yellow. Drink more in hot weather and during exercise.  Limit or avoid alcohol.  Eat a healthy diet. Eat plenty of fruits, vegetables, whole grains, and lean protein.  Take vitamins and supplements as told by your doctor. Traveling  When traveling, keep these with you: ? Your medical information. ? The names of your doctors. ? Your medicines.  If you need to take an airplane, talk to your doctor first. Activity  Rest often.  Avoid exercises that make your heart beat much faster, such as jogging. General instructions  Do not use products that have nicotine or tobacco, such as cigarettes and e-cigarettes. If you need help quitting, ask your doctor.  Consider wearing a medical alert bracelet.  Avoid being in high places (high altitudes), such as mountains.  Avoid very hot or cold temperatures.  Avoid places where the temperature changes a lot.  Keep all follow-up visits as told by your doctor. This is important. Contact a doctor if:  A joint hurts.  Your feet or hands hurt or swell.  You feel tired (fatigued). Get help right away if:  You have symptoms of infection. These include: ? Fever. ? Chills. ? Being very tired. ? Irritability. ? Poor eating. ? Throwing up (vomiting).  You feel dizzy or faint.  You have new stomach pain, especially on the left side.  You have a an erection (priapism) that lasts more than 4 hours.  You have numbness in your arms or legs.  You have a hard time moving your arms or legs.  You have trouble talking.  You have pain that does not go away when you take medicine.  You are short of  breath.  You are breathing fast.  You have a long-term cough.  You have pain in your chest.  You have a bad headache.  You have a stiff neck.  Your stomach looks bloated even though you did not eat much.  Your skin is pale.  You suddenly cannot see well. Summary  Sickle cell anemia is a condition where your red blood cells are shaped like sickles.  Follow your doctor's advice on ways to manage pain, food to eat, activities to do, and steps to take for safe travel.  Get medical help right away if you have any signs of infection, such as a fever. This information is not intended to replace advice given to you by your health care provider. Make sure you discuss any questions you have with your health care provider. Document Released: 02/24/2013 Document Revised: 08/28/2018 Document Reviewed: 06/11/2016 Elsevier Patient Education  2020 [employer].

## 2019-04-22 NOTE — Discharge Summary (Addendum)
Physician Discharge Summary  [name] ATF:[id number] DOB: 26-Mar-1946 DOA: 04/20/2019  PCP: Audley Hose, MD  Admit date: 04/20/2019  Discharge date: 04/22/2019  Discharge Diagnoses:  Principal Problem:   Symptomatic anemia Active Problems:   Sickle cell pain crisis ([place])   Acute on chronic respiratory failure with hypoxia ([place])   Acute-on-chronic kidney injury ([place])   AKI (acute kidney injury) ([place])   Discharge Condition: Stable  Disposition:  Follow-up Information    Hensley, Chelsea B, MD Follow up in 1 week(s).   Specialty: Internal Medicine Why: Repeat CBC and BMP.  Hemoglobin at discharge is 7.1 and creatine has improved to 1.8.  Contact information: [place] [address] [phone]          Pt is discharged home in good condition and is to follow up with Chelsea Hensley B, MD this week to have labs evaluated. Chelsea Hensley is instructed to increase activity slowly and balance with rest for the next few days, and use prescribed medication to complete treatment of pain  Diet: Regular Wt Readings from Last 3 Encounters:  04/20/19 63.5 kg  05/26/18 65 kg  10/19/17 63.6 kg    History of present illness:  Chelsea Hensley is a 73 year old female with a medical history significant for sickle cell disease type SS, anemia of chronic disease, chronic kidney disease, gouty arthritis, acute on chronic hypoxia on home supplemental oxygen, chronic pain syndrome, and osteopenia presented to ER complaining of shortness of breath.  Patient states that she was in her usual state of health until 2-3 days prior.  Patient states that she reported to her primary care physician who advised her to come to the emergency department for work-up and possible transfusion.  Patient states that she has been experience generalized pain all over for the past several days.  Pain is primarily to low back, lower extremities, and sternum.  Pain intensity is 8/10  characterized as constant and aching.  Patient says that she typically takes OxyContin 80 mg twice daily as needed.  She says that she does not like taking that particular medication and does not take it often.  Also, patient takes oxycodone 20 to 30 mg every 6 hours as needed.  She says that her pain is not responding to home medications.  She also states that she typically requires transfusions around every 3 to 6 months.  Her last transfusion was 6 months ago.  Patient states that low back pain has worsened with ambulation and when transitioning from sitting to standing.  She denies sick contacts, recent travel, or exposure to COVID-19.  No fever, chills, persistent cough, urinary symptoms, nausea, vomiting, or diarrhea.  Patient endorses shortness of breath with exertion.  She denies dizziness or paresthesias.  ER course: Hemoglobin is 6.3, which is below patient's baseline of 7-8 g/dL.  WBCs 13.1 and platelets 305.  Creatinine 2.06, BUN 84, potassium 4.3, and total bilirubin 8.5.  Type and crossmatch done, patient has antibodies and is difficult to transfuse.  It will be later this evening before phenotypically matched units of packed red blood cells arrive.  Oxygen saturation is 100% on 2 L supplemental oxygen.  All other vital signs are reassuring.  Patient admitted in observation for symptomatic anemia, warrants transfusion of 2 units of packed red blood cells and management of pain crisis.  Will admit to [facility].  Hospital Course:  Symptomatic anemia: On admission, hemoglobin 6.3 which was below patient's baseline.  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  Nadir 5.4 g/dL prior to blood transfusion.  Compatible unit was located and patient was transfused 1 unit of PRBCs.  Hemoglobin returned to baseline of 7.1. Patient says that pain has improved.  Pain intensity is 4-5/10, primarily to lower back.  Patient states that she can manage pain on home medication  regimen. Discussed patient's medication regimen at length.  Patient is highly opiate tolerant and is on high doses of opiates including OxyContin 80 mg every 12 hours as needed and MSIR 30-60 mg every 6 hours for severe pain.  Recommend that patient schedule first available appointment for medication management with her PCP.  Patient may benefit from lower doses of medications with increased frequency.  We discussed her medications at length, changes have been made to medication list.  Her daughter is also aware.  Acute on chronic kidney disease: Patient has stage IV chronic kidney disease.  She is unsure of whether she has had nephrologist.  Recommend referral to nephrology.  On admission, creatinine was 2.06.  Discontinued deferasirox due to the fact that is contraindicated with GFR less than 30.  Review ferritin level, greater than 1000.  PCP can reevaluate ferritin and creatinine going forward to find if patient can restart iron chelator.  Also, patient has continued on allopurinol 300 mg daily, which has not been renally adjusted.  While in hospital, dose was decreased to 50 mg every other day per literature.  Reviewed uric acid level, which is elevated at 9.5.  Restarted allopurinol at 50 mg daily.   Patient alert, oriented, and ambulating with minimal assistance.  Oxygen saturation is 98% on 2 L.  Discussed medication regimen at length with patient and her daughter, they expressed understanding.  Patient will schedule follow-up with PCP in 1 week to repeat labs.  Also, recommend referrals to both nephrology and hematology.   Patient was discharged home today in a hemodynamically stable condition.   Discharge Exam: Vitals:   04/22/19 0533 04/22/19 1338  BP: (!) 134/55 (!) 103/45  Pulse: 97 86  Resp: 16 15  Temp: 99.5 F (37.5 C) 98.9 F (37.2 C)  SpO2: 99% 100%   Vitals:   04/21/19 1504 04/21/19 2102 04/22/19 0533 04/22/19 1338  BP: 112/66 (!) 118/57 (!) 134/55 (!) 103/45  Pulse: 93  88 97 86  Resp: 16 17 16 15   Temp: 97.7 F (36.5 C) 98.2 F (36.8 C) 99.5 F (37.5 C) 98.9 F (37.2 C)  TempSrc: Oral Oral Oral Oral  SpO2: 100% 100% 99% 100%  Weight:      Height:        General appearance : Awake, alert, not in any distress. Speech Clear. Not toxic looking HEENT: Atraumatic and Normocephalic, pupils equally reactive to light and accomodation Neck: Supple, no JVD. No cervical lymphadenopathy.  Chest: Good air entry bilaterally, no added sounds  CVS: Irregular, murmur present.  Abdomen: Bowel sounds present, Non tender and not distended with no gaurding, rigidity or rebound. Extremities: B/L Lower Ext shows no edema, both legs are warm to touch Neurology: Abnormal gait.  Awake alert, and oriented X 3, CN II-XII intact, Non focal Skin: No Rash  Discharge Instructions  Discharge Instructions    Discharge patient   Complete by: As directed    Discharge disposition: 01-Home or Self Care   Discharge patient date: 04/22/2019     Allergies as of 04/22/2019      Reactions   Quinolones Anaphylaxis   All  vitals and pressures dropped, blacked out and had seizures      Medication List    STOP taking these medications   Jadenu 360 MG Tabs Generic drug: Deferasirox   morphine 30 MG tablet Commonly known as: MSIR   predniSONE 20 MG tablet Commonly known as: DELTASONE     TAKE these medications   allopurinol 100 MG tablet Commonly known as: ZYLOPRIM Take 0.5 tablets (50 mg total) by mouth daily. What changed:   medication strength  how much to take   folic acid 1 MG tablet Commonly known as: FOLVITE Take 1 tablet (1 mg total) by mouth daily.   furosemide 20 MG tablet Commonly known as: LASIX Take 40 mg by mouth daily as needed for fluid.   multivitamin with minerals Tabs tablet Take 1 tablet by mouth daily.   Narcan 4 MG/0.1ML Liqd nasal spray kit Generic drug: naloxone Place 0.4 mg into the nose once.   oxyCODONE 80 mg 12 hr tablet Commonly  known as: OxyCONTIN Take 1 tablet (80 mg total) by mouth every 12 (twelve) hours as needed (for severe pain). What changed: Another medication with the same name was removed. Continue taking this medication, and follow the directions you see here.       The results of significant diagnostics from this hospitalization (including imaging, microbiology, ancillary and laboratory) are listed below for reference.    Significant Diagnostic Studies: No results found.  Microbiology: Recent Results (from the past 240 hour(s))  SARS CORONAVIRUS 2 (TAT 6-24 HRS) Nasopharyngeal Nasopharyngeal Swab     Status: None   Collection Time: 04/20/19  4:03 PM   Specimen: Nasopharyngeal Swab  Result Value Ref Range Status   SARS Coronavirus 2 NEGATIVE NEGATIVE Final    Comment: (NOTE) SARS-CoV-2 target nucleic acids are NOT DETECTED. The SARS-CoV-2 RNA is generally detectable in upper and lower respiratory specimens during the acute phase of infection. Negative results do not preclude SARS-CoV-2 infection, do not rule out co-infections with other pathogens, and should not be used as the sole basis for treatment or other patient management decisions. Negative results must be combined with clinical observations, patient history, and epidemiological information. The expected result is Negative. Fact Sheet for Patients: SugarRoll.be Fact Sheet for Healthcare Providers: https://www.woods-mathews.com/ This test is not yet approved or cleared by the [place] FDA and  has been authorized for detection and/or diagnosis of SARS-CoV-2 by FDA under an Emergency Use Authorization (EUA). This EUA will remain  in effect (meaning this test can be used) for the duration of the COVID-19 declaration under Section 56 4(b)(1) of the Act, 21 U.S.C. section 360bbb-3(b)(1), unless the authorization is terminated or revoked sooner. Performed at [facility], [place]  [address] [place], [place] [address]      Labs: Basic Metabolic Panel: Recent Labs  Lab 04/20/19 1020 04/21/19 0522 04/21/19 1059 04/22/19 1331  NA 137 138  --  138  K 4.3 4.7  --  5.8*  CL 112* 113*  --  112*  CO2 16* 16*  --  16*  GLUCOSE 113* 126*  --  111*  BUN 84* 85*  --  84*  CREATININE 2.06* 2.12*  --  1.80*  CALCIUM 9.8 9.2  --  9.1  MG  --   --  2.5*  --   PHOS  --   --  6.1*  --    Liver Function Tests: Recent Labs  Lab 04/20/19 1020  AST 28  ALT 24  ALKPHOS 111  BILITOT 8.5*  PROT 6.3*  ALBUMIN 4.0   No results for input(s): LIPASE, AMYLASE in the last 168 hours. No results for input(s): AMMONIA in the last 168 hours. CBC: Recent Labs  Lab 04/20/19 1020 04/21/19 0522 04/21/19 1631 04/22/19 1331  WBC 13.1* 11.7* 11.2* 8.0  NEUTROABS 8.9*  --   --   --   HGB 6.2* 5.8* 7.4* 8.0*  HCT 18.6* 18.3* 23.0* 25.5*  MCV 110.1* 117.3* 108.5* 113.3*  PLT 305 213 239 167   Cardiac Enzymes: No results for input(s): CKTOTAL, CKMB, CKMBINDEX, TROPONINI in the last 168 hours. BNP: Invalid input(s): POCBNP CBG: No results for input(s): GLUCAP in the last 168 hours.  Time coordinating discharge: 50 minutes  Signed:  Donia Pounds  APRN, MSN, FNP-C [facility] [address] [place], [place] [address] [phone]  [facility] 04/22/2019, 3:34 PM

## 2019-04-24 LAB — TYPE AND SCREEN
ABO/RH(D): B POS
Antibody Screen: NEGATIVE
Unit division: 0
Unit division: 0
Unit division: 0

## 2019-04-24 LAB — BPAM RBC
Blood Product Expiration Date: 202012222359
Blood Product Expiration Date: 202012232359
Blood Product Expiration Date: 202101112359
ISSUE DATE / TIME: 202012021033
Unit Type and Rh: 5100
Unit Type and Rh: 7300
Unit Type and Rh: 7300

## 2019-05-11 IMAGING — CR DG CHEST 2V
2 series · 2 of 2 positions shown · non-contrast
Comparison: 09/05/2016

CLINICAL DATA: Left arm pain, swelling, shortness of breath

EXAM:
CHEST - 2 VIEW

[w chest lat]
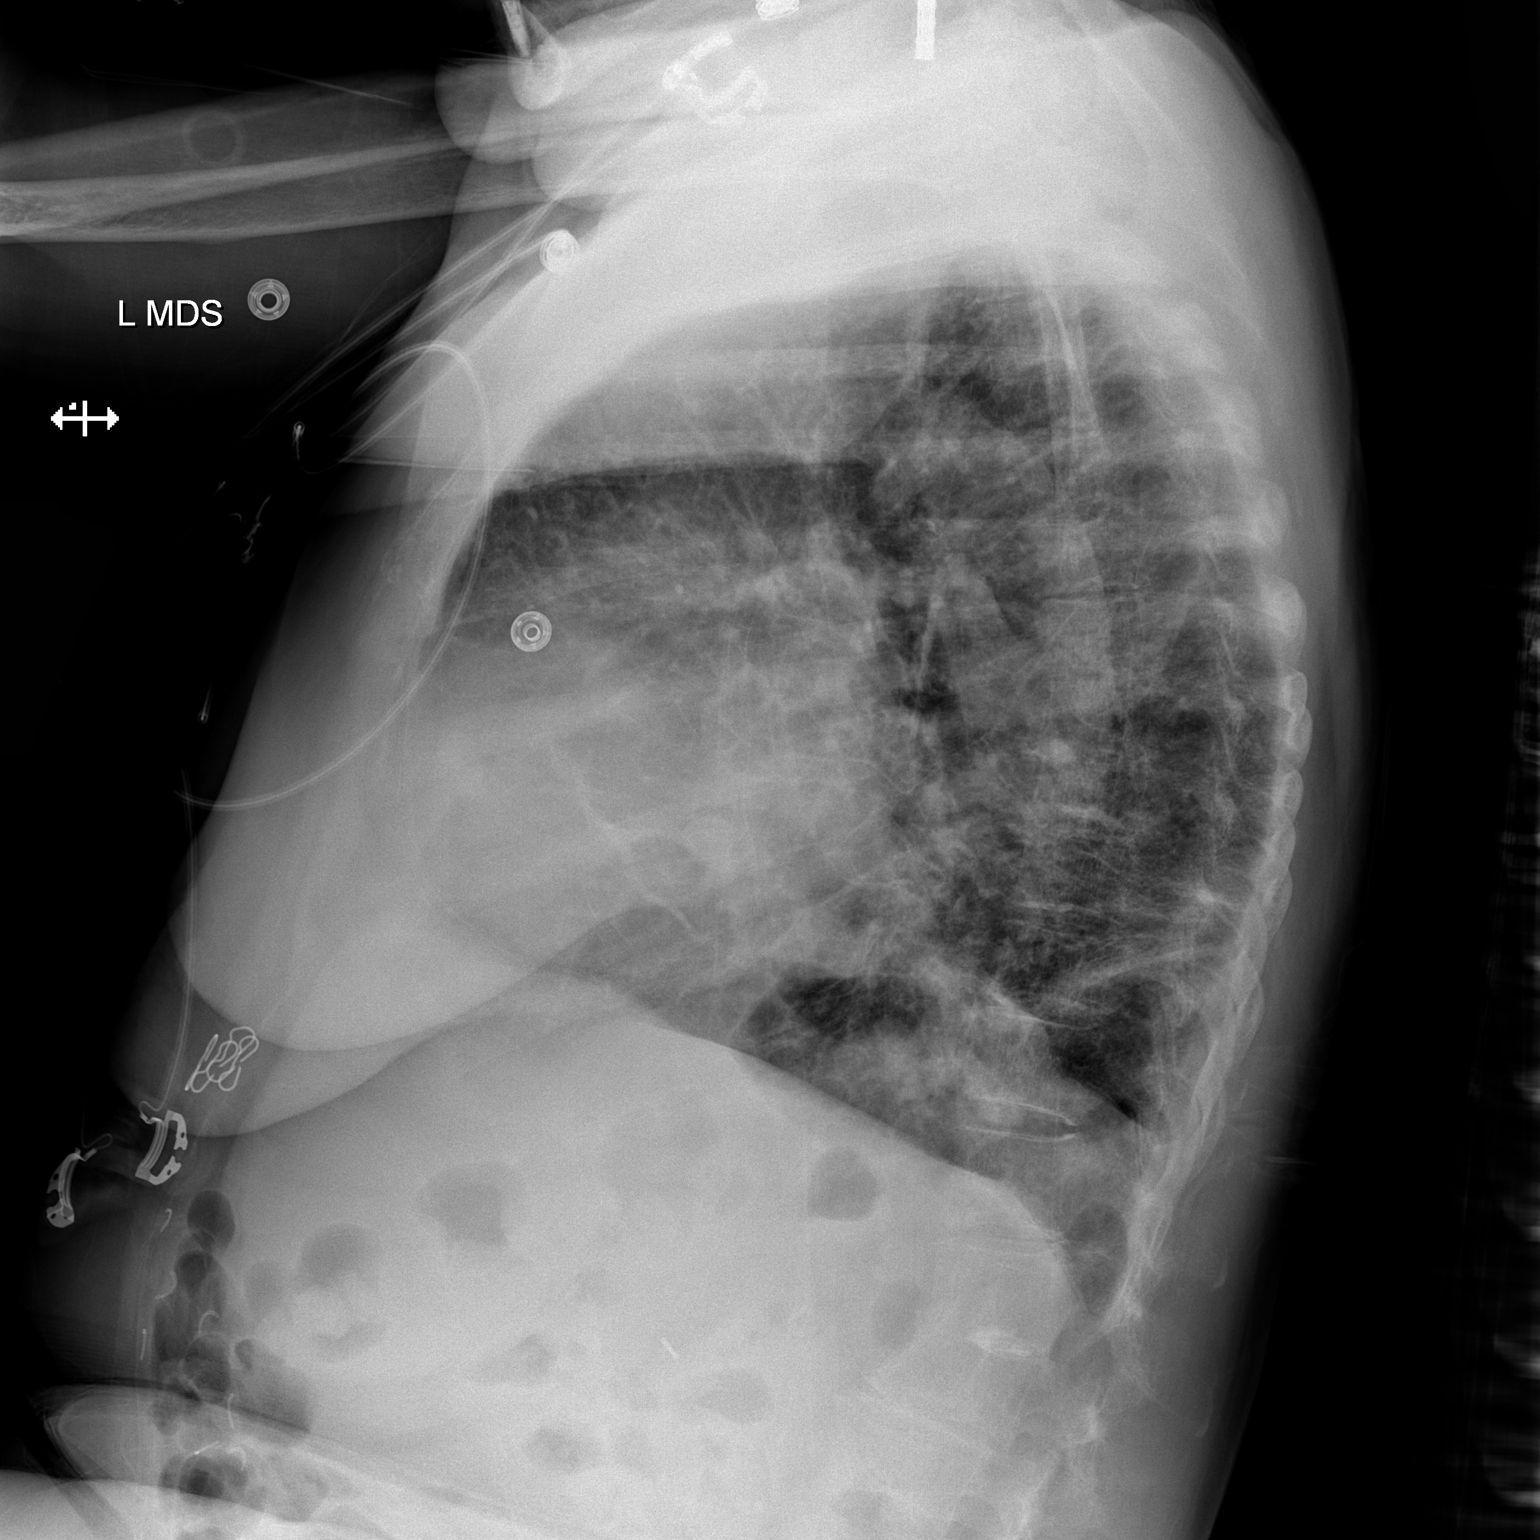

[x chest ap]
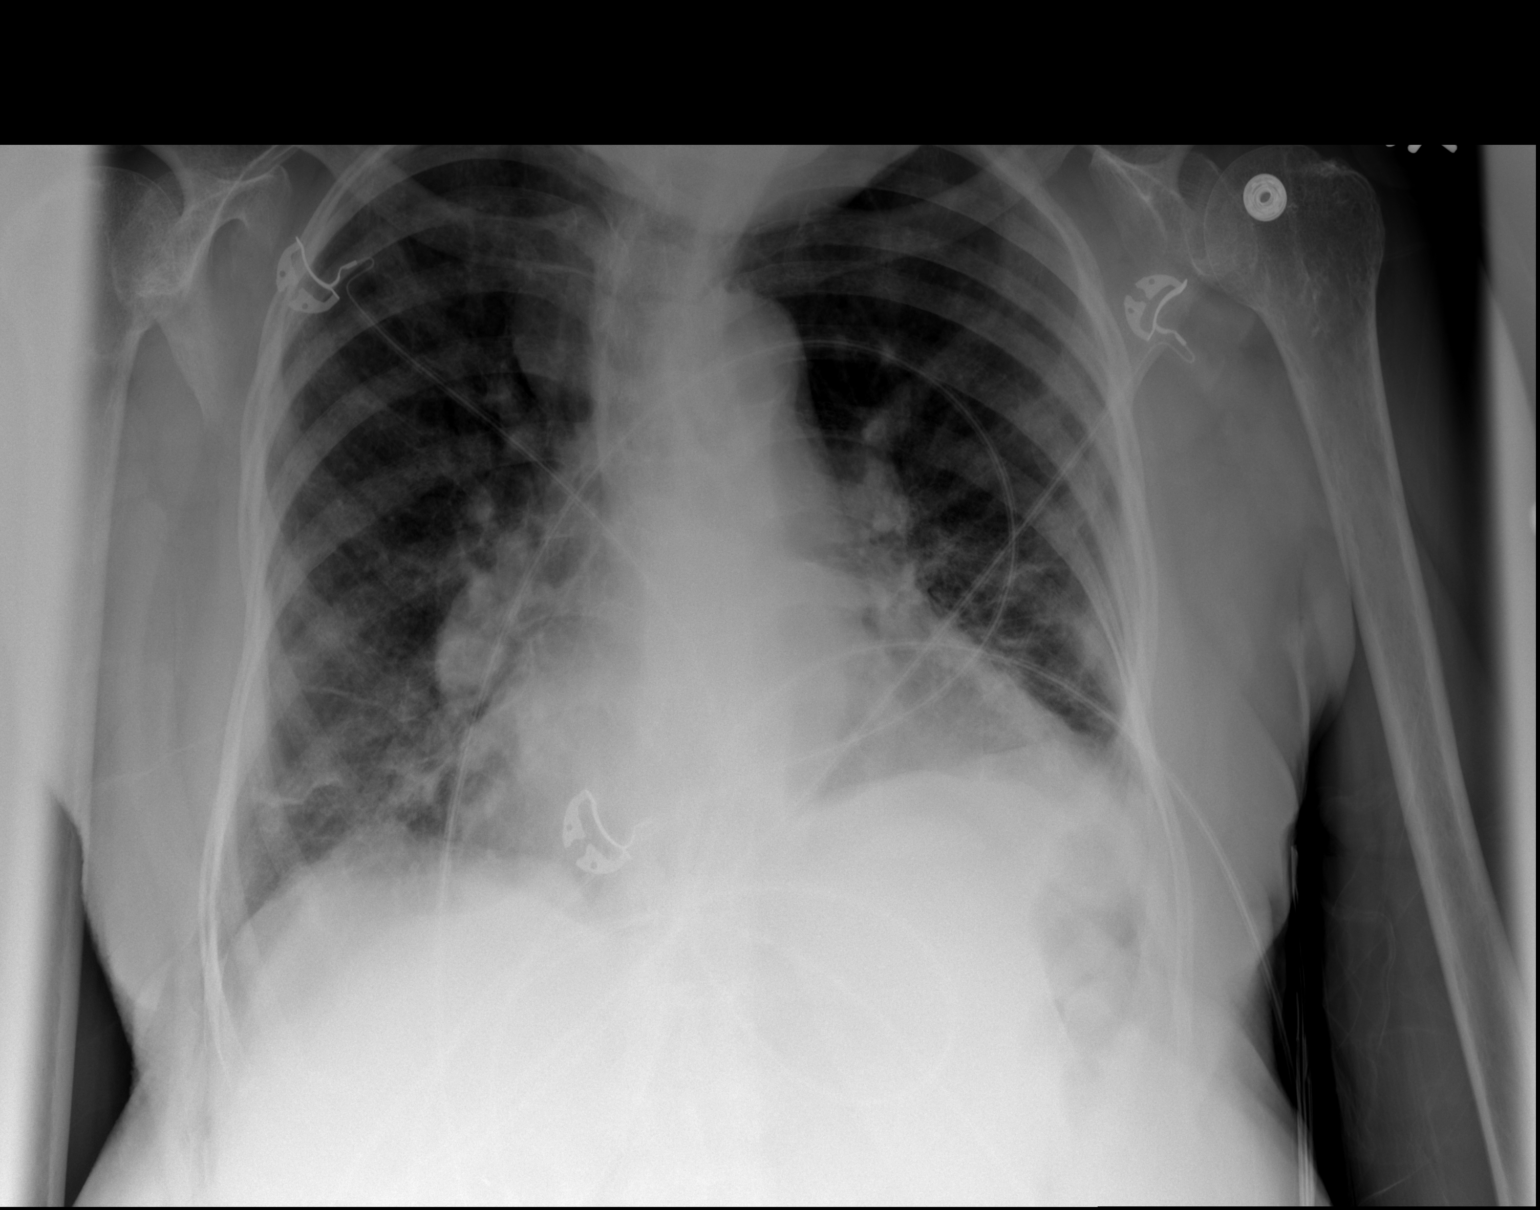

[2 of 2 positions shown; findings below may reference images not displayed]

FINDINGS: Cardiomegaly, vascular congestion. Linear atelectasis or scarring in
the lung bases no effusions. No acute bony abnormality.
IMPRESSION: Cardiomegaly with vascular congestion. Bibasilar scarring or
atelectasis, stable.

## 2019-05-17 IMAGING — CR DG WRIST COMPLETE 3+V*L*
4 series · 4 of 4 positions shown · non-contrast
Comparison: None.

CLINICAL DATA: Initial evaluation for acute swelling, no trauma.

EXAM:
LEFT WRIST - COMPLETE 3+ VIEW

[x wrist pa left]
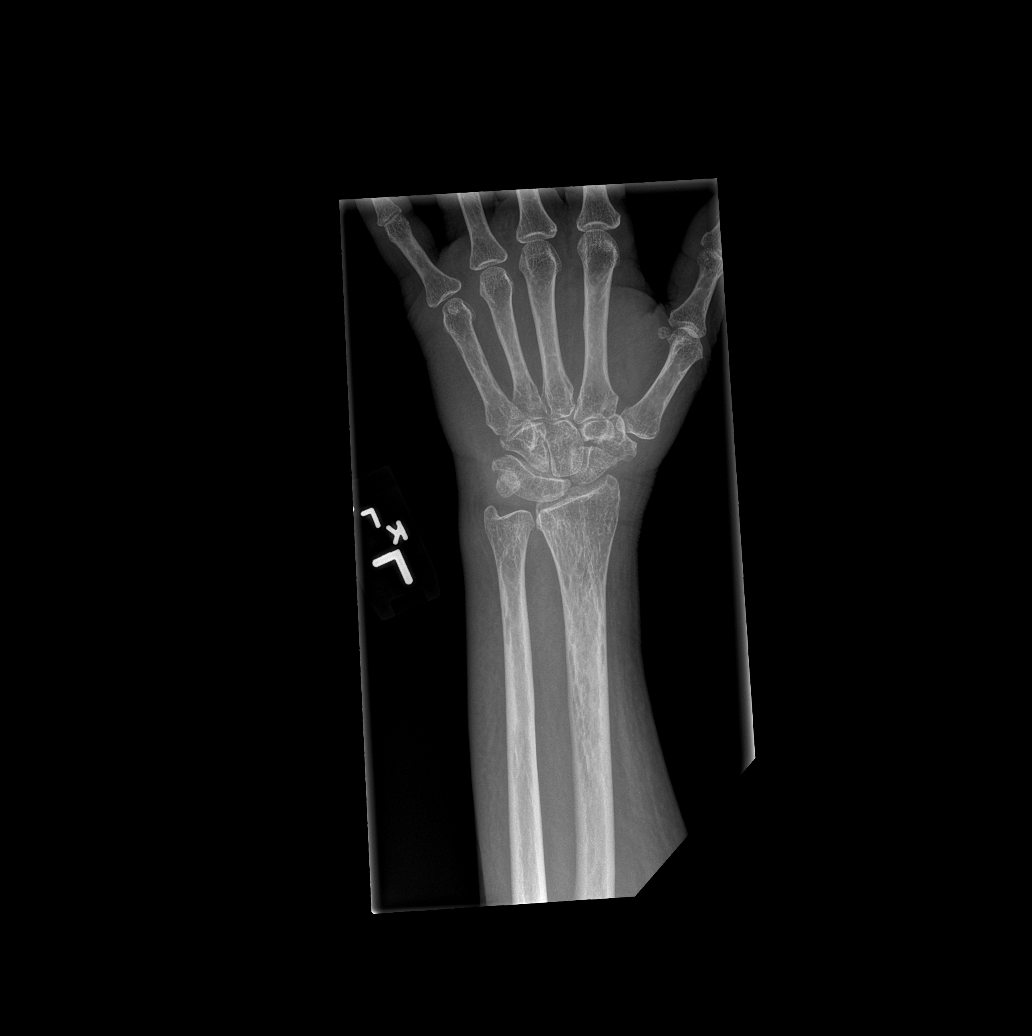

[x wrist obl left]
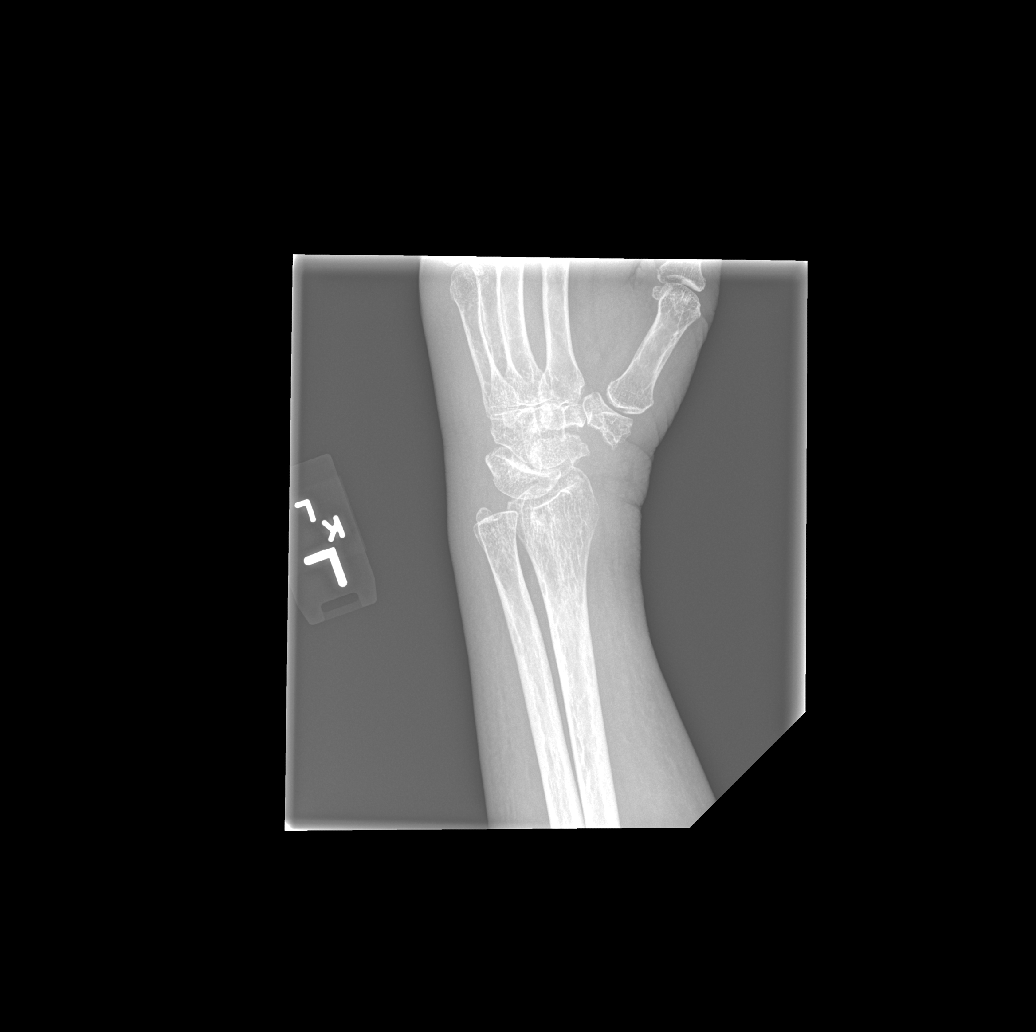

[x wrist lat left]
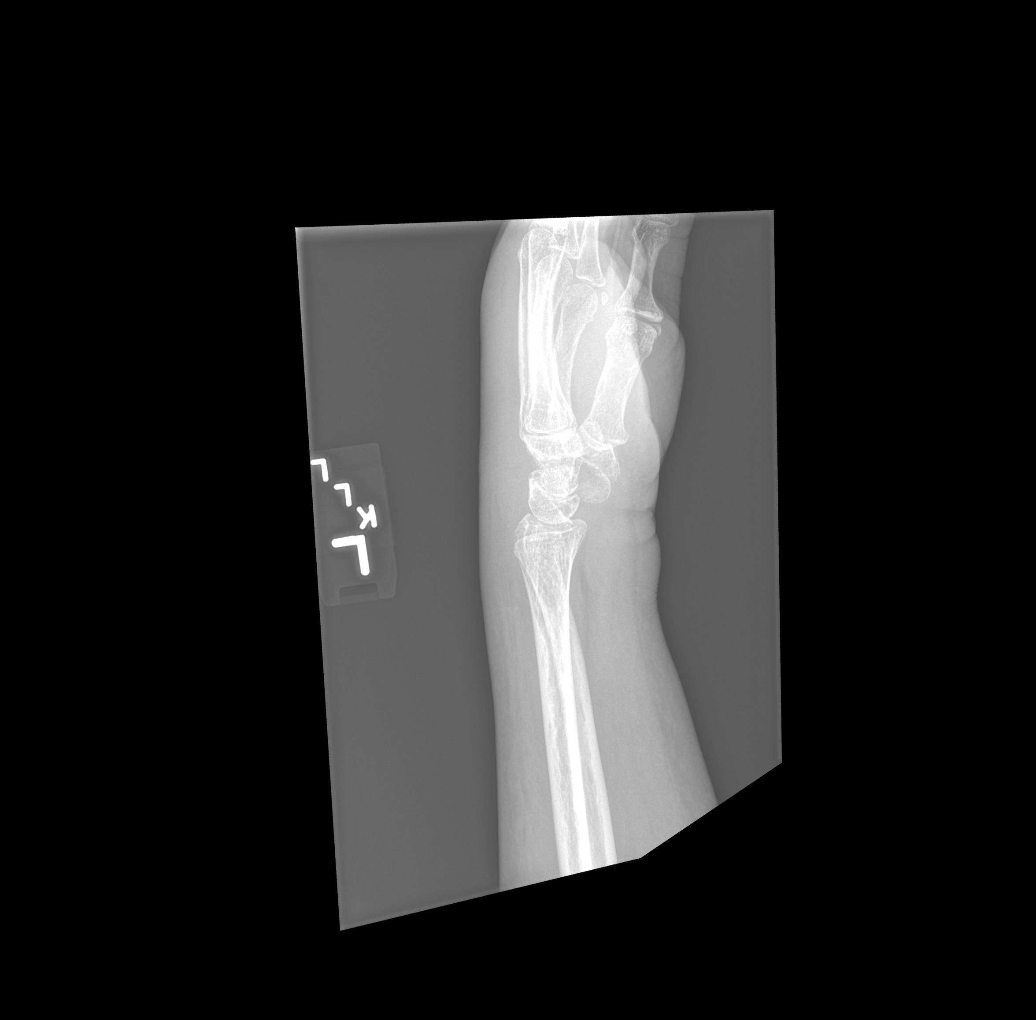

[x wrist navicular view left]
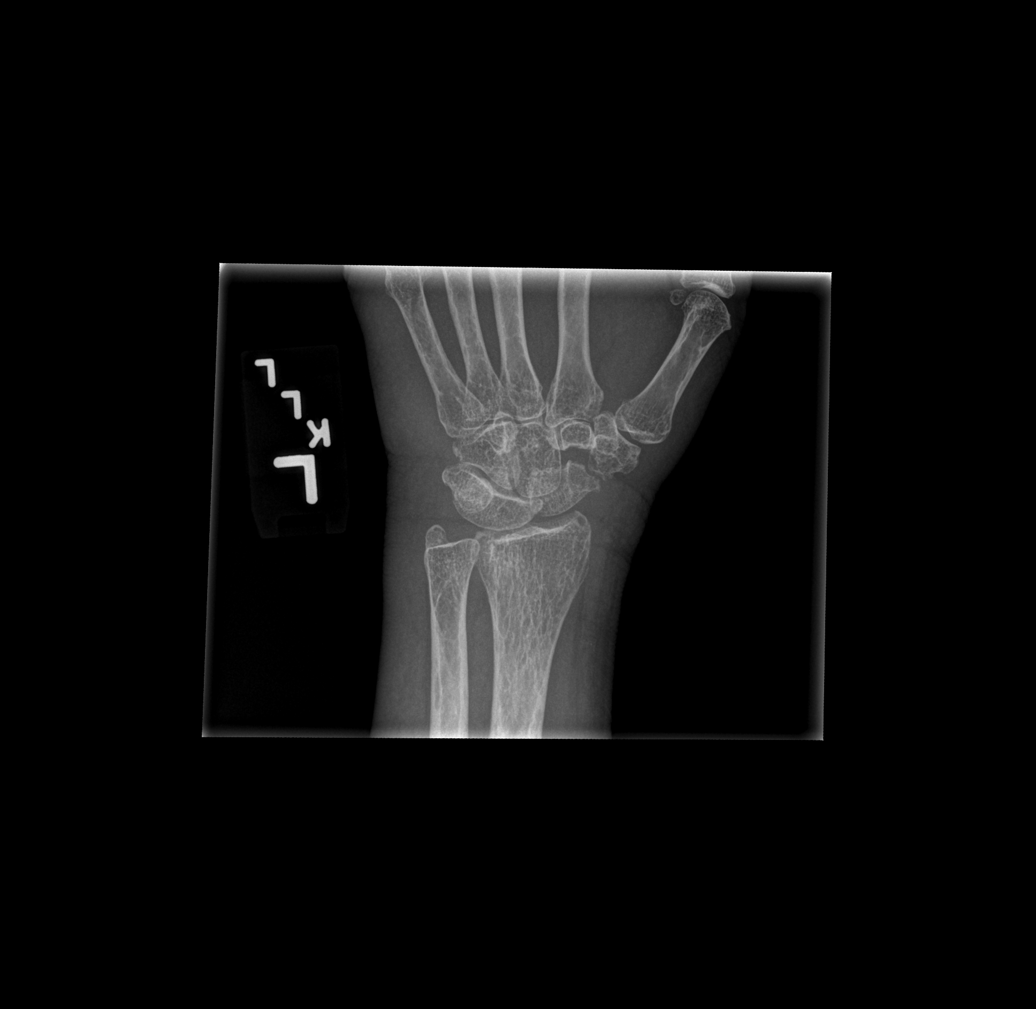

[4 of 4 positions shown; findings below may reference images not displayed]

FINDINGS: Fracture involving the waist of the scaphoid with 5 mm of
distraction, favored to be chronic. No other acute fracture
dislocation. Bones are diffusely osteopenic. Scattered
osteoarthritic changes noted about the proximal left hand. Diffuse
soft tissue swelling.
IMPRESSION: 1. Fracture involving the scaphoid waist with 5 mm of distraction,
favored to be chronic in nature. Correlation with physical exam
recommended.
2. No other acute osseous abnormality about the wrist.
3. Diffuse osteopenia.

## 2019-05-23 ENCOUNTER — Inpatient Hospital Stay (HOSPITAL_COMMUNITY): Payer: MEDICARE

## 2019-05-23 ENCOUNTER — Emergency Department (HOSPITAL_COMMUNITY): Payer: MEDICARE

## 2019-05-23 ENCOUNTER — Encounter (HOSPITAL_COMMUNITY): Payer: Self-pay | Admitting: Emergency Medicine

## 2019-05-23 ENCOUNTER — Inpatient Hospital Stay (HOSPITAL_COMMUNITY)
Admission: EM | Admit: 2019-05-23 | Discharge: 2019-06-21 | DRG: 871 | Disposition: E | Payer: MEDICARE | Attending: Pulmonary Disease | Admitting: Pulmonary Disease

## 2019-05-23 DIAGNOSIS — K7030 Alcoholic cirrhosis of liver without ascites: Secondary | ICD-10-CM | POA: Diagnosis present

## 2019-05-23 DIAGNOSIS — J189 Pneumonia, unspecified organism: Secondary | ICD-10-CM | POA: Diagnosis present

## 2019-05-23 DIAGNOSIS — J9621 Acute and chronic respiratory failure with hypoxia: Secondary | ICD-10-CM | POA: Diagnosis present

## 2019-05-23 DIAGNOSIS — K852 Alcohol induced acute pancreatitis without necrosis or infection: Secondary | ICD-10-CM | POA: Diagnosis present

## 2019-05-23 DIAGNOSIS — Z79891 Long term (current) use of opiate analgesic: Secondary | ICD-10-CM | POA: Diagnosis not present

## 2019-05-23 DIAGNOSIS — R791 Abnormal coagulation profile: Secondary | ICD-10-CM

## 2019-05-23 DIAGNOSIS — Z515 Encounter for palliative care: Secondary | ICD-10-CM | POA: Diagnosis not present

## 2019-05-23 DIAGNOSIS — E875 Hyperkalemia: Secondary | ICD-10-CM | POA: Diagnosis present

## 2019-05-23 DIAGNOSIS — N179 Acute kidney failure, unspecified: Secondary | ICD-10-CM | POA: Diagnosis present

## 2019-05-23 DIAGNOSIS — E872 Acidosis, unspecified: Secondary | ICD-10-CM

## 2019-05-23 DIAGNOSIS — D571 Sickle-cell disease without crisis: Secondary | ICD-10-CM | POA: Diagnosis present

## 2019-05-23 DIAGNOSIS — I361 Nonrheumatic tricuspid (valve) insufficiency: Secondary | ICD-10-CM

## 2019-05-23 DIAGNOSIS — I4901 Ventricular fibrillation: Secondary | ICD-10-CM

## 2019-05-23 DIAGNOSIS — I340 Nonrheumatic mitral (valve) insufficiency: Secondary | ICD-10-CM

## 2019-05-23 DIAGNOSIS — A419 Sepsis, unspecified organism: Principal | ICD-10-CM

## 2019-05-23 DIAGNOSIS — N178 Other acute kidney failure: Secondary | ICD-10-CM | POA: Diagnosis not present

## 2019-05-23 DIAGNOSIS — D564 Hereditary persistence of fetal hemoglobin [HPFH]: Secondary | ICD-10-CM | POA: Diagnosis present

## 2019-05-23 DIAGNOSIS — E874 Mixed disorder of acid-base balance: Secondary | ICD-10-CM | POA: Diagnosis not present

## 2019-05-23 DIAGNOSIS — M109 Gout, unspecified: Secondary | ICD-10-CM | POA: Diagnosis present

## 2019-05-23 DIAGNOSIS — I272 Pulmonary hypertension, unspecified: Secondary | ICD-10-CM | POA: Diagnosis present

## 2019-05-23 DIAGNOSIS — I462 Cardiac arrest due to underlying cardiac condition: Secondary | ICD-10-CM | POA: Diagnosis present

## 2019-05-23 DIAGNOSIS — Z20822 Contact with and (suspected) exposure to covid-19: Secondary | ICD-10-CM | POA: Diagnosis present

## 2019-05-23 DIAGNOSIS — Z888 Allergy status to other drugs, medicaments and biological substances status: Secondary | ICD-10-CM

## 2019-05-23 DIAGNOSIS — K859 Acute pancreatitis without necrosis or infection, unspecified: Secondary | ICD-10-CM | POA: Diagnosis not present

## 2019-05-23 DIAGNOSIS — R6521 Severe sepsis with septic shock: Secondary | ICD-10-CM | POA: Diagnosis present

## 2019-05-23 DIAGNOSIS — Z87891 Personal history of nicotine dependence: Secondary | ICD-10-CM | POA: Diagnosis not present

## 2019-05-23 DIAGNOSIS — N189 Chronic kidney disease, unspecified: Secondary | ICD-10-CM | POA: Diagnosis present

## 2019-05-23 DIAGNOSIS — Z9981 Dependence on supplemental oxygen: Secondary | ICD-10-CM | POA: Diagnosis not present

## 2019-05-23 DIAGNOSIS — I469 Cardiac arrest, cause unspecified: Secondary | ICD-10-CM | POA: Diagnosis present

## 2019-05-23 DIAGNOSIS — Z66 Do not resuscitate: Secondary | ICD-10-CM | POA: Diagnosis not present

## 2019-05-23 DIAGNOSIS — E8729 Other acidosis: Secondary | ICD-10-CM

## 2019-05-23 DIAGNOSIS — K7290 Hepatic failure, unspecified without coma: Secondary | ICD-10-CM | POA: Diagnosis present

## 2019-05-23 DIAGNOSIS — R7401 Elevation of levels of liver transaminase levels: Secondary | ICD-10-CM

## 2019-05-23 LAB — I-STAT CHEM 8, ED
BUN: 98 mg/dL — ABNORMAL HIGH (ref 8–23)
Calcium, Ion: 1.05 mmol/L — ABNORMAL LOW (ref 1.15–1.40)
Chloride: 109 mmol/L (ref 98–111)
Creatinine, Ser: 4.3 mg/dL — ABNORMAL HIGH (ref 0.44–1.00)
Glucose, Bld: 120 mg/dL — ABNORMAL HIGH (ref 70–99)
HCT: 15 % — ABNORMAL LOW (ref 36.0–46.0)
Hemoglobin: 5.1 g/dL — CL (ref 12.0–15.0)
Potassium: 7.5 mmol/L (ref 3.5–5.1)
Sodium: 134 mmol/L — ABNORMAL LOW (ref 135–145)
TCO2: 9 mmol/L — ABNORMAL LOW (ref 22–32)

## 2019-05-23 LAB — CBC WITH DIFFERENTIAL/PLATELET
Abs Immature Granulocytes: 0.67 10*3/uL — ABNORMAL HIGH (ref 0.00–0.07)
Abs Immature Granulocytes: 0 10*3/uL (ref 0.00–0.07)
Basophils Absolute: 0 10*3/uL (ref 0.0–0.1)
Basophils Absolute: 0 10*3/uL (ref 0.0–0.1)
Basophils Relative: 0 %
Basophils Relative: 0 %
Eosinophils Absolute: 0 10*3/uL (ref 0.0–0.5)
Eosinophils Absolute: 0 10*3/uL (ref 0.0–0.5)
Eosinophils Relative: 0 %
Eosinophils Relative: 0 %
HCT: 17.1 % — ABNORMAL LOW (ref 36.0–46.0)
HCT: 23.3 % — ABNORMAL LOW (ref 36.0–46.0)
Hemoglobin: 5.4 g/dL — CL (ref 12.0–15.0)
Hemoglobin: 7.6 g/dL — ABNORMAL LOW (ref 12.0–15.0)
Immature Granulocytes: 3 %
Lymphocytes Relative: 8 %
Lymphocytes Relative: 8 %
Lymphs Abs: 1.8 10*3/uL (ref 0.7–4.0)
Lymphs Abs: 1.9 10*3/uL (ref 0.7–4.0)
MCH: 36.2 pg — ABNORMAL HIGH (ref 26.0–34.0)
MCH: 33.3 pg (ref 26.0–34.0)
MCHC: 31.6 g/dL (ref 30.0–36.0)
MCHC: 32.6 g/dL (ref 30.0–36.0)
MCV: 114.8 fL — ABNORMAL HIGH (ref 80.0–100.0)
MCV: 102.2 fL — ABNORMAL HIGH (ref 80.0–100.0)
Monocytes Absolute: 1.3 10*3/uL — ABNORMAL HIGH (ref 0.1–1.0)
Monocytes Absolute: 0.7 10*3/uL (ref 0.1–1.0)
Monocytes Relative: 6 %
Monocytes Relative: 3 %
Neutro Abs: 18.1 10*3/uL — ABNORMAL HIGH (ref 1.7–7.7)
Neutro Abs: 21.3 10*3/uL — ABNORMAL HIGH (ref 1.7–7.7)
Neutrophils Relative %: 83 %
Neutrophils Relative %: 89 %
Platelets: 178 10*3/uL (ref 150–400)
Platelets: 115 10*3/uL — ABNORMAL LOW (ref 150–400)
RBC: 1.49 MIL/uL — ABNORMAL LOW (ref 3.87–5.11)
RBC: 2.28 MIL/uL — ABNORMAL LOW (ref 3.87–5.11)
RDW: 27.5 % — ABNORMAL HIGH (ref 11.5–15.5)
RDW: 21.9 % — ABNORMAL HIGH (ref 11.5–15.5)
WBC: 22 10*3/uL — ABNORMAL HIGH (ref 4.0–10.5)
WBC: 23.9 10*3/uL — ABNORMAL HIGH (ref 4.0–10.5)
nRBC: 8.5 % — ABNORMAL HIGH (ref 0.0–0.2)
nRBC: 10 /100 WBC — ABNORMAL HIGH
nRBC: 8.8 % — ABNORMAL HIGH (ref 0.0–0.2)

## 2019-05-23 LAB — PROTIME-INR
INR: 6.7 (ref 0.8–1.2)
INR: 5.7 (ref 0.8–1.2)
Prothrombin Time: 58.8 seconds — ABNORMAL HIGH (ref 11.4–15.2)
Prothrombin Time: 51.3 seconds — ABNORMAL HIGH (ref 11.4–15.2)

## 2019-05-23 LAB — BASIC METABOLIC PANEL
Anion gap: 24 — ABNORMAL HIGH (ref 5–15)
Anion gap: 26 — ABNORMAL HIGH (ref 5–15)
BUN: 80 mg/dL — ABNORMAL HIGH (ref 8–23)
BUN: 83 mg/dL — ABNORMAL HIGH (ref 8–23)
CO2: 12 mmol/L — ABNORMAL LOW (ref 22–32)
CO2: 17 mmol/L — ABNORMAL LOW (ref 22–32)
Calcium: 7.6 mg/dL — ABNORMAL LOW (ref 8.9–10.3)
Calcium: 7.6 mg/dL — ABNORMAL LOW (ref 8.9–10.3)
Chloride: 108 mmol/L (ref 98–111)
Chloride: 105 mmol/L (ref 98–111)
Creatinine, Ser: 3.42 mg/dL — ABNORMAL HIGH (ref 0.44–1.00)
Creatinine, Ser: 3.68 mg/dL — ABNORMAL HIGH (ref 0.44–1.00)
GFR calc Af Amer: 15 mL/min — ABNORMAL LOW (ref >60)
GFR calc Af Amer: 13 mL/min — ABNORMAL LOW (ref >60)
GFR calc non Af Amer: 13 mL/min — ABNORMAL LOW (ref >60)
GFR calc non Af Amer: 12 mL/min — ABNORMAL LOW (ref >60)
Glucose, Bld: 95 mg/dL (ref 70–99)
Glucose, Bld: 64 mg/dL — ABNORMAL LOW (ref 70–99)
Potassium: 6.7 mmol/L (ref 3.5–5.1)
Potassium: 6.1 mmol/L — ABNORMAL HIGH (ref 3.5–5.1)
Sodium: 144 mmol/L (ref 135–145)
Sodium: 148 mmol/L — ABNORMAL HIGH (ref 135–145)

## 2019-05-23 LAB — POC SARS CORONAVIRUS 2 AG -  ED: SARS Coronavirus 2 Ag: NEGATIVE

## 2019-05-23 LAB — COMPREHENSIVE METABOLIC PANEL
ALT: 145 U/L — ABNORMAL HIGH (ref 0–44)
ALT: 276 U/L — ABNORMAL HIGH (ref 0–44)
AST: 341 U/L — ABNORMAL HIGH (ref 15–41)
AST: 794 U/L — ABNORMAL HIGH (ref 15–41)
Albumin: 2.8 g/dL — ABNORMAL LOW (ref 3.5–5.0)
Albumin: 2.9 g/dL — ABNORMAL LOW (ref 3.5–5.0)
Alkaline Phosphatase: 101 U/L (ref 38–126)
Alkaline Phosphatase: 92 U/L (ref 38–126)
Anion gap: 25 — ABNORMAL HIGH (ref 5–15)
Anion gap: 25 — ABNORMAL HIGH (ref 5–15)
BUN: 87 mg/dL — ABNORMAL HIGH (ref 8–23)
BUN: 81 mg/dL — ABNORMAL HIGH (ref 8–23)
CO2: 7 mmol/L — ABNORMAL LOW (ref 22–32)
CO2: 19 mmol/L — ABNORMAL LOW (ref 22–32)
Calcium: 8 mg/dL — ABNORMAL LOW (ref 8.9–10.3)
Calcium: 7.8 mg/dL — ABNORMAL LOW (ref 8.9–10.3)
Chloride: 104 mmol/L (ref 98–111)
Chloride: 104 mmol/L (ref 98–111)
Creatinine, Ser: 3.99 mg/dL — ABNORMAL HIGH (ref 0.44–1.00)
Creatinine, Ser: 3.68 mg/dL — ABNORMAL HIGH (ref 0.44–1.00)
GFR calc Af Amer: 12 mL/min — ABNORMAL LOW (ref >60)
GFR calc Af Amer: 13 mL/min — ABNORMAL LOW (ref >60)
GFR calc non Af Amer: 10 mL/min — ABNORMAL LOW (ref >60)
GFR calc non Af Amer: 12 mL/min — ABNORMAL LOW (ref >60)
Glucose, Bld: 136 mg/dL — ABNORMAL HIGH (ref 70–99)
Glucose, Bld: 92 mg/dL (ref 70–99)
Potassium: >7.5 mmol/L (ref 3.5–5.1)
Potassium: 5.4 mmol/L — ABNORMAL HIGH (ref 3.5–5.1)
Sodium: 136 mmol/L (ref 135–145)
Sodium: 148 mmol/L — ABNORMAL HIGH (ref 135–145)
Total Bilirubin: 17.6 mg/dL — ABNORMAL HIGH (ref 0.3–1.2)
Total Bilirubin: 20.8 mg/dL (ref 0.3–1.2)
Total Protein: 4.3 g/dL — ABNORMAL LOW (ref 6.5–8.1)
Total Protein: 4.4 g/dL — ABNORMAL LOW (ref 6.5–8.1)

## 2019-05-23 LAB — URINALYSIS, ROUTINE W REFLEX MICROSCOPIC
Glucose, UA: 50 mg/dL — AB
Ketones, ur: 5 mg/dL — AB
Leukocytes,Ua: NEGATIVE
Nitrite: NEGATIVE
Protein, ur: >=300 mg/dL — AB
Specific Gravity, Urine: 1.017 (ref 1.005–1.030)
pH: 5 (ref 5.0–8.0)

## 2019-05-23 LAB — ECHOCARDIOGRAM COMPLETE
Height: 62 in
Weight: 2564.39 oz

## 2019-05-23 LAB — RAPID URINE DRUG SCREEN, HOSP PERFORMED
Amphetamines: NOT DETECTED
Barbiturates: NOT DETECTED
Benzodiazepines: NOT DETECTED
Cocaine: NOT DETECTED
Opiates: POSITIVE — AB
Tetrahydrocannabinol: NOT DETECTED

## 2019-05-23 LAB — BRAIN NATRIURETIC PEPTIDE: B Natriuretic Peptide: 2197.6 pg/mL — ABNORMAL HIGH (ref 0.0–100.0)

## 2019-05-23 LAB — SALICYLATE LEVEL: Salicylate Lvl: 14.3 mg/dL (ref 7.0–30.0)

## 2019-05-23 LAB — RETICULOCYTES
Immature Retic Fract: 30.8 % — ABNORMAL HIGH (ref 2.3–15.9)
RBC.: 2.28 MIL/uL — ABNORMAL LOW (ref 3.87–5.11)
Retic Count, Absolute: 485 10*3/uL — ABNORMAL HIGH (ref 19.0–186.0)
Retic Ct Pct: 21.2 % — ABNORMAL HIGH (ref 0.4–3.1)

## 2019-05-23 LAB — TROPONIN I (HIGH SENSITIVITY)
Troponin I (High Sensitivity): 29 ng/L — ABNORMAL HIGH (ref <18)
Troponin I (High Sensitivity): 21 ng/L — ABNORMAL HIGH (ref <18)

## 2019-05-23 LAB — POCT I-STAT 7, (LYTES, BLD GAS, ICA,H+H)
Acid-base deficit: 13 mmol/L — ABNORMAL HIGH (ref 0.0–2.0)
Acid-base deficit: 3 mmol/L — ABNORMAL HIGH (ref 0.0–2.0)
Bicarbonate: 17.3 mmol/L — ABNORMAL LOW (ref 20.0–28.0)
Bicarbonate: 25.2 mmol/L (ref 20.0–28.0)
Calcium, Ion: 1.09 mmol/L — ABNORMAL LOW (ref 1.15–1.40)
Calcium, Ion: 1.04 mmol/L — ABNORMAL LOW (ref 1.15–1.40)
HCT: 25 % — ABNORMAL LOW (ref 36.0–46.0)
HCT: 21 % — ABNORMAL LOW (ref 36.0–46.0)
Hemoglobin: 8.5 g/dL — ABNORMAL LOW (ref 12.0–15.0)
Hemoglobin: 7.1 g/dL — ABNORMAL LOW (ref 12.0–15.0)
O2 Saturation: 100 %
O2 Saturation: 90 %
Patient temperature: 32.8
Patient temperature: 33.6
Potassium: 6.2 mmol/L — ABNORMAL HIGH (ref 3.5–5.1)
Potassium: 5.5 mmol/L — ABNORMAL HIGH (ref 3.5–5.1)
Sodium: 138 mmol/L (ref 135–145)
Sodium: 145 mmol/L (ref 135–145)
TCO2: 19 mmol/L — ABNORMAL LOW (ref 22–32)
TCO2: 27 mmol/L (ref 22–32)
pCO2 arterial: 55.2 mmHg — ABNORMAL HIGH (ref 32.0–48.0)
pCO2 arterial: 57.9 mmHg — ABNORMAL HIGH (ref 32.0–48.0)
pH, Arterial: 7.076 — CL (ref 7.350–7.450)
pH, Arterial: 7.228 — ABNORMAL LOW (ref 7.350–7.450)
pO2, Arterial: 331 mmHg — ABNORMAL HIGH (ref 83.0–108.0)
pO2, Arterial: 59 mmHg — ABNORMAL LOW (ref 83.0–108.0)

## 2019-05-23 LAB — RESPIRATORY PANEL BY RT PCR (FLU A&B, COVID)
Influenza A by PCR: NEGATIVE
Influenza B by PCR: NEGATIVE
SARS Coronavirus 2 by RT PCR: NEGATIVE

## 2019-05-23 LAB — LACTIC ACID, PLASMA
Lactic Acid, Venous: >11 mmol/L (ref 0.5–1.9)
Lactic Acid, Venous: 10.5 mmol/L (ref 0.5–1.9)
Lactic Acid, Venous: 10.9 mmol/L (ref 0.5–1.9)

## 2019-05-23 LAB — MRSA PCR SCREENING: MRSA by PCR: NEGATIVE

## 2019-05-23 LAB — ETHANOL: Alcohol, Ethyl (B): <10 mg/dL (ref <10)

## 2019-05-23 LAB — ACETAMINOPHEN LEVEL: Acetaminophen (Tylenol), Serum: <10 ug/mL — ABNORMAL LOW (ref 10–30)

## 2019-05-23 LAB — CBG MONITORING, ED: Glucose-Capillary: 116 mg/dL — ABNORMAL HIGH (ref 70–99)

## 2019-05-23 LAB — GLUCOSE, CAPILLARY
Glucose-Capillary: 164 mg/dL — ABNORMAL HIGH (ref 70–99)
Glucose-Capillary: 97 mg/dL (ref 70–99)
Glucose-Capillary: 46 mg/dL — ABNORMAL LOW (ref 70–99)

## 2019-05-23 LAB — MAGNESIUM: Magnesium: 3.2 mg/dL — ABNORMAL HIGH (ref 1.7–2.4)

## 2019-05-23 LAB — AMMONIA: Ammonia: 270 umol/L — ABNORMAL HIGH (ref 9–35)

## 2019-05-23 LAB — APTT: aPTT: 81 seconds — ABNORMAL HIGH (ref 24–36)

## 2019-05-23 LAB — LIPASE, BLOOD: Lipase: 360 U/L — ABNORMAL HIGH (ref 11–51)

## 2019-05-23 LAB — TRIGLYCERIDES: Triglycerides: 111 mg/dL (ref <150)

## 2019-05-23 LAB — PREPARE RBC (CROSSMATCH)

## 2019-05-23 MED ORDER — SODIUM BICARBONATE 8.4 % IV SOLN
150.0000 meq | Freq: Once | INTRAVENOUS | Status: AC
  Administered 2019-05-23: 03:00:00 150 meq via INTRAVENOUS
  Filled 2019-05-23: qty 50

## 2019-05-23 MED ORDER — SODIUM POLYSTYRENE SULFONATE 15 GM/60ML PO SUSP
30.0000 g | Freq: Once | ORAL | Status: AC
  Administered 2019-05-23: 30 g
  Filled 2019-05-23: qty 120

## 2019-05-23 MED ORDER — VASOPRESSIN 20 UNIT/ML IV SOLN
0.0400 [IU]/min | INTRAVENOUS | Status: DC
  Administered 2019-05-23: 0.04 [IU]/min via INTRAVENOUS
  Filled 2019-05-23: qty 2

## 2019-05-23 MED ORDER — SODIUM BICARBONATE 8.4 % IV SOLN: 100.0000 meq | Freq: Once | INTRAVENOUS | Status: DC

## 2019-05-23 MED ORDER — HALOPERIDOL LACTATE 5 MG/ML IJ SOLN: 0.5000 mg | INTRAMUSCULAR | Status: DC | PRN

## 2019-05-23 MED ORDER — VANCOMYCIN VARIABLE DOSE PER UNSTABLE RENAL FUNCTION (PHARMACIST DOSING): Status: DC

## 2019-05-23 MED ORDER — ALBUTEROL SULFATE (2.5 MG/3ML) 0.083% IN NEBU
5.0000 mg | INHALATION_SOLUTION | RESPIRATORY_TRACT | Status: AC
  Administered 2019-05-23 (×3): 5 mg via RESPIRATORY_TRACT
  Filled 2019-05-23 (×3): qty 6

## 2019-05-23 MED ORDER — DEXTROSE 50 % IV SOLN
1.0000 | Freq: Once | INTRAVENOUS | Status: AC
  Administered 2019-05-23: 07:00:00 50 mL via INTRAVENOUS
  Filled 2019-05-23: qty 50

## 2019-05-23 MED ORDER — SODIUM BICARBONATE 4 % IV SOLN: 5.0000 mL | Freq: Once | INTRAVENOUS | Status: DC

## 2019-05-23 MED ORDER — IPRATROPIUM-ALBUTEROL 0.5-2.5 (3) MG/3ML IN SOLN
3.0000 mL | Freq: Four times a day (QID) | RESPIRATORY_TRACT | Status: DC
  Filled 2019-05-23: qty 3

## 2019-05-23 MED ORDER — ASPIRIN 300 MG RE SUPP
300.0000 mg | RECTAL | Status: AC
  Administered 2019-05-23: 300 mg via RECTAL
  Filled 2019-05-23: qty 1

## 2019-05-23 MED ORDER — STERILE WATER FOR INJECTION IV SOLN
INTRAVENOUS | Status: DC
  Filled 2019-05-23: qty 850

## 2019-05-23 MED ORDER — SODIUM BICARBONATE 8.4 % IV SOLN
100.0000 meq | Freq: Once | INTRAVENOUS | Status: AC
  Administered 2019-05-23: 07:00:00 100 meq via INTRAVENOUS
  Filled 2019-05-23: qty 100

## 2019-05-23 MED ORDER — LACTATED RINGERS IV BOLUS
500.0000 mL | Freq: Once | INTRAVENOUS | Status: AC
  Administered 2019-05-23: 500 mL via INTRAVENOUS

## 2019-05-23 MED ORDER — SODIUM CHLORIDE 0.9 % IV SOLN: INTRAVENOUS | Status: DC

## 2019-05-23 MED ORDER — SODIUM CHLORIDE 0.9% IV SOLUTION: Freq: Once | INTRAVENOUS | Status: DC

## 2019-05-23 MED ORDER — MORPHINE 100MG IN NS 100ML (1MG/ML) PREMIX INFUSION: 5.0000 mg/h | INTRAVENOUS | Status: DC

## 2019-05-23 MED ORDER — HALOPERIDOL LACTATE 2 MG/ML PO CONC
0.5000 mg | ORAL | Status: DC | PRN
  Filled 2019-05-23: qty 0.3

## 2019-05-23 MED ORDER — ORAL CARE MOUTH RINSE
15.0000 mL | OROMUCOSAL | Status: DC
  Administered 2019-05-23 (×3): 15 mL via OROMUCOSAL

## 2019-05-23 MED ORDER — MORPHINE BOLUS VIA INFUSION
2.0000 mg | INTRAVENOUS | Status: DC | PRN
  Filled 2019-05-23 (×2): qty 2

## 2019-05-23 MED ORDER — NOREPINEPHRINE 4 MG/250ML-% IV SOLN
0.0000 ug/min | INTRAVENOUS | Status: DC
  Administered 2019-05-23: 12:00:00 16 ug/min via INTRAVENOUS
  Administered 2019-05-23: 06:00:00 20 ug/min via INTRAVENOUS
  Administered 2019-05-23: 15:00:00 6 ug/min via INTRAVENOUS
  Filled 2019-05-23 (×3): qty 250

## 2019-05-23 MED ORDER — CALCIUM GLUCONATE-NACL 1-0.675 GM/50ML-% IV SOLN
1.0000 g | Freq: Once | INTRAVENOUS | Status: AC
  Administered 2019-05-23: 07:00:00 1000 mg via INTRAVENOUS
  Filled 2019-05-23: qty 50

## 2019-05-23 MED ORDER — DEXTROSE 50 % IV SOLN
1.0000 | Freq: Once | INTRAVENOUS | Status: AC
  Administered 2019-05-23: 04:00:00 50 mL via INTRAVENOUS

## 2019-05-23 MED ORDER — STERILE WATER FOR INJECTION IV SOLN
Freq: Once | INTRAVENOUS | Status: AC
  Filled 2019-05-23: qty 850

## 2019-05-23 MED ORDER — POLYVINYL ALCOHOL 1.4 % OP SOLN
1.0000 [drp] | Freq: Four times a day (QID) | OPHTHALMIC | Status: DC | PRN
  Filled 2019-05-23: qty 15

## 2019-05-23 MED ORDER — ONDANSETRON 4 MG PO TBDP
4.0000 mg | ORAL_TABLET | Freq: Four times a day (QID) | ORAL | Status: DC | PRN
  Filled 2019-05-23: qty 1

## 2019-05-23 MED ORDER — LACTULOSE 10 GM/15ML PO SOLN
30.0000 g | Freq: Three times a day (TID) | ORAL | Status: DC
  Administered 2019-05-23: 11:00:00 30 g
  Filled 2019-05-23: qty 45

## 2019-05-23 MED ORDER — CALCIUM CHLORIDE 10 % IV SOLN
1.0000 g | Freq: Once | INTRAVENOUS | Status: AC
  Administered 2019-05-23: 01:00:00 1 g via INTRAVENOUS

## 2019-05-23 MED ORDER — CHLORHEXIDINE GLUCONATE CLOTH 2 % EX PADS
6.0000 | MEDICATED_PAD | Freq: Every day | CUTANEOUS | Status: DC
  Administered 2019-05-23: 10:00:00 6 via TOPICAL

## 2019-05-23 MED ORDER — FENTANYL 2500MCG IN NS 250ML (10MCG/ML) PREMIX INFUSION
100.0000 ug/h | INTRAVENOUS | Status: DC
  Administered 2019-05-23: 06:00:00 100 ug/h via INTRAVENOUS
  Filled 2019-05-23: qty 250

## 2019-05-23 MED ORDER — HALOPERIDOL 0.5 MG PO TABS
0.5000 mg | ORAL_TABLET | ORAL | Status: DC | PRN
  Filled 2019-05-23: qty 1

## 2019-05-23 MED ORDER — ACETAMINOPHEN 650 MG RE SUPP: 650.0000 mg | Freq: Four times a day (QID) | RECTAL | Status: DC | PRN

## 2019-05-23 MED ORDER — GLYCOPYRROLATE 0.2 MG/ML IJ SOLN: 0.2000 mg | INTRAMUSCULAR | Status: DC | PRN

## 2019-05-23 MED ORDER — VITAMIN K1 10 MG/ML IJ SOLN
1.0000 mg | Freq: Once | INTRAVENOUS | Status: AC
  Administered 2019-05-23: 04:00:00 1 mg via INTRAVENOUS
  Filled 2019-05-23: qty 0.1

## 2019-05-23 MED ORDER — VANCOMYCIN HCL 1500 MG/300ML IV SOLN
1500.0000 mg | Freq: Once | INTRAVENOUS | Status: AC
  Administered 2019-05-23: 03:00:00 1500 mg via INTRAVENOUS
  Filled 2019-05-23: qty 300

## 2019-05-23 MED ORDER — PANTOPRAZOLE SODIUM 40 MG IV SOLR
40.0000 mg | Freq: Every day | INTRAVENOUS | Status: DC
  Administered 2019-05-23: 11:00:00 40 mg via INTRAVENOUS
  Filled 2019-05-23: qty 40

## 2019-05-23 MED ORDER — INSULIN ASPART 100 UNIT/ML IV SOLN
10.0000 [IU] | Freq: Once | INTRAVENOUS | Status: AC
  Administered 2019-05-23: 07:00:00 10 [IU] via INTRAVENOUS

## 2019-05-23 MED ORDER — SODIUM BICARBONATE 8.4 % IV SOLN
200.0000 meq | Freq: Once | INTRAVENOUS | Status: AC
  Administered 2019-05-23: 08:00:00 200 meq via INTRAVENOUS
  Filled 2019-05-23: qty 50

## 2019-05-23 MED ORDER — SODIUM CHLORIDE 0.9% IV SOLUTION: Freq: Once | INTRAVENOUS | Status: AC

## 2019-05-23 MED ORDER — FENTANYL CITRATE (PF) 100 MCG/2ML IJ SOLN: 50.0000 ug | Freq: Once | INTRAMUSCULAR | Status: DC

## 2019-05-23 MED ORDER — GLYCOPYRROLATE 1 MG PO TABS
1.0000 mg | ORAL_TABLET | ORAL | Status: DC | PRN
  Filled 2019-05-23: qty 1

## 2019-05-23 MED ORDER — GLYCOPYRROLATE 0.2 MG/ML IJ SOLN
0.2000 mg | INTRAMUSCULAR | Status: DC | PRN
  Filled 2019-05-23: qty 1

## 2019-05-23 MED ORDER — ONDANSETRON HCL 4 MG/2ML IJ SOLN: 4.0000 mg | Freq: Four times a day (QID) | INTRAMUSCULAR | Status: DC | PRN

## 2019-05-23 MED ORDER — ACETAMINOPHEN 325 MG PO TABS: 650.0000 mg | ORAL_TABLET | Freq: Four times a day (QID) | ORAL | Status: DC | PRN

## 2019-05-23 MED ORDER — NOREPINEPHRINE 4 MG/250ML-% IV SOLN
INTRAVENOUS | Status: AC
  Administered 2019-05-23: 01:00:00 10 mg via INTRAVENOUS
  Filled 2019-05-23: qty 250

## 2019-05-23 MED ORDER — PROPOFOL 1000 MG/100ML IV EMUL
25.0000 ug/kg/min | INTRAVENOUS | Status: DC
  Filled 2019-05-23: qty 100

## 2019-05-23 MED ORDER — PROPOFOL 1000 MG/100ML IV EMUL
INTRAVENOUS | Status: AC
  Filled 2019-05-23: qty 100

## 2019-05-23 MED ORDER — ALBUMIN HUMAN 25 % IV SOLN
12.5000 g | Freq: Once | INTRAVENOUS | Status: AC
  Administered 2019-05-23: 07:00:00 12.5 g via INTRAVENOUS
  Filled 2019-05-23: qty 100

## 2019-05-23 MED ORDER — SODIUM BICARBONATE 8.4 % IV SOLN
50.0000 meq | Freq: Once | INTRAVENOUS | Status: AC
  Administered 2019-05-23: 01:00:00 50 meq via INTRAVENOUS

## 2019-05-23 MED ORDER — INSULIN ASPART 100 UNIT/ML IV SOLN
10.0000 [IU] | Freq: Once | INTRAVENOUS | Status: AC
  Administered 2019-05-23: 04:00:00 10 [IU] via INTRAVENOUS

## 2019-05-23 MED ORDER — PIPERACILLIN-TAZOBACTAM IN DEX 2-0.25 GM/50ML IV SOLN
2.2500 g | Freq: Three times a day (TID) | INTRAVENOUS | Status: DC
  Administered 2019-05-23 (×2): 2.25 g via INTRAVENOUS
  Filled 2019-05-23 (×4): qty 50

## 2019-05-23 MED ORDER — SODIUM CHLORIDE 0.9 % IV BOLUS
1000.0000 mL | Freq: Once | INTRAVENOUS | Status: AC
  Administered 2019-05-23: 02:00:00 1000 mL via INTRAVENOUS

## 2019-05-23 MED ORDER — FENTANYL BOLUS VIA INFUSION
25.0000 ug | INTRAVENOUS | Status: DC | PRN
  Filled 2019-05-23: qty 25

## 2019-05-23 MED ORDER — CHLORHEXIDINE GLUCONATE 0.12% ORAL RINSE (MEDLINE KIT)
15.0000 mL | Freq: Two times a day (BID) | OROMUCOSAL | Status: DC
  Administered 2019-05-23: 09:00:00 15 mL via OROMUCOSAL

## 2019-05-23 MED ORDER — BIOTENE DRY MOUTH MT LIQD: 15.0000 mL | OROMUCOSAL | Status: DC | PRN

## 2019-05-23 MED ORDER — SODIUM BICARBONATE 8.4 % IV SOLN
INTRAVENOUS | Status: AC
  Filled 2019-05-23: qty 150

## 2019-05-23 MED FILL — Medication: Qty: 1 | Status: AC

## 2019-05-24 ENCOUNTER — Ambulatory Visit: Payer: BLUE CROSS/BLUE SHIELD

## 2019-05-24 ENCOUNTER — Other Ambulatory Visit: Payer: BLUE CROSS/BLUE SHIELD

## 2019-05-24 LAB — TYPE AND SCREEN
ABO/RH(D): B POS
Antibody Screen: NEGATIVE
Donor AG Type: NEGATIVE
Unit division: 0
Unit division: 0

## 2019-05-24 LAB — PREPARE FRESH FROZEN PLASMA
Unit division: 0
Unit division: 0

## 2019-05-24 LAB — BPAM FFP
Blood Product Expiration Date: 202101072359
Blood Product Expiration Date: 202101072359
ISSUE DATE / TIME: 202101031038
ISSUE DATE / TIME: 202101031038
Unit Type and Rh: 7300
Unit Type and Rh: 7300

## 2019-05-24 LAB — BPAM RBC
Blood Product Expiration Date: 202101252359
Blood Product Expiration Date: 202101272359
ISSUE DATE / TIME: 202101030237
ISSUE DATE / TIME: 202101030328
Unit Type and Rh: 5100
Unit Type and Rh: 7300

## 2019-05-24 LAB — URINE CULTURE: Culture: NO GROWTH

## 2019-05-25 LAB — PATHOLOGIST SMEAR REVIEW

## 2019-05-28 LAB — CULTURE, BLOOD (ROUTINE X 2)
Culture: NO GROWTH
Culture: NO GROWTH

## 2019-06-21 NOTE — Progress Notes (Signed)
PCCM Interval Note:  Goals of care discussion with daughter, Chelsea Hensley. She notes that her mother has had a very difficult and prolonged illness. She has been decompensating due to her sickle cell disease and has suffered from chronic pain as a result of this disease. Discussed her current hospitalization and guarded prognosis given multiorgan failure s/p cardiac arrest. At this time, Ms. Chelsea Hensley would like to pursue comfort care for her mother and notes that this is what her mother would have wanted. She and her son will visit with the patient today and are in support of withdrawing care.   Harvie Heck, MD Internal Medicine, PGY-1 06-17-19 1:46 PM

## 2019-06-21 NOTE — H&P (Signed)
NAME:  Chelsea Hensley, MRN:  [id number], DOB:  Mar 29, 1946, LOS: 0 ADMISSION DATE:  2019/05/31, CONSULTATION DATE:  05/31/2019 REFERRING MD: Betsey Holiday , CHIEF COMPLAINT: Cardiac arrest  Brief History   74 year old female with history of sickle cell disease,  CKD who had been complaining of about 1 day of abdominal pain.  Daughter witnessed her collapse on the floor, did not start CPR until EMS arrived around 5 minutes later.  Per report patient was in V. fib arrest, underwent 14 minutes of CPR defibrillated x1, epi x1.  ROSC achieved, intubated in the ED. PCCM consulted for admission  History of present illness   74 year old female with a history of sickle cell anemia, gout, CKD on chronic opioid therapy who complained of about 1 day of severe upper abdominal pain per her daughter.  She had also noted recent worsening leg edema and was started on Bumex by her PCP.  Patient slumped over at home and was unresponsive, reported about 5 minutes before EMS arrived and started CPR.  Per report patient was in V. fib arrest, underwent 14 minutes of CPR defibrillated x1, epi x1.  On arrival to the ED, patient was hypotensive and hypothermic, is initiating breaths on her own but was otherwise unresponsive and was intubated for airway protection.  CT head negative, CT abdomen pelvis noted dense bilateral lower lobe consolidations along with pancreatic edema suspicious for pancreatitis, global cardiomegaly.  Labs grossly abnormal with elevated ammonia, lipase, potassium 7.5, creatinine 4.3, hemoglobin 5.4, white blood cell count 22K lactic acid greater than 11.  She was given IV fluids, 3 amps of bicarb, Vanco and Zosyn, IV insulin, calcium, and 1 unit PRBCs.  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   Significant Hospital Events   1/3 admit to  PCCM  Consults:  Cardiology  Procedures:  1/3 intubated  Significant Diagnostic Tests:  1/3 CT head>> no acute findings 1/3 CT abdomen pelvis>>Dense bilateral lower lobe consolidations, Slightly indistinct appearance of the pancreatic head with suggestion of peripancreatic edema, question cirrhosis  Micro Data:  1/3 COVID-19 and influenza>> negative 1/3 urine culture>> 1/3 blood cultures x2>>  Antimicrobials:  Vancomycin 1/2- Zosyn 1/2-  Interim history/subjective:  Patient remained unresponsive post intubation  Objective   Blood pressure (!) 117/45, pulse 83, temperature (!) 90 F (32.2 C), temperature source Bladder, resp. rate 14, weight 63 kg, SpO2 (!) 0 %.        Intake/Output Summary (Last 24 hours) at 05/31/19 0342 Last data filed at 05/31/19 4098 Gross per 24 hour  Intake 630 ml  Output 100 ml  Net 530 ml   Filed Weights   2019/05/31 0055  Weight: 63 kg    General: Elderly, chronically ill-appearing female intubated HEENT: MM pink/moist, ET tube in place Neuro: Pupils 2 mm equal and responsive, no gag reflex, no corneal reflex, breathing over the vent, no purposeful movement, does not withdraw to pain CV: s1s2 regular rate and rhythm, no m/r/g PULM: Wheezing in all lung fields GI: soft, bsx4 active  Extremities: warm/dry, no edema  Skin: no rashes or lesions   Resolved Hospital Problem list   None  Assessment & Plan:   Witnessed V. fib cardiac arrest -ROSC after questionable 20 minutes of downtime, temp less than 39F in the ED excludes from cooling  P: -Maintain normothermia, -No STEMI on EKG,  trend troponins, check echo --Maintain full vent support with SAT/SBT as tolerated -titrate Vent setting to maintain SpO2 greater than or equal to 90%. -HOB elevated 30 degrees. -Plateau pressures less than 30 cm H20.  -Follow chest x-ray, ABG prn.   -Bronchial hygiene and RT/bronchodilator protocol.    Septic shock with AGMA secondary to CAP possible  concurrent UTI -Initial lactic acid greater than 11 -Received 30 cc/kg of IV fluid P: -Broad coverage with Vanco, Zosyn -On peripheral Levophed -Blood and urine cultures pending -Trend lactic acid -Given 3 amps of bicarb in the ED and started on bicarb drip   Pancreatitis -Per daughter patient is a drinker, suspect alcoholic -Check triglycerides -Continue IV fluids, repeat lipase -No necrosis on CT   Acute on chronic renal failure with hyperkalemia -Received bicarb, 10 units IV insulin, calcium in the ED P: -Trend BMP, continue bicarb drip, scheduled 5 mg albuterol nebs which will treat both wheezing on lung exam and hyperkalemia   Cirrhosis with elevated ammonia and coagulopathy -Trend LFTs, lactulose p.o., though some gastric output from OG tube, may need enemas -INR greater than 6.5, given vitamin K -Give albumin   Sickle cell anemia -Hemoglobin 5.4, CP 2 units PRBC in the emergency department P: -Trend H&H, no obvious bleeding -Check reticulocyte count  Encephalitis -CT head negative, likely multifactorial secondary to hepatic encephalopathy, uremia, possible anoxic injury following radiographs -Check EEG    *Very poor prognosis in this patient with sickle cell disease, cardiac arrest with multiorgan failure and sepsis.  I spoke with patient's daughter Chelsea Hensley who changed CODE STATUS to DNR.  She understands patient's tenuous status and low likelihood of recovery and will likely change to comfort care.  She would like to discuss this with family and see how patient does over the next 12 hours.     Best practice:  Diet: N.p.o. Pain/Anxiety/Delirium protocol (if indicated): Propofol fentanyl VAP protocol (if indicated): Yes DVT prophylaxis: SCDs GI prophylaxis: Protonix Glucose control: Sliding scale insulin Mobility: Bedrest Code Status: DNR Family Communication: Spoke with patient's daughter  disposition: ICU  Labs   CBC: Recent Labs  Lab  20-Jun-2019 0047 06/20/2019 0103  WBC 22.0*  --   NEUTROABS 18.1*  --   HGB 5.4* 5.1*  HCT 17.1* 15.0*  MCV 114.8*  --   PLT 178  --     Basic Metabolic Panel: Recent Labs  Lab 06/20/19 0047 06-20-2019 0103  NA 136 134*  K >7.5* 7.5*  CL 104 109  CO2 7*  --   GLUCOSE 136* 120*  BUN 87* 98*  CREATININE 3.99* 4.30*  CALCIUM 8.0*  --    GFR: Estimated Creatinine Clearance: 10.4 mL/min (A) (by C-G formula based on SCr of 4.3 mg/dL (H)). Recent Labs  Lab 2019-06-20 0047  WBC 22.0*  LATICACIDVEN >11.0*    Liver Function Tests: Recent Labs  Lab June 20, 2019 0047  AST 341*  ALT 145*  ALKPHOS 101  BILITOT 17.6*  PROT 4.3*  ALBUMIN 2.8*   Recent Labs  Lab 06-20-19 0047  LIPASE 360*   Recent Labs  Lab 20-Jun-2019 0120  AMMONIA 270*    ABG    Component Value Date/Time   TCO2 9 (L) June 20, 2019 0103     Coagulation Profile: Recent Labs  Lab 06-20-19 0047  INR 6.7*    Cardiac Enzymes: No results for input(s): CKTOTAL, CKMB, CKMBINDEX, TROPONINI in the last 168 hours.  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   Surgical History    Past Surgical History:  Procedure Laterality Date  . ABDOMINAL HYSTERECTOMY    . CATARACT EXTRACTION, BILATERAL    . CHOLECYSTECTOMY    . HERNIA REPAIR    . TOTAL ABDOMINAL HYSTERECTOMY W/ BILATERAL SALPINGOOPHORECTOMY Bilateral      Social History   reports that she quit smoking about 12 years ago. Her smoking use included cigarettes. She smoked 1.00 pack per day. She has never used smokeless tobacco. She reports current alcohol use. She reports that she does not use drugs.   Family History   Her family history  is not on file.   Allergies Allergies  Allergen Reactions  . Quinolones Anaphylaxis    All vitals and pressures dropped, blacked out and had seizures     Home Medications  Prior to Admission medications   Medication Sig Start Date End Date Taking? Authorizing Provider  allopurinol (ZYLOPRIM) 100 MG tablet Take 0.5 tablets (50 mg total) by mouth daily. 04/22/19   Dorena Dew, FNP  folic acid (FOLVITE) 1 MG tablet Take 1 tablet (1 mg total) by mouth daily. 01/24/16   Micheline Chapman, NP  furosemide (LASIX) 20 MG tablet Take 40 mg by mouth daily as needed for fluid.     [provider]  Multiple Vitamin (MULTIVITAMIN WITH MINERALS) TABS tablet Take 1 tablet by mouth daily.    [provider]  naloxone [facility]) nasal spray 4 mg/0.1 mL Place 0.4 mg into the nose once.  04/02/18   [provider]  oxyCODONE (OXYCONTIN) 80 mg 12 hr tablet Take 1 tablet (80 mg total) by mouth every 12 (twelve) hours as needed (for severe pain). 01/24/16   Micheline Chapman, NP     Critical care time: 65 minutes    CRITICAL CARE Performed by: Otilio Carpen Bradlee Bridgers   Total critical care time: 65 minutes  Critical care time was exclusive of separately billable procedures and treating other patients.  Critical care was necessary to treat or prevent imminent or life-threatening deterioration.  Critical care was time spent personally by me on the following activities: development of treatment plan with patient and/or surrogate as well as nursing, discussions with consultants, evaluation of patient's response to treatment, examination of patient, obtaining history from patient or surrogate, ordering and performing treatments and interventions, ordering and review of laboratory studies, ordering and review of radiographic studies, pulse oximetry and re-evaluation of patient's condition.   Otilio Carpen Shakeda Pearse, PA-C [place] [facility]  Pager# [phone], if no answer [phone]

## 2019-06-21 NOTE — Death Summary Note (Signed)
  Name: Chelsea Hensley MRN: [id number] DOB: 1946/02/27 74 y.o.  Date of Admission: 2019-06-06 12:34 AM Date of Discharge: 06/06/19 Attending Physician: Margaretha Seeds, MD  Discharge Diagnosis: Multiorgan failure s/p cardiac arrest   Cause of death: Acute on chronic respiratory failure in setting of multiorgan failure s/p cardiac arrest  Time of death: 2019/06/06 17:50  Disposition and follow-up:   Ms.Chelsea Hensley was discharged from [facility] in expired condition.    Hospital Course: Ms. Chelsea Hensley is a 74year old female with history of sickle cell disease, chronic hypoxemic respiratory failure, CKD and ACD admitted post V.fib cardiac arrest s/p defib x1 with ROSC achieved. Patient presented with hypotension and hypothermia. She was intubated in the ED. She was started on levophed for BP support. She was noted to have Hb 5.1 for which she received 2U pRBC with appropriate response. She was also noted to be severely hyperkalemic at 7.5 on arrival for which she was given calcium gluconate, insulin and kayexalate. She was started on sodium bicarb drip. She was also noted to have elevated INR of 6.7 and given vitamin K. CT Abdomen/pelvis with bibasilar consolidations and pacreatic edema suggestive of pancreatitis. Patient also noted to have lactic acid >11 for which patient started on broad spectrum antibiotics.  She was admitted to ICU with multiorgan failure following cardiac arrest. She had acute encephalopathy 2/2 cardiac arrest, sock and hyperammonia (ammonia 270). EEG with diffuse general background slowing w/o epileptiform activity noted. She remained intubated and on full vent support for acute on chronic hypoxemic respiratory failure. She had acute renal failure with anion gap acidosis, lactic acidosis and hyperkalemia for which she was continued on sodium bicarb drip.  She was also noted to have transaminitis with elevated INR and received 2U pRBC following vitamin K  administration.  Upon goals of care discussion with family, decision made to pursue comfort care for patient. With family at bedside and via televisit, patient extubated and weaned off pressor support.   Signed: Harvie Heck, MD  Internal Medicine, PGY-1 06-06-2019, 6:54 PM

## 2019-06-21 NOTE — Progress Notes (Signed)
[facility] Progress Note Patient Name: Chelsea Hensley DOB: 07-03-1945 MRN: [id number]   Date of Service  2019-06-08  HPI/Events of Note  Hyperkalemia - K+ = 6.7.   eICU Interventions  Will order: 1. Calcium gluconate 1 gm IV now. 2. NaHCO3 100 meq IV now. 3. D50 1 amp IV now. 4. Novolog 10 units IV now. 5. Kayexalate 30 gm per tube now. 6. ABG now. 7. Repeat BMP at 12 Noon.      Intervention Category Major Interventions: Electrolyte abnormality - evaluation and management  [name] June 08, 2019, 6:17 AM

## 2019-06-21 NOTE — Progress Notes (Signed)
EEG complete - results pending 

## 2019-06-21 NOTE — ED Notes (Signed)
Dr Betsey Holiday updated Daughter Dimple Nanas

## 2019-06-21 NOTE — Progress Notes (Signed)
  Echocardiogram 2D Echocardiogram has been performed.  Chelsea Hensley 06-22-2019, 12:50 PM

## 2019-06-21 NOTE — Progress Notes (Signed)
CRITICAL VALUE ALERT  Critical Value:  20.8 Bilirubin  Date & Time Notied:  06/19/2019 1315  Provider Notified: Loanne Drilling, MD  Orders Received/Actions taken: None

## 2019-06-21 NOTE — Progress Notes (Signed)
Pharmacy Antibiotic Note  Chelsea Hensley is a 74 y.o. female admitted on 2019/06/15 with s/p cardiac arrest.  Pharmacy has been consulted for Vancomycin/Zosyn dosing for r/o sepsis and aspiration PNA. WBC elevated. Noted acute on chronic renal failure. Lactic acid markedly elevated.   Plan: Vancomycin 1500 mg IV x 1 >>Check random vancomycin level in 24 hours to assess clearance Zosyn 2.25g IV q8h Trend WBC, temp, renal function  F/U infectious work-up Drug levels as indicated  Weight: 138 lb 14.2 oz (63 kg)  Temp (24hrs), Avg:94.1 F (34.5 C), Min:89.3 F (31.8 C), Max:98.9 F (37.2 C)  Recent Labs  Lab 15-Jun-2019 0047 06/15/19 0103  WBC 22.0*  --   CREATININE 3.99* 4.30*  LATICACIDVEN >11.0*  --     Estimated Creatinine Clearance: 10.4 mL/min (A) (by C-G formula based on SCr of 4.3 mg/dL (H)).    Allergies  Allergen Reactions  . Quinolones Anaphylaxis    All vitals and pressures dropped, blacked out and had seizures    Narda Bonds, PharmD, BCPS Clinical Pharmacist Phone: [phone]

## 2019-06-21 NOTE — ED Notes (Signed)
ED at bedside for CVC placement EMS epi drip stopped

## 2019-06-21 NOTE — ED Provider Notes (Signed)
[facility] Provider Note   CSN: [id number] Arrival date & time: Jun 01, 2019  0034     History Chief Complaint  Patient presents with  . CPR    Chelsea Hensley is a 74 y.o. female.  Patient brought to the emergency department from home by EMS.  Patient had been experiencing low back pain and leg pain for more than 1 day.  Daughter reports that the patient has a history of sickle cell disease and she thought that the patient was complaining of pain because of sickle cell.  She took her normal MS Contin tablets for the pain without much relief.  Daughter was with patient tonight when the patient suddenly slumped over and became unresponsive.          Past Medical History:  Diagnosis Date  . Gout   . Hyperuricemia   . Sickle cell anemia (HCC)   . 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 Cardiac arrest ([place]) 06/01/19  . AKI (acute kidney injury) ([place])   . Symptomatic anemia 04/20/2019  . Acute-on-chronic kidney injury ([place]) 04/20/2019  . Disorientation   . Acute on chronic respiratory failure with hypoxia ([place]) 05/26/2018  . CKD (chronic kidney disease) 05/26/2018  . Sickle cell crisis ([place]) 05/26/2018  . Sickle cell pain crisis ([place])   . Left arm pain 10/15/2017  . Gout 10/14/2017  . Left wrist pain 10/14/2017  . Dyspnea on exertion 12/24/2016  . Anemia 12/02/2015  . Sickle cell disease ([place]) 12/02/2015    Past Surgical History:  Procedure Laterality Date  . ABDOMINAL HYSTERECTOMY    . CATARACT EXTRACTION, BILATERAL    . CHOLECYSTECTOMY    . HERNIA REPAIR    . TOTAL ABDOMINAL HYSTERECTOMY W/ BILATERAL SALPINGOOPHORECTOMY Bilateral      OB History   No obstetric history on file.     No family history on file.  Social History   Tobacco Use  . Smoking status: Former Smoker    Packs/day: 1.00    Types: Cigarettes    Quit  date: 12/25/2006    Years since quitting: 12.4  . Smokeless tobacco: Never Used  . Tobacco comment: pt states that she was in her 63s when she started smoking; cannot remember exactly  Substance Use Topics  . Alcohol use: Yes    Comment: rare  . Drug use: No    Home Medications Prior to Admission medications   Medication Sig Start Date End Date Taking? Authorizing Provider  allopurinol (ZYLOPRIM) 100 MG tablet Take 0.5 tablets (50 mg total) by mouth daily. 04/22/19   Dorena Dew, FNP  folic acid (FOLVITE) 1 MG tablet Take 1 tablet (1 mg total) by mouth daily. 01/24/16   Micheline Chapman, NP  furosemide (LASIX) 20 MG tablet Take 40 mg by mouth daily as needed for fluid.     [provider]  Multiple Vitamin (MULTIVITAMIN WITH MINERALS) TABS tablet Take 1 tablet by mouth daily.    [provider]  naloxone [facility]) nasal spray 4 mg/0.1 mL Place 0.4 mg into the nose once.  04/02/18   [provider]  oxyCODONE (OXYCONTIN) 80 mg 12 hr tablet Take 1 tablet (80 mg total) by mouth every 12 (twelve) hours as needed (for severe pain). 01/24/16   Micheline Chapman, NP    Allergies    Quinolones  Review of Systems  Review of Systems  Unable to perform ROS: Acuity of condition    Physical Exam Updated Vital Signs BP (!) 117/45   Pulse 83   Temp (!) 90 F (32.2 C) (Bladder)   Resp 14   Wt 63 kg   SpO2 (!) 0%   BMI 24.60 kg/m   Physical Exam Vitals and nursing note reviewed.  Constitutional:      General: She is in acute distress.     Appearance: She is ill-appearing.  HENT:     Head: Atraumatic.     Nose:     Comments: Blood noted at nares bilaterally Eyes:     Comments: Right pupil 4, non-reactive Left pupil 3, non-reactive  Cardiovascular:     Rate and Rhythm: Rhythm irregular.  Pulmonary:     Breath sounds: Rhonchi and rales present.  Abdominal:     General: There is distension.  Musculoskeletal:     Cervical back: Neck supple.      Right lower leg: Edema present.     Left lower leg: Edema present.  Skin:    Findings: No rash.  Neurological:     Comments: Unresponsive      ED Results / Procedures / Treatments   Labs (all labs ordered are listed, but only abnormal results are displayed) Labs Reviewed  CBC WITH DIFFERENTIAL/PLATELET - Abnormal; Notable for the following components:      Result Value   WBC 22.0 (*)    RBC 1.49 (*)    Hemoglobin 5.4 (*)    HCT 17.1 (*)    MCV 114.8 (*)    MCH 36.2 (*)    RDW 27.5 (*)    nRBC 8.5 (*)    Neutro Abs 18.1 (*)    Monocytes Absolute 1.3 (*)    Abs Immature Granulocytes 0.67 (*)    All other components within normal limits  COMPREHENSIVE METABOLIC PANEL - Abnormal; Notable for the following components:   Potassium >7.5 (*)    CO2 7 (*)    Glucose, Bld 136 (*)    BUN 87 (*)    Creatinine, Ser 3.99 (*)    Calcium 8.0 (*)    Total Protein 4.3 (*)    Albumin 2.8 (*)    AST 341 (*)    ALT 145 (*)    Total Bilirubin 17.6 (*)    GFR calc non Af Amer 10 (*)    GFR calc Af Amer 12 (*)    Anion gap 25 (*)    All other components within normal limits  PROTIME-INR - Abnormal; Notable for the following components:   Prothrombin Time 58.8 (*)    INR 6.7 (*)    All other components within normal limits  LACTIC ACID, PLASMA - Abnormal; Notable for the following components:   Lactic Acid, Venous >11.0 (*)    All other components within normal limits  LIPASE, BLOOD - Abnormal; Notable for the following components:   Lipase 360 (*)    All other components within normal limits  URINALYSIS, ROUTINE W REFLEX MICROSCOPIC - Abnormal; Notable for the following components:   Color, Urine AMBER (*)    APPearance CLOUDY (*)    Glucose, UA 50 (*)    Hgb urine dipstick LARGE (*)    Bilirubin Urine MODERATE (*)    Ketones, ur 5 (*)    Protein, ur >=300 (*)    Bacteria, UA MANY (*)    All other components within normal limits  RAPID URINE DRUG SCREEN,  HOSP PERFORMED -  Abnormal; Notable for the following components:   Opiates POSITIVE (*)    All other components within normal limits  AMMONIA - Abnormal; Notable for the following components:   Ammonia 270 (*)    All other components within normal limits  CBG MONITORING, ED - Abnormal; Notable for the following components:   Glucose-Capillary 116 (*)    All other components within normal limits  I-STAT CHEM 8, ED - Abnormal; Notable for the following components:   Sodium 134 (*)    Potassium 7.5 (*)    BUN 98 (*)    Creatinine, Ser 4.30 (*)    Glucose, Bld 120 (*)    Calcium, Ion 1.05 (*)    TCO2 9 (*)    Hemoglobin 5.1 (*)    HCT 15.0 (*)    All other components within normal limits  TROPONIN I (HIGH SENSITIVITY) - Abnormal; Notable for the following components:   Troponin I (High Sensitivity) 29 (*)    All other components within normal limits  RESPIRATORY PANEL BY RT PCR (FLU A&B, COVID)  URINE CULTURE  SALICYLATE LEVEL  ACETAMINOPHEN LEVEL  BASIC METABOLIC PANEL  BASIC METABOLIC PANEL  PROTIME-INR  APTT  BLOOD GAS, ARTERIAL  BLOOD GAS, ARTERIAL  POC SARS CORONAVIRUS 2 AG -  ED  I-STAT ARTERIAL BLOOD GAS, ED  TYPE AND SCREEN  PREPARE RBC (CROSSMATCH)  TROPONIN I (HIGH SENSITIVITY)    EKG EKG Interpretation  Date/Time:  06/20/2019 01:01:46 EST Ventricular Rate:  86 PR Interval:    QRS Duration: 183 QT Interval:  469 QTC Calculation: 552 R Axis:   74 Text Interpretation: Atrial fibrillation Ventricular premature complex Right bundle branch block Borderline ST depression, lateral leads Baseline wander in lead(s) V3 Confirmed by Orpah Greek [phone]) on 06/20/19 2:01:04 AM   Radiology CT ABDOMEN PELVIS WO CONTRAST  Result Date: 06/20/19 CLINICAL DATA:  Abdominal distension unresponsive EXAM: CT ABDOMEN AND PELVIS WITHOUT CONTRAST TECHNIQUE: Multidetector CT imaging of the abdomen and pelvis was performed following the standard protocol without IV contrast.  COMPARISON:  None. FINDINGS: Lower chest: Lung bases demonstrate dense bilateral lower lobe consolidations with partial consolidations in the right middle lobe and lingula. Cyst or cavitary focus in the posteromedial left base with fluid level. Cardiomegaly with multi chamber enlargement. Hepatobiliary: Lobulated liver contour with left hepatic lobe enlargement. Status post cholecystectomy. No significant biliary dilatation. Pancreas: Indistinct appearance of pancreas head. Peripancreatic edema and haziness. No ductal dilatation Spleen: Calcified atrophied spleen in the left upper quadrant. Adrenals/Urinary Tract: Adrenal glands within normal limits. No hydronephrosis. Multiple cysts in the kidneys. Multiple hyperdense nodules in the kidneys probably proteinaceous or hemorrhagic cysts. Punctate stone mid right kidney. Thick-walled bladder with indistinct wall Stomach/Bowel: Esophageal tube tip in the gastric body. Small amount of radiodense material within the stomach. No dilated small bowel. Bowel anastomosis in the right lower quadrant. No colon wall thickening. Vascular/Lymphatic: Moderate aortic atherosclerosis without aneurysm. No suspicious adenopathy. Gas in the left common femoral vein likely iatrogenic. Right groin catheter with tip in the iliac vein. Reproductive: Status post hysterectomy. No adnexal masses. Other: No free air. Small free fluid in the pelvis. Small amount of perihepatic ascites. Generalized subcutaneous edema consistent with anasarca Musculoskeletal: Heterogenous mineralization. Sclerosis and lucency within the osseous structures. Endplate deformities at the vertebra. Fusion of the L1 and L2 vertebral bodies. Findings felt related to history of sickle cell. IMPRESSION: 1. Dense bilateral lower lobe consolidations with partial consolidations in  the right middle lobe and lingula suspect for multifocal pneumonia. Aspiration at the bilateral lower lobes is also a consideration. Cyst or  cavitary lesion within the medial left lung base with fluid level consistent with acute infection. 2. Slightly indistinct appearance of the pancreatic head with suggestion of peripancreatic edema. Fluid in the bilateral pararenal spaces. Correlate with appropriate enzymes to evaluate for pancreatitis. 3. Thick-walled appearance of urinary bladder which is indistinct, suggest correlation with urinalysis to assess for cystitis 4. Globular cardiomegaly. Small amount of ascites within the abdomen and pelvis. Generalized subcutaneous edema consistent with anasarca. 5. Slightly lobulated liver contour, question cirrhosis 6. Punctate nonobstructing stone right kidney. Multiple renal cysts. Small hyperdense nodules within both kidneys, probable proteinaceous or hemorrhagic cysts. Electronically Signed   By: Donavan Foil M.D.   On: 24-May-2019 02:19   CT Head Wo Contrast  Result Date: 05-24-2019 CLINICAL DATA:  Altered mental status EXAM: CT HEAD WITHOUT CONTRAST TECHNIQUE: Contiguous axial images were obtained from the base of the skull through the vertex without intravenous contrast. COMPARISON:  None. FINDINGS: Brain: No acute territorial infarction, hemorrhage, or intracranial mass. Ventricles are nonenlarged. Mild atrophy and minimal small vessel ischemic change of the white matter Vascular: No hyperdense vessels.  Carotid vascular calcification Skull: No fracture. Heterogenous mineralization which may be due to osteopenia or marrow infiltrative process. Sinuses/Orbits: Mild mucosal thickening in the sphenoid and ethmoid sinuses. Other: None IMPRESSION: 1. No CT evidence for acute intracranial abnormality. 2. Minimal atrophy and small vessel ischemic change of the white matter Electronically Signed   By: Donavan Foil M.D.   On: 05/24/2019 02:05   DG Chest Port 1 View  Result Date: 2019-05-24 CLINICAL DATA:  Intubation. EXAM: PORTABLE CHEST 1 VIEW COMPARISON:  Chest radiograph 10/14/2017 FINDINGS: Endotracheal  tube tip borderline low in positioning 15 mm from the carina. Enteric tube in place with tip and side-port below the diaphragm, not included in the field of view. Cardiomegaly. Slightly gallbladder appearance of the heart may represent pericardial effusion. Interstitial opacities in the right greater than left lung. No pneumothorax or large pleural effusion. No acute osseous abnormalities are seen. IMPRESSION: 1. Endotracheal tube tip 15 mm from the carina, borderline low in positioning. 2. Enteric tube in place with tip and side-port below the diaphragm. 3. Interstitial opacities in the right greater than left lung, favor asymmetric pulmonary edema over atypical infection. 4. Chronic cardiomegaly. Slightly globular appearance of the cardiac silhouette may represent underlying pericardial effusion. Electronically Signed   By: Keith Rake M.D.   On: May 24, 2019 01:48    Procedures Procedure Name: Intubation Date/Time: May 24, 2019 3:37 AM Performed by: Orpah Greek, MD Pre-anesthesia Checklist: Patient identified, Emergency Drugs available, Suction available, Timeout performed and Patient being monitored Oxygen Delivery Method: Ambu bag Preoxygenation: Pre-oxygenation with 100% oxygen Induction Type: Rapid sequence Ventilation: Mask ventilation without difficulty Laryngoscope Size: Glidescope and 3 Grade View: Grade I Tube size: 7.5 mm Number of attempts: 1 Placement Confirmation: ETT inserted through vocal cords under direct vision,  CO2 detector and Breath sounds checked- equal and bilateral Secured at: 22 cm Tube secured with: ETT holder    .Central Line  Date/Time: 05/24/19 3:38 AM Performed by: Orpah Greek, MD Authorized by: Orpah Greek, MD   Consent:    Consent obtained:  Emergent situation Universal protocol:    Imaging studies available: yes     Required blood products, implants, devices, and special equipment available: yes     Site/side  marked: yes  Immediately prior to procedure, a time out was called: yes     Patient identity confirmed:  Hospital-assigned identification number Pre-procedure details:    Hand hygiene: Hand hygiene performed prior to insertion     Sterile barrier technique: All elements of maximal sterile technique followed     Skin preparation:  2% chlorhexidine   Skin preparation agent: Skin preparation agent completely dried prior to procedure   Anesthesia (see MAR for exact dosages):    Anesthesia method:  None Procedure details:    Location:  R internal jugular   Patient position:  Flat   Procedural supplies:  Triple lumen   Catheter size:  7.5 Fr   Landmarks identified: yes     Ultrasound guidance: yes     Number of attempts:  2   Successful placement: no (Blood was very bright red and there was some intermittent pulsations noted, could not rule out arterial puncture)   Post-procedure details:    Post-procedure:  Dressing applied .Central Line  Date/Time: May 24, 2019 3:39 AM Performed by: Orpah Greek, MD Authorized by: Orpah Greek, MD   Consent:    Consent obtained:  Emergent situation Universal protocol:    Imaging studies available: yes     Required blood products, implants, devices, and special equipment available: yes     Site/side marked: yes     Immediately prior to procedure, a time out was called: yes     Patient identity confirmed:  Hospital-assigned identification number Pre-procedure details:    Hand hygiene: Hand hygiene performed prior to insertion     Sterile barrier technique: All elements of maximal sterile technique followed     Skin preparation:  2% chlorhexidine   Skin preparation agent: Skin preparation agent completely dried prior to procedure   Anesthesia (see MAR for exact dosages):    Anesthesia method:  None Procedure details:    Location:  R femoral   Patient position:  Flat   Procedural supplies:  Triple lumen   Catheter size:  7.5  Fr   Landmarks identified: yes     Ultrasound guidance: yes     Sterile ultrasound techniques: Sterile gel and sterile probe covers were used     Number of attempts:  1   Successful placement: yes   Post-procedure details:    Post-procedure:  Dressing applied   Assessment:  Blood return through all ports and free fluid flow   Patient tolerance of procedure:  Tolerated well, no immediate complications .Critical Care Performed by: Orpah Greek, MD Authorized by: Orpah Greek, MD   Critical care provider statement:    Critical care time (minutes):  45   Critical care time was exclusive of:  Separately billable procedures and treating other patients   Critical care was necessary to treat or prevent imminent or life-threatening deterioration of the following conditions:  Respiratory failure, circulatory failure, CNS failure or compromise, shock and metabolic crisis   Critical care was time spent personally by me on the following activities:  Ordering and performing treatments and interventions, ordering and review of laboratory studies, discussions with consultants, ordering and review of radiographic studies, pulse oximetry, evaluation of patient's response to treatment, re-evaluation of patient's condition, examination of patient, review of old charts and obtaining history from patient or surrogate   I assumed direction of critical care for this patient from another provider in my specialty: no     (including critical care time)  Medications Ordered in ED Medications  propofol (DIPRIVAN) 1000  MG/100ML infusion (has no administration in time range)  0.9 %  sodium chloride infusion (Manually program via Guardrails IV Fluids) (has no administration in time range)  vancomycin (VANCOREADY) IVPB 1500 mg/300 mL (1,500 mg Intravenous New Bag/Given Jun 14, 2019 0302)  piperacillin-tazobactam (ZOSYN) IVPB 2.25 g (0 g Intravenous Stopped 06/14/2019 0333)  sodium bicarbonate 150 mEq in  sterile water 1,000 mL infusion (has no administration in time range)  insulin aspart (novoLOG) injection 10 Units (has no administration in time range)  dextrose 50 % solution 50 mL (has no administration in time range)  norepinephrine (LEVOPHED) 4mg  in 260mL premix infusion (has no administration in time range)  aspirin suppository 300 mg (has no administration in time range)  pantoprazole (PROTONIX) injection 40 mg (has no administration in time range)  0.9 %  sodium chloride infusion (has no administration in time range)  fentaNYL (SUBLIMAZE) injection 50 mcg (has no administration in time range)  fentaNYL 2595mcg in NS 272mL (67mcg/ml) infusion-PREMIX (has no administration in time range)  fentaNYL (SUBLIMAZE) bolus via infusion 25 mcg (has no administration in time range)  propofol (DIPRIVAN) 1000 MG/100ML infusion (has no administration in time range)  norepinephrine (LEVOPHED) 4-5 MG/250ML-% infusion SOLN (10 mg  New Bag/Given 06/14/19 0107)  calcium chloride injection 1 g (1 g Intravenous Given 06/14/19 0114)  sodium bicarbonate injection 50 mEq (50 mEq Intravenous Given 2019/06/14 0114)  sodium chloride 0.9 % bolus 1,000 mL (1,000 mLs Intravenous New Bag/Given 06/14/19 0229)    Followed by  sodium chloride 0.9 % bolus 1,000 mL (0 mLs Intravenous Stopped June 14, 2019 0257)  sodium bicarbonate injection 150 mEq (150 mEq Intravenous Given 2019/06/14 0310)    ED Course  I have reviewed the triage vital signs and the nursing notes.  Pertinent labs & imaging results that were available during my care of the patient were reviewed by me and considered in my medical decision making (see chart for details).    MDM Rules/Calculators/A&P                      Patient had been complaining of low back pain and leg pain for more than 1 day.  Patient's daughter was with her tonight.  Daughter reports that the patient suddenly slumped over and became unresponsive.  Daughter called 911.  She reports that she was  about to start CPR when first responders arrived, found the patient without pulses and initiated CPR.  Paramedics estimate at least 5 minutes of downtime before CPR was started.  EMS continued CPR.  EMS established an airway with a King's airway.  They report a total time of 14 minutes of CPR.  During that time she received 1 dose of epi by push and then started on a drip.  She was also was shocked 1 time for V. fib.  Patient regained spontaneous circulation and was brought to the ER.  There was reportedly some bleeding from the nose and mouth after placement of the King's airway.  At arrival patient's pupils are not reactive.  She is not responding to any pain but is initiating breaths on her own.  Breathing was assisted via the King's airway.  She was intubated without difficulty.  Patient was switched to Levophed.  I did attempt a right internal jugular central line.  When I entered the vessel, which appeared to be internal jugular under direct visualization with ultrasound, blood return was very bright and I was concerned that it was arterial.  This was therefore aborted  and line was placed in the right femoral vein.  Patient did have a distended abdomen on arrival.  This was concerning for possible aortic dissection or AAA as a cause of her back pain prior to her arrest.  She is in acute renal failure and cannot get IV dye.  Noncontrast CT, however, did not show any significant AAA or collection of fluid.  Head CT was also unremarkable.  Patient is noted to be very anemic.  Her baseline is around 7 because of her sickle cell disease.  The blood obtained for labs was very thin looking and I suspect that her hemoglobin is even lower than the 5 that we saw on initial blood draw.  I did order 2 units of emergency blood.  This was discussed with Dr. Claudette Laws, pathology.  Patient has significant antibodies but it is felt that she requires emergent noncrossmatched blood.  Patient significantly acidotic.   She also has hyperkalemia.  Patient administered calcium and bicarb for her hyperkalemia.  Blood gas confirms a significant acidosis with a pH of 6.7.  Discussed with Dr. Oletta Darter, on-call for [facility].  He recommends 3 additional units of bicarb and then initiating a bicarb drip.  Patient will be further evaluated by critical care and admitted to the ICU.  Final Clinical Impression(s) / ED Diagnoses Final diagnoses:  Cardiac arrest [facility])    Rx / DC Orders ED Discharge Orders    None       Wilkes Potvin, Gwenyth Allegra, MD 06/03/2019 [phone]

## 2019-06-21 NOTE — ED Notes (Signed)
Pt arrived with [facility] airway in place, +pulses, pupils unequal and nonreactive.  4171 Jennet Maduro removed,  0043 Etomidate $RemoveBefore'20mg'[id number]$  IVP, Succs $RemoveBe'100mg'oTmbFcBCR$  0045 7.5 ETT placed, 22 lip 0048 16G OG placed by [place] O. Per MD blood noted in airway.

## 2019-06-21 NOTE — Procedures (Signed)
Extubation Procedure Note  Patient Details:   Name: Christien Berthelot DOB: January 17, 1946 MRN: [id number]   Airway Documentation:  Airway 7.5 mm (Active)  Secured at (cm) 23 cm 06/01/19 1457  Measured From Lips June 01, 2019 [place] 06/01/2019 1457  Secured By [employer] 2019/06/01 1457  Tube Holder Repositioned Yes Jun 01, 2019 1457  Cuff Pressure (cm H2O) 29 cm H2O Jun 01, 2019 0853  Site Condition Dry 2019-06-01 1457   Vent end date: (not recorded) Vent end time: (not recorded)   Evaluation    Patient extubated per withdrawal order. RN and family at bedside.  Herbie Saxon Loreley Schwall 2019-06-01, 5:26 PM

## 2019-06-21 NOTE — Procedures (Addendum)
Patient Name: Chelsea Hensley  MRN: [id number]  Epilepsy Attending: Lora Havens  Referring Physician/Provider: Elease Etienne, PA Duration: 08-Jun-2019 1120 to June 08, 2019 1635  Patient history: 74 year old female status post cardiac arrest.  Level of alertness: Comatose  AEDs during EEG study: Propofol  Technical aspects: This EEG study was done with scalp electrodes positioned according to the 10-20 International system of electrode placement. Electrical activity was acquired at a sampling rate of $Remov'500Hz'bKHsFw$  and reviewed with a high frequency filter of $RemoveB'70Hz'gFJYpMVS$  and a low frequency filter of $RemoveB'1Hz'mFUaRZpG$ . EEG data were recorded continuously and digitally stored.   Description: EEG initially howed continuous generalized background suppression.  Around 1300 on 06/08/2019, EEG started showing continuous generalized 2 to 3 Hz delta slowing. EEG was not reactive to tactile stimulation.  Hyperventilation photic summation were not performed.  Abnormality -Background suppression, generalized -Continuous slow, generalized  IMPRESSION: This study showed evidence of profound diffuse encephalopathy, nonspecific etiology. No seizures or epileptiform discharges were seen throughout the recording.  Chelsea Hensley Barbra Sarks

## 2019-06-21 NOTE — ED Triage Notes (Signed)
Pt transported from home by EMS after pt witnessed slumping over in chair, no CPR initially done by family, down @ 5 min, FD found pt pulseless and apneic, CPR started 1-2 min by FD, EMS CPR onset 2344, 1 epi, 1 shock with vfib, ROSC @ 7096, afib with frequent PVC's.  I/O RLE, epi gtt running, initial CBG 48 dextrose given 108 recheck. 1L NS given. Per family pt had c/o back and stomach pain throughout the day.

## 2019-06-21 NOTE — Progress Notes (Signed)
LTM EEG discontinued - no skin breakdown at unhook.   

## 2019-06-21 NOTE — Progress Notes (Signed)
Assisted tele visit to patient with family member.  Chan Sheahan, Janace Hoard, RN

## 2019-06-21 NOTE — Progress Notes (Signed)
[facility] Progress Note Patient Name: Chelsea Hensley DOB: [birth year] MRN: [id number]   Date of Service  06/21/19  HPI/Events of Note  ABG on 40%/PRVC /TV 400/P8 = 7.076/55.2/331.0  eICU Interventions  Will order: 1. NaHCO3 200 meq IV now.  2. NaHCO3 IV infusion at 100 mL/hour.  3. Repeat ABTG at 9 AM.     Intervention Category Major Interventions: Acid-Base disturbance - evaluation and management;Respiratory failure - evaluation and management  Lysle Dingwall 21-Jun-2019, 6:56 AM

## 2019-06-21 NOTE — Progress Notes (Signed)
PCCM Interval Note  Briefly, 74 year old female with sickle cell, chronic hypoxemic respiratory failure admitted post-cardiac arrest. EMS found patient in Vfib and defib x 1 with ROSC achieved. Admitted to [facility].  S: Arrived intubated and on vasopressor including levophed. On minimal vent settings.  Blood pressure 118/70, pulse (!) 103, temperature (!) 92.7 F (33.7 C), resp. rate 15, height $RemoveBe'5\' 2"'lolNbwVas$  (1.575 m), weight 72.7 kg, SpO2 95 %.  On my exam, critically ill female on mechanical ventilation. Lung exam with bilateral crackles. Cardiac exam with tachycardia and no murmur. 1+ pitting edema in lower extremities  CBC    Component Value Date/Time   WBC 23.9 (H) June 22, 2019 0653   RBC 2.28 (L) 22-Jun-2019 0653   RBC 2.28 (L) 06-22-2019 0653   HGB 7.1 (L) 2019-06-22 0903   HCT 21.0 (L) 06-22-2019 0903   PLT 115 (L) 06-22-2019 0653   MCV 102.2 (H) 06-22-19 0653   MCH 33.3 06/22/19 0653   MCHC 32.6 22-Jun-2019 0653   RDW 21.9 (H) 2019-06-22 0653   LYMPHSABS 1.9 06-22-19 0653   MONOABS 0.7 06/22/2019 0653   EOSABS 0.0 Jun 22, 2019 0653   BASOSABS 0.0 22-Jun-2019 0653   CMP     Component Value Date/Time   NA 145 2019/06/22 0903   K 5.5 (H) June 22, 2019 0903   CL 108 2019-06-22 0335   CO2 12 (L) 22-Jun-2019 0335   GLUCOSE 95 06/22/19 0335   BUN 80 (H) June 22, 2019 0335   CREATININE 3.42 (H) 06-22-19 0335   CREATININE 1.16 (H) 01/12/2016 1007   CALCIUM 7.6 (L) Jun 22, 2019 0335   PROT 4.3 (L) 06/22/19 0047   ALBUMIN 2.8 (L) 06-22-19 0047   AST 341 (H) 06/22/2019 0047   ALT 145 (H) June 22, 2019 0047   ALKPHOS 101 22-Jun-2019 0047   BILITOT 17.6 (H) 06/22/2019 0047   GFRNONAA 13 (L) Jun 22, 2019 0335   GFRNONAA 48 (L) 01/12/2016 1007   GFRAA 15 (L) 06-22-2019 0335   GFRAA 55 (L) 01/12/2016 1007   CT A/P - Bibasilar consolidations present  Assessment/Plan  Acute encephalopathy secondary to cardiac arrest, critical illness, shock, hyperammonia - F/u EEG. Continue sedation for RASS  goal -4 and -5. Lactulose started overnight. Management as below.   Acute on chronic respiratory failure secondary pneumonia, pulmonary HTN - Full vent support.    Cardiogenic vs Septic Shock - Continue broad spectrum antibiotics. Continue Levophed. Add vasopressin. Trend LA. IVF and reassess volume status  V fib arrest - Peak troponin 29. Supportive care. F/u echo. Trend troponin   Acute renal failure with AGA and lactic acidosis, hyperkalemia  - Pressor support as above. Trend lactic acid. Continue bicarbonate gtt. Recheck CMP at noon.  Transaminitis with elevated INR - S/p Vitamin K. RUQ [place]. Transfuse 2U FFP   DVT ppx: SCDs GI: PPI Family: Will update family  The patient is critically ill with multiple organ systems failure and requires high complexity decision making for assessment and support, frequent evaluation and titration of therapies, application of advanced monitoring technologies and extensive interpretation of multiple databases.   Critical Care Time devoted to patient care services described in this note is 50 Minutes. This time reflects time of care of this signee Dr. Rodman Pickle. This critical care time does not reflect procedure time, or teaching time or supervisory time of PA/NP/Med student/Med Resident etc but could involve care discussion time.  Discussed plan of care with resident and RN  Rodman Pickle, M.D. [facility] Pulmonary/Critical Care Medicine 06/22/19 9:52 AM   Please  see Amion for pager number to reach on-call Pulmonary and Critical Care Team.

## 2019-06-21 NOTE — Progress Notes (Signed)
CDS called

## 2019-06-21 NOTE — Procedures (Signed)
ELECTROENCEPHALOGRAM REPORT   Patient: Chelsea Hensley       Room #: [facility] EEG No. ID: [id number] Age: 74 y.o.        Sex: female Referring Physician: Loanne Drilling Report Date:  June 13, 2019        Interpreting Physician: Alexis Goodell  History: Chelsea Hensley is an 74 y.o. female s/p arrest  Medications:  Fentanyl, Propofol, Vasopressin, Zosyn   Conditions of Recording:  This is a 21 channel routine scalp EEG performed with bipolar and monopolar montages arranged in accordance to the international 10/20 system of electrode placement. One channel was dedicated to EKG recording.  The patient is in the intubated and sedated state.  Description:  The background activity is slow and poorly organized. It consists of a very low voltage, very slow polymorphic delta activity.  This slow activity is diffusely distributed and continuous.   No epileptiform activity is noted.   The patient is stimulated during the recording with no change in the background rhythm noted.   Hyperventilation and intermittent photic stimulation were not performed.   IMPRESSION: This is an abnormal EEG secondary to general background slowing.  This finding may be seen with a diffuse cerebral disturbance that is etiologically nonspecific, but may include a metabolic encephalopathy or medication effect, among other possibilities.  No epileptiform activity was noted.     Alexis Goodell, MD Neurology [phone] 06/13/19, 11:54 AM

## 2019-06-21 NOTE — ED Notes (Signed)
CCM at bedside 

## 2019-06-21 NOTE — Progress Notes (Signed)
LTM EEG hooked up and running - no initial skin breakdown - push button tested - neuro notified.

## 2019-06-21 NOTE — ED Notes (Signed)
RT at bedside for ABG

## 2019-06-21 DEATH — deceased
